# Patient Record
Sex: Male | Born: 1955 | Race: Black or African American | Hispanic: No | Marital: Married | Smoking: Former smoker
Health system: Southern US, Community
[De-identification: ages and names within clinical notes are randomized; demographics above are authoritative.]

## PROBLEM LIST (undated history)

## (undated) DIAGNOSIS — K219 Gastro-esophageal reflux disease without esophagitis: Secondary | ICD-10-CM

## (undated) DIAGNOSIS — M109 Gout, unspecified: Secondary | ICD-10-CM

## (undated) DIAGNOSIS — M791 Myalgia, unspecified site: Secondary | ICD-10-CM

## (undated) DIAGNOSIS — Z973 Presence of spectacles and contact lenses: Secondary | ICD-10-CM

## (undated) DIAGNOSIS — M3390 Dermatopolymyositis, unspecified, organ involvement unspecified: Secondary | ICD-10-CM

## (undated) DIAGNOSIS — E119 Type 2 diabetes mellitus without complications: Secondary | ICD-10-CM

## (undated) DIAGNOSIS — D573 Sickle-cell trait: Secondary | ICD-10-CM

## (undated) DIAGNOSIS — D649 Anemia, unspecified: Secondary | ICD-10-CM

## (undated) DIAGNOSIS — M3313 Other dermatomyositis without myopathy: Secondary | ICD-10-CM

## (undated) DIAGNOSIS — I10 Essential (primary) hypertension: Secondary | ICD-10-CM

## (undated) HISTORY — PX: SHOULDER SURGERY: SHX246

## (undated) HISTORY — PX: KNEE RECONSTRUCTION: SHX5883

## (undated) HISTORY — PX: VASECTOMY: SHX75

---

## 1999-12-29 ENCOUNTER — Encounter: Admission: RE | Admit: 1999-12-29 | Discharge: 2000-03-28 | Payer: Self-pay | Admitting: Internal Medicine

## 2000-04-28 ENCOUNTER — Encounter: Admission: RE | Admit: 2000-04-28 | Discharge: 2000-07-27 | Payer: Self-pay | Admitting: Internal Medicine

## 2001-06-21 ENCOUNTER — Encounter: Admission: RE | Admit: 2001-06-21 | Discharge: 2001-09-19 | Payer: Self-pay | Admitting: Occupational Medicine

## 2001-11-20 ENCOUNTER — Encounter: Payer: Self-pay | Admitting: Otolaryngology

## 2001-11-20 ENCOUNTER — Encounter: Admission: RE | Admit: 2001-11-20 | Discharge: 2001-11-20 | Payer: Self-pay | Admitting: Otolaryngology

## 2003-09-06 ENCOUNTER — Ambulatory Visit (HOSPITAL_COMMUNITY): Admission: RE | Admit: 2003-09-06 | Discharge: 2003-09-06 | Payer: Self-pay | Admitting: Gastroenterology

## 2008-06-27 ENCOUNTER — Emergency Department (HOSPITAL_COMMUNITY): Admission: EM | Admit: 2008-06-27 | Discharge: 2008-06-28 | Payer: Self-pay | Admitting: Emergency Medicine

## 2010-09-25 ENCOUNTER — Other Ambulatory Visit: Payer: Self-pay | Admitting: Neurosurgery

## 2010-10-07 ENCOUNTER — Other Ambulatory Visit: Payer: Self-pay | Admitting: Neurosurgery

## 2010-10-07 ENCOUNTER — Ambulatory Visit
Admission: RE | Admit: 2010-10-07 | Discharge: 2010-10-07 | Disposition: A | Discharge status: Home / Self Care | Payer: 59 | Source: Ambulatory Visit | Attending: Neurosurgery | Admitting: Neurosurgery

## 2010-10-07 DIAGNOSIS — M549 Dorsalgia, unspecified: Secondary | ICD-10-CM

## 2011-01-08 NOTE — Consult Note (Signed)
[place]. [facility]  Patient:    Derek Anthony, Derek Anthony                       MRN: [id number] Proc. Date: 12/29/99 Adm. Date:  [id number] Attending:  Garlan Fillers CC:         Lennon Alstrom. Felipa Eth, M.D.                          Consultation Report  PHYSICIAN REQUESTING CONSULTATION:  Ravi R. Felipa Eth, M.D.  REASON FOR CONSULTATION:  Numbness along the left great toe.  HISTORY OF PRESENT ILLNESS:  The patient is a 55 year old black male with a history of diabetes mellitus, type 2, since October 1998.  He notes his blood sugars are often in the 200 range.  He had a hemoglobin A1C tested in October 2000 that was 7.8%.  PAST MEDICAL HISTORY:  He has no history of peripheral neuropathy or retinopathy.  He also has a history of hypertension, hiatal hernia, osteoarthritis affecting both knees, gout, and pancreatitis.  PAST SURGICAL HISTORY:  Significant for right total knee reconstruction in 1982 and a 1986 vasectomy.  ALLERGIES:  PENICILLIN and SULFA.  MEDICATIONS: 1. Glucotrol XL 10 mg p.o. b.i.d. 2. Actos 30 mg p.o. q.d. 3. Celebrex 200 mg p.o. q.d. 4. Lisinopril 10 mg p.o. q.d. 5. Zyrtec 10 mg p.o. q.d. 6. Allopurinol 300 mg p.o. q.d. 7. Zantac 150 mg p.o. q.d. 8. Insulin Lente 50 units subcutaneously b.i.d. 9. Vasocon A eye drops p.r.n.  PHYSICAL EXAMINATION:  EXTREMITIES:  Both feet have fairly normal shape with the exception of mild bunion deformities bilaterally.  There was normal sensation to 5.07 g monofilament testing bilaterally.  The exception if a very small patch of skin on the dorsum of the left great toe that does not have monofilament sensation. The pulses are normal.  There is deep tenderness in the plantar fascia on the left.  There is no evidence of a compartment syndrome causing numbness of the left great toe.  He shows normal toe walk and heel walk, indicating intact motor function of the S1 and L5 nerve roots.  There is no evidence of  tarsal tunnel syndrome.  IMPRESSION: 1. Left foot pain of uncertain etiology, possibly due to early diabetic    neuropathy.  The numb area alternatively could be due to a softball injury    to his left shin approximately one year ago that could affect the sensory    enervation to this region. 2. Diabetes mellitus, type 2, under average control. 3. Hypertension. 4. Osteoarthritis of both knees.  PLAN: 1. He was advised to wear heel cups to minimize plantar fascia pain on    the left.  In addition, he will do plantar fascia stretches twice daily. 2. If conservative treatment for left-sided heel pain is ineffective, he    will consider cortisone injection in this region. 3. For the left great toe numbness and pain, he was started on Neurontin    300 mg p.o. q.h.s. 4. He will be seen in followup as needed [facility]. DD:  12/29/99 TD:  12/29/99 Job: [id number]

## 2011-01-08 NOTE — Op Note (Signed)
NAME:  Derek Anthony, Derek Anthony                          ACCOUNT NO.:  [id number]   MEDICAL RECORD NO.:  [id number]                   PATIENT TYPE:  AMB   LOCATION:  ENDO                                 FACILITY:  [facility]   PHYSICIAN:  Petra Kuba, M.D.                 DATE OF BIRTH:  15-May-1956   DATE OF PROCEDURE:  09/06/2003  DATE OF DISCHARGE:                                 OPERATIVE REPORT   PROCEDURE:  Colonoscopy.   INDICATION:  Family history of colon cancer in brother at a young age,  episodic right-sided abdominal pain, questionable etiology.  Consent was  signed after risks, benefits, methods, options thoroughly discussed in the  office.   MEDICINES USED:  1. Demerol 80.  2. Versed 8.   DESCRIPTION OF PROCEDURE:  Rectal inspection was pertinent for external  hemorrhoids.  Digital exam was negative.  The video colonoscope was  inserted, easily advanced to the level of the ileocecal valve.  Unfortunately, with the looping to advance to the cecal pole required  rolling him on his back and then on his right side.  The cecum was  identified by the appendiceal orifice and the ileocecal valve.  In fact, the  scope was inserted a short ways into the terminal ileum which was normal.  Photodocumentation was obtained.  The scope was slowly withdrawn.  Prep was  adequate.  There was some liquid stool that required washing and suctioning  but on slow withdrawal through the colon, no abnormalities were seen.  Specifically, no polyps, tumors, masses, diverticula, signs of colitis, etc.  Anorectal pull-through and retroflexion confirmed some small hemorrhoids.  The scope was reinserted a short ways up the left side of the colon; air was  suctioned, scope removed.  The patient tolerated the procedure well.  There  was no obvious immediate complication.   ENDOSCOPIC DIAGNOSES:  1. Internal-external hemorrhoids.  2. Otherwise, within normal limits to the terminal ileum.   PLAN:  1. Re-screen  in 5 years.  2. Yearly rectals with guaiacs per Dr. Felipa Eth.  3. Consider a CAT scan next if his pain continues just to be sure.  4. Might want to try an antispasmodic, but the pain seems to be so difficult     to describe, fairly rare, and short-lived, I doubt a medicine would     prevent these discomforts but happy to see back to further evaluate,     etc., as needed.                                               Petra Kuba, M.D.    MEM/MEDQ  D:  09/06/2003  T:  09/06/2003  Job:  [id number]   cc:   Larina Earthly, M.D.  [address]  [place]  [place] [address]  Fax: [phone]

## 2011-03-10 ENCOUNTER — Emergency Department (HOSPITAL_COMMUNITY)
Admission: EM | Admit: 2011-03-10 | Discharge: 2011-03-11 | Disposition: A | Discharge status: Home / Self Care | Payer: 59 | Attending: Emergency Medicine | Admitting: Emergency Medicine

## 2011-03-10 DIAGNOSIS — E119 Type 2 diabetes mellitus without complications: Secondary | ICD-10-CM | POA: Insufficient documentation

## 2011-03-10 DIAGNOSIS — Z794 Long term (current) use of insulin: Secondary | ICD-10-CM | POA: Insufficient documentation

## 2011-03-10 DIAGNOSIS — I10 Essential (primary) hypertension: Secondary | ICD-10-CM | POA: Insufficient documentation

## 2011-03-10 DIAGNOSIS — T63461A Toxic effect of venom of wasps, accidental (unintentional), initial encounter: Secondary | ICD-10-CM | POA: Insufficient documentation

## 2011-03-10 DIAGNOSIS — R51 Headache: Secondary | ICD-10-CM | POA: Insufficient documentation

## 2011-03-10 DIAGNOSIS — M129 Arthropathy, unspecified: Secondary | ICD-10-CM | POA: Insufficient documentation

## 2011-03-10 DIAGNOSIS — T6391XA Toxic effect of contact with unspecified venomous animal, accidental (unintentional), initial encounter: Secondary | ICD-10-CM | POA: Insufficient documentation

## 2011-03-10 DIAGNOSIS — M79609 Pain in unspecified limb: Secondary | ICD-10-CM | POA: Insufficient documentation

## 2011-03-10 DIAGNOSIS — Z79899 Other long term (current) drug therapy: Secondary | ICD-10-CM | POA: Insufficient documentation

## 2011-03-10 LAB — GLUCOSE, CAPILLARY: Glucose-Capillary: 246 mg/dL — ABNORMAL HIGH (ref 70–99)

## 2013-04-25 ENCOUNTER — Other Ambulatory Visit: Payer: Self-pay | Admitting: Neurosurgery

## 2013-04-25 DIAGNOSIS — M545 Low back pain: Secondary | ICD-10-CM

## 2013-04-25 DIAGNOSIS — M25552 Pain in left hip: Secondary | ICD-10-CM

## 2013-05-02 ENCOUNTER — Other Ambulatory Visit: Payer: 59

## 2013-05-19 ENCOUNTER — Other Ambulatory Visit: Payer: 59

## 2013-05-19 ENCOUNTER — Ambulatory Visit
Admission: RE | Admit: 2013-05-19 | Discharge: 2013-05-19 | Disposition: A | Discharge status: Home / Self Care | Payer: 59 | Source: Ambulatory Visit | Attending: Neurosurgery | Admitting: Neurosurgery

## 2013-05-19 DIAGNOSIS — M545 Low back pain: Secondary | ICD-10-CM

## 2013-05-19 DIAGNOSIS — M25552 Pain in left hip: Secondary | ICD-10-CM

## 2013-05-21 ENCOUNTER — Emergency Department (HOSPITAL_COMMUNITY)
Admission: EM | Admit: 2013-05-21 | Discharge: 2013-05-21 | Disposition: A | Discharge status: Home / Self Care | Payer: 59 | Attending: Emergency Medicine | Admitting: Emergency Medicine

## 2013-05-21 ENCOUNTER — Encounter (HOSPITAL_COMMUNITY): Payer: Self-pay | Admitting: Nurse Practitioner

## 2013-05-21 DIAGNOSIS — R05 Cough: Secondary | ICD-10-CM | POA: Insufficient documentation

## 2013-05-21 DIAGNOSIS — F438 Other reactions to severe stress: Secondary | ICD-10-CM | POA: Insufficient documentation

## 2013-05-21 DIAGNOSIS — Z794 Long term (current) use of insulin: Secondary | ICD-10-CM | POA: Insufficient documentation

## 2013-05-21 DIAGNOSIS — R739 Hyperglycemia, unspecified: Secondary | ICD-10-CM

## 2013-05-21 DIAGNOSIS — Z882 Allergy status to sulfonamides status: Secondary | ICD-10-CM | POA: Insufficient documentation

## 2013-05-21 DIAGNOSIS — Z7982 Long term (current) use of aspirin: Secondary | ICD-10-CM | POA: Insufficient documentation

## 2013-05-21 DIAGNOSIS — N289 Disorder of kidney and ureter, unspecified: Secondary | ICD-10-CM | POA: Insufficient documentation

## 2013-05-21 DIAGNOSIS — Z79899 Other long term (current) drug therapy: Secondary | ICD-10-CM | POA: Insufficient documentation

## 2013-05-21 DIAGNOSIS — I10 Essential (primary) hypertension: Secondary | ICD-10-CM | POA: Insufficient documentation

## 2013-05-21 DIAGNOSIS — R059 Cough, unspecified: Secondary | ICD-10-CM | POA: Insufficient documentation

## 2013-05-21 DIAGNOSIS — Z880 Allergy status to penicillin: Secondary | ICD-10-CM | POA: Insufficient documentation

## 2013-05-21 DIAGNOSIS — E119 Type 2 diabetes mellitus without complications: Secondary | ICD-10-CM | POA: Insufficient documentation

## 2013-05-21 DIAGNOSIS — R109 Unspecified abdominal pain: Secondary | ICD-10-CM | POA: Insufficient documentation

## 2013-05-21 DIAGNOSIS — F4389 Other reactions to severe stress: Secondary | ICD-10-CM | POA: Insufficient documentation

## 2013-05-21 HISTORY — DX: Type 2 diabetes mellitus without complications: E11.9

## 2013-05-21 HISTORY — DX: Essential (primary) hypertension: I10

## 2013-05-21 LAB — COMPREHENSIVE METABOLIC PANEL
ALT: 19 U/L (ref 0–53)
AST: 19 U/L (ref 0–37)
Alkaline Phosphatase: 110 U/L (ref 39–117)
CO2: 27 mEq/L (ref 19–32)
Calcium: 9.4 mg/dL (ref 8.4–10.5)
Chloride: 96 mEq/L (ref 96–112)
GFR calc Af Amer: 40 mL/min — ABNORMAL LOW (ref >90)
GFR calc non Af Amer: 35 mL/min — ABNORMAL LOW (ref >90)
Sodium: 136 mEq/L (ref 135–145)
Total Bilirubin: 0.5 mg/dL (ref 0.3–1.2)

## 2013-05-21 LAB — GLUCOSE, CAPILLARY

## 2013-05-21 LAB — CBC
Hemoglobin: 14.1 g/dL (ref 13.0–17.0)
MCH: 27.2 pg (ref 26.0–34.0)
MCV: 79 fL (ref 78.0–100.0)
Platelets: 288 10*3/uL (ref 150–400)
RBC: 5.18 MIL/uL (ref 4.22–5.81)
RDW: 13.1 % (ref 11.5–15.5)
WBC: 11.6 10*3/uL — ABNORMAL HIGH (ref 4.0–10.5)

## 2013-05-21 LAB — URINALYSIS, ROUTINE W REFLEX MICROSCOPIC
Hgb urine dipstick: NEGATIVE
Protein, ur: NEGATIVE mg/dL
Specific Gravity, Urine: 1.011 (ref 1.005–1.030)
Urobilinogen, UA: 0.2 mg/dL (ref 0.0–1.0)

## 2013-05-21 NOTE — ED Notes (Signed)
Pt reports he has been unable to control blood sugar for past month. Pt is on oral and SQ medications. Today his blood sugar was >400 on home monitor. Pt reports he is under a lot of stress and he thinks that is why. C/o blurred vision, weakness, frequent urination for past weeks. A&ox4, resp /u

## 2013-05-21 NOTE — ED Provider Notes (Signed)
CSN: [id number]     Arrival date & time 05/21/13  1713 History   First MD Initiated Contact with Patient 05/21/13 2151     Chief Complaint  Patient presents with  . Hyperglycemia   (Consider location/radiation/quality/duration/timing/severity/associated sxs/prior Treatment) HPI History provided by pt.   Pt presents w/ hyperglycemia since last night.  Monitor read "ketones" last night, 390 after fasting this morning, and >400 at 1pm after eating a bowl of oatmeal and coffee with creamer and taking novolog a few hours prior.  Cbg checked daily, is typically in 140s and most recent A1C was ~7.0 3-4 months ago.   Has been compliant w/ novolog and actos but missed his dose of glipizide 4 and 5 days ago.  No recent infectious symptoms, w/ exception of mild cough and chest congestion that is typical and attributed to allergies.  Denies fever, CP/SOB, abd pain, N/V/D, urinary sx.  Has been under a lot of stress at work recently.  Past Medical History  Diagnosis Date  . Diabetes mellitus without complication   . Hypertension    Past Surgical History  Procedure Laterality Date  . Shoulder surgery    . Knee reconstruction    . Vasectomy     History reviewed. No pertinent family history. History  Substance Use Topics  . Smoking status: Never Smoker   . Smokeless tobacco: Not on file  . Alcohol Use: No    Review of Systems  All other systems reviewed and are negative.    Allergies  Penicillins and Sulfa antibiotics  Home Medications   Current Outpatient Rx  Name  Route  Sig  Dispense  Refill  . allopurinol (ZYLOPRIM) 300 MG tablet   Oral   Take 300 mg by mouth daily.         Marland Kitchen aspirin EC 81 MG tablet   Oral   Take 81 mg by mouth daily.         . cetirizine (ZYRTEC) 10 MG tablet   Oral   Take 10 mg by mouth every morning.         . diazepam (VALIUM) 5 MG tablet   Oral   Take 5 mg by mouth every 8 (eight) hours as needed for anxiety or sleep.         Marland Kitchen gabapentin  (NEURONTIN) 600 MG tablet   Oral   Take 600 mg by mouth 2 (two) times daily.         Marland Kitchen glipiZIDE (GLUCOTROL) 10 MG tablet   Oral   Take 10 mg by mouth daily.         Marland Kitchen HYDROcodone-acetaminophen (NORCO) 10-325 MG per tablet   Oral   Take 1 tablet by mouth every 6 (six) hours as needed for pain.         Marland Kitchen insulin aspart protamine- aspart (NOVOLOG MIX 70/30) (70-30) 100 UNIT/ML injection   Subcutaneous   Inject 20-30 Units into the skin 2 (two) times daily. 30 units in the morning and 20 units at bedtime.         . montelukast (SINGULAIR) 10 MG tablet   Oral   Take 10 mg by mouth at bedtime.         Marland Kitchen olmesartan-hydrochlorothiazide (BENICAR HCT) 40-12.5 MG per tablet   Oral   Take 1 tablet by mouth daily.         . pioglitazone (ACTOS) 45 MG tablet   Oral   Take 45 mg by mouth daily.         Marland Kitchen  ranitidine (ZANTAC) 150 MG tablet   Oral   Take 150 mg by mouth daily.         . simvastatin (ZOCOR) 80 MG tablet   Oral   Take 80 mg by mouth at bedtime.         . traMADol (ULTRAM) 50 MG tablet   Oral   Take 50 mg by mouth every 6 (six) hours as needed for pain.          BP 111/82  Pulse 86  Temp(Src) 98 F (36.7 C) (Oral)  Resp 16  Ht 6\' 1"  (1.854 m)  Wt 255 lb (115.667 kg)  BMI 33.65 kg/m2  SpO2 95% Physical Exam  Nursing note and vitals reviewed. Constitutional: He is oriented to person, place, and time. He appears well-developed and well-nourished. No distress.  HENT:  Head: Normocephalic and atraumatic.  Eyes:  Normal appearance  Neck: Normal range of motion.  Cardiovascular: Normal rate and regular rhythm.   Pulmonary/Chest: Effort normal and breath sounds normal. No respiratory distress.  Abdominal: Soft. Bowel sounds are normal. He exhibits no distension and no mass. There is no rebound and no guarding.  Mild tenderness across upper abd  Genitourinary:  No CVA tenderness  Musculoskeletal: Normal range of motion.  Neurological: He is  alert and oriented to person, place, and time.  Skin: Skin is warm and dry. No rash noted.  Psychiatric: He has a normal mood and affect. His behavior is normal.    ED Course  Procedures (including critical care time) Labs Review Labs Reviewed  CBC - Abnormal; Notable for the following:    WBC 11.6 (*)    All other components within normal limits  COMPREHENSIVE METABOLIC PANEL - Abnormal; Notable for the following:    Glucose, Bld 277 (*)    BUN 28 (*)    Creatinine, Ser 2.00 (*)    GFR calc non Af Amer 35 (*)    GFR calc Af Amer 40 (*)    All other components within normal limits  GLUCOSE, CAPILLARY - Abnormal; Notable for the following:    Glucose-Capillary 263 (*)    All other components within normal limits  GLUCOSE, CAPILLARY - Abnormal; Notable for the following:    Glucose-Capillary 118 (*)    All other components within normal limits  URINALYSIS, ROUTINE W REFLEX MICROSCOPIC   Imaging Review No results found.  MDM   1. Hyperglycemia   2. Renal insufficiency    57yo diabetic M presents w/ hyperglycemia.  Cbg 263 upon arrival; was in 300-400s at home which is atypical.  Missed glipize two days in a row at the end of last week and has been under a lot of stress at work.  No recent infectious symptoms; U/A negative for infection.  He is not acidotic and labs otherwise sig for elevated creatinine.  Discussed all results with patient.  He is followed by Dr. Felipa Eth, reports that he has been diagnosed w/ "mild renal failure" and that he his kidney function is being monitored.  Advised him to continue all medications as prescribed and contact Dr. Vicente Males office tomorrow for f/u.  10:36 PM     Otilio Miu, PA-C 05/21/13 2238

## 2013-05-21 NOTE — ED Notes (Signed)
Pt to ED for evaluation of fluctuation blood sugars over the past 6-8 weeks.  Pt states his CBG has been higher than he usually runs- blurred vision, headaches, and frequent urination associated with symptoms.  Last night pt blood sugar was too high for machine to read and has been reading high today as well.  Pt admits to being complaint with medications and insulin.

## 2013-05-23 NOTE — ED Provider Notes (Signed)
Medical screening examination/treatment/procedure(s) were performed by non-physician practitioner and as supervising physician I was immediately available for consultation/collaboration.  Hurman Horn, MD 05/23/13 [phone]

## 2013-06-22 ENCOUNTER — Other Ambulatory Visit: Payer: Self-pay | Admitting: Internal Medicine

## 2013-06-26 ENCOUNTER — Ambulatory Visit
Admission: RE | Admit: 2013-06-26 | Discharge: 2013-06-26 | Disposition: A | Discharge status: Home / Self Care | Payer: 59 | Source: Ambulatory Visit | Attending: Internal Medicine | Admitting: Internal Medicine

## 2014-10-26 ENCOUNTER — Emergency Department (HOSPITAL_COMMUNITY): Payer: 59

## 2014-10-26 ENCOUNTER — Inpatient Hospital Stay (HOSPITAL_COMMUNITY): Payer: 59

## 2014-10-26 ENCOUNTER — Encounter (HOSPITAL_COMMUNITY): Payer: Self-pay | Admitting: Physical Medicine and Rehabilitation

## 2014-10-26 ENCOUNTER — Inpatient Hospital Stay (HOSPITAL_COMMUNITY)
Admission: EM | Admit: 2014-10-26 | Discharge: 2014-11-01 | DRG: 607 | Disposition: A | Discharge status: Home / Self Care | Payer: 59 | Attending: Internal Medicine | Admitting: Internal Medicine

## 2014-10-26 DIAGNOSIS — R682 Dry mouth, unspecified: Secondary | ICD-10-CM | POA: Diagnosis not present

## 2014-10-26 DIAGNOSIS — M25512 Pain in left shoulder: Secondary | ICD-10-CM | POA: Diagnosis present

## 2014-10-26 DIAGNOSIS — J029 Acute pharyngitis, unspecified: Secondary | ICD-10-CM | POA: Diagnosis present

## 2014-10-26 DIAGNOSIS — I951 Orthostatic hypotension: Secondary | ICD-10-CM | POA: Diagnosis not present

## 2014-10-26 DIAGNOSIS — R042 Hemoptysis: Secondary | ICD-10-CM

## 2014-10-26 DIAGNOSIS — Z9852 Vasectomy status: Secondary | ICD-10-CM | POA: Diagnosis not present

## 2014-10-26 DIAGNOSIS — E1165 Type 2 diabetes mellitus with hyperglycemia: Secondary | ICD-10-CM | POA: Diagnosis present

## 2014-10-26 DIAGNOSIS — R509 Fever, unspecified: Secondary | ICD-10-CM | POA: Diagnosis present

## 2014-10-26 DIAGNOSIS — Z794 Long term (current) use of insulin: Secondary | ICD-10-CM | POA: Diagnosis not present

## 2014-10-26 DIAGNOSIS — R05 Cough: Secondary | ICD-10-CM | POA: Diagnosis present

## 2014-10-26 DIAGNOSIS — D573 Sickle-cell trait: Secondary | ICD-10-CM | POA: Diagnosis present

## 2014-10-26 DIAGNOSIS — E119 Type 2 diabetes mellitus without complications: Secondary | ICD-10-CM

## 2014-10-26 DIAGNOSIS — M25531 Pain in right wrist: Secondary | ICD-10-CM | POA: Diagnosis not present

## 2014-10-26 DIAGNOSIS — Z87891 Personal history of nicotine dependence: Secondary | ICD-10-CM

## 2014-10-26 DIAGNOSIS — R630 Anorexia: Secondary | ICD-10-CM | POA: Diagnosis present

## 2014-10-26 DIAGNOSIS — K219 Gastro-esophageal reflux disease without esophagitis: Secondary | ICD-10-CM | POA: Diagnosis present

## 2014-10-26 DIAGNOSIS — E1310 Other specified diabetes mellitus with ketoacidosis without coma: Secondary | ICD-10-CM | POA: Diagnosis not present

## 2014-10-26 DIAGNOSIS — M1990 Unspecified osteoarthritis, unspecified site: Secondary | ICD-10-CM | POA: Diagnosis present

## 2014-10-26 DIAGNOSIS — R0789 Other chest pain: Secondary | ICD-10-CM | POA: Diagnosis not present

## 2014-10-26 DIAGNOSIS — Z880 Allergy status to penicillin: Secondary | ICD-10-CM

## 2014-10-26 DIAGNOSIS — R0902 Hypoxemia: Secondary | ICD-10-CM | POA: Diagnosis not present

## 2014-10-26 DIAGNOSIS — R21 Rash and other nonspecific skin eruption: Secondary | ICD-10-CM | POA: Diagnosis present

## 2014-10-26 DIAGNOSIS — M25511 Pain in right shoulder: Secondary | ICD-10-CM | POA: Diagnosis present

## 2014-10-26 DIAGNOSIS — R634 Abnormal weight loss: Secondary | ICD-10-CM

## 2014-10-26 DIAGNOSIS — M2550 Pain in unspecified joint: Secondary | ICD-10-CM | POA: Insufficient documentation

## 2014-10-26 DIAGNOSIS — D649 Anemia, unspecified: Secondary | ICD-10-CM | POA: Diagnosis not present

## 2014-10-26 DIAGNOSIS — I10 Essential (primary) hypertension: Secondary | ICD-10-CM

## 2014-10-26 DIAGNOSIS — R059 Cough, unspecified: Secondary | ICD-10-CM

## 2014-10-26 DIAGNOSIS — M109 Gout, unspecified: Secondary | ICD-10-CM | POA: Diagnosis present

## 2014-10-26 DIAGNOSIS — Z882 Allergy status to sulfonamides status: Secondary | ICD-10-CM | POA: Diagnosis not present

## 2014-10-26 DIAGNOSIS — M25532 Pain in left wrist: Secondary | ICD-10-CM | POA: Diagnosis not present

## 2014-10-26 HISTORY — DX: Gastro-esophageal reflux disease without esophagitis: K21.9

## 2014-10-26 HISTORY — DX: Sickle-cell trait: D57.3

## 2014-10-26 HISTORY — DX: Gout, unspecified: M10.9

## 2014-10-26 HISTORY — DX: Anemia, unspecified: D64.9

## 2014-10-26 LAB — COMPREHENSIVE METABOLIC PANEL
ALBUMIN: 2.9 g/dL — AB (ref 3.5–5.2)
ALT: 24 U/L (ref 0–53)
AST: 35 U/L (ref 0–37)
Alkaline Phosphatase: 56 U/L (ref 39–117)
Anion gap: 4 — ABNORMAL LOW (ref 5–15)
BILIRUBIN TOTAL: 1 mg/dL (ref 0.3–1.2)
BUN: 10 mg/dL (ref 6–23)
CO2: 32 mmol/L (ref 19–32)
CREATININE: 1.3 mg/dL (ref 0.50–1.35)
Calcium: 8.8 mg/dL (ref 8.4–10.5)
Chloride: 106 mmol/L (ref 96–112)
GFR calc Af Amer: 67 mL/min — ABNORMAL LOW (ref >90)
GFR calc non Af Amer: 58 mL/min — ABNORMAL LOW (ref >90)
GLUCOSE: 162 mg/dL — AB (ref 70–99)
Potassium: 4.1 mmol/L (ref 3.5–5.1)
SODIUM: 142 mmol/L (ref 135–145)
Total Protein: 6.8 g/dL (ref 6.0–8.3)

## 2014-10-26 LAB — CBC WITH DIFFERENTIAL/PLATELET
BASOS PCT: 0 % (ref 0–1)
Basophils Absolute: 0 10*3/uL (ref 0.0–0.1)
Eosinophils Absolute: 0 10*3/uL (ref 0.0–0.7)
Eosinophils Relative: 0 % (ref 0–5)
HCT: 39.5 % (ref 39.0–52.0)
HEMOGLOBIN: 12.9 g/dL — AB (ref 13.0–17.0)
LYMPHS ABS: 0.9 10*3/uL (ref 0.7–4.0)
Lymphocytes Relative: 13 % (ref 12–46)
MCH: 25.9 pg — AB (ref 26.0–34.0)
MCHC: 32.7 g/dL (ref 30.0–36.0)
MCV: 79.2 fL (ref 78.0–100.0)
Monocytes Absolute: 0.5 10*3/uL (ref 0.1–1.0)
Monocytes Relative: 7 % (ref 3–12)
NEUTROS PCT: 80 % — AB (ref 43–77)
Neutro Abs: 5.8 10*3/uL (ref 1.7–7.7)
PLATELETS: 281 10*3/uL (ref 150–400)
RBC: 4.99 MIL/uL (ref 4.22–5.81)
RDW: 13.8 % (ref 11.5–15.5)
WBC: 7.3 10*3/uL (ref 4.0–10.5)

## 2014-10-26 LAB — PROTIME-INR
INR: 1.03 (ref 0.00–1.49)
PROTHROMBIN TIME: 13.6 s (ref 11.6–15.2)

## 2014-10-26 LAB — RAPID HIV SCREEN (WH-MAU): SUDS RAPID HIV SCREEN: NONREACTIVE

## 2014-10-26 LAB — SEDIMENTATION RATE: Sed Rate: 60 mm/hr — ABNORMAL HIGH (ref 0–16)

## 2014-10-26 LAB — CK: Total CK: 227 U/L (ref 7–232)

## 2014-10-26 LAB — LACTATE DEHYDROGENASE: LDH: 296 U/L — ABNORMAL HIGH (ref 94–250)

## 2014-10-26 MED ORDER — IOHEXOL 300 MG/ML  SOLN
100.0000 mL | Freq: Once | INTRAMUSCULAR | Status: AC | PRN
  Administered 2014-10-26: 100 mL via INTRAVENOUS

## 2014-10-26 MED ORDER — ACETAMINOPHEN 500 MG PO TABS
1000.0000 mg | ORAL_TABLET | Freq: Once | ORAL | Status: AC
  Administered 2014-10-26: 1000 mg via ORAL
  Filled 2014-10-26: qty 2

## 2014-10-26 MED ORDER — MORPHINE SULFATE 4 MG/ML IJ SOLN
4.0000 mg | Freq: Once | INTRAMUSCULAR | Status: AC
  Administered 2014-10-26: 4 mg via INTRAVENOUS
  Filled 2014-10-26: qty 1

## 2014-10-26 MED ORDER — SODIUM CHLORIDE 0.9 % IV BOLUS (SEPSIS)
1000.0000 mL | Freq: Once | INTRAVENOUS | Status: AC
  Administered 2014-10-26: 1000 mL via INTRAVENOUS

## 2014-10-26 MED ORDER — IOHEXOL 300 MG/ML  SOLN
25.0000 mL | Freq: Once | INTRAMUSCULAR | Status: AC | PRN
  Administered 2014-10-26: 25 mL via ORAL

## 2014-10-26 NOTE — Progress Notes (Signed)
Partial report of patient received from [facility] in ED. Pt to go to CT and RN would return call.

## 2014-10-26 NOTE — ED Notes (Signed)
Pt presents to department for evaluation of fever, generalized body aches and blisters to hands/feet. Ongoing for several weeks. Pt is alert and oriented x4.

## 2014-10-26 NOTE — ED Provider Notes (Signed)
Patient presented to the ER with rash, malaise, body aches. Patient has been experiencing fever and chills. Symptoms have been ongoing for several weeks. He saw an allergist for this, tells me that the workup is ongoing. Family reports that he has been significantly change. He is normally very active, but has been simply lying around all day.  Face to face Exam: HEENT - PERRLA Lungs - CTAB Heart - RRR, no M/R/G Abd - S/NT/ND Neuro - alert, oriented x3 Skin - nonspecific erythematous rash on palms of hands and soles of feet  Plan: Basic labs will be obtained. Differential diagnosis would include viral exanthem, direct skin infection with bacteria as well as virus, specifically consider secondary syphilis. Would also consider vasculitis.   Gilda Creasehristopher J. Pollina, MD 10/26/14 [phone]

## 2014-10-26 NOTE — H&P (Addendum)
Derek Anthony is an 59 y.o. male.    Dr. Elsworth Soho (pcp, [facility]) Dr. Eliot Ford (allerghist)  Chief Complaint: rash HPI: 59 yo male with dm2, htn, gout, apparently c/o blisters on hands for the past 4-5 weeks.  Started on prednisone about 2 weeks ago.  Pt also notes blisters on the bilateral elbows.  + fever for the past couple of weeks.  Pt had dental work about 5-6 weeks ago.  Pt states that fever has been about 101.  Apparently ED consult ID and gave recommendations to order RPR, ESR, and rocky mountain spotted fever titer.... See ED note for details.  Pt denies any new detergent, lotion or soap.  Pt denies new medications prior to the rash.  Pt will be admitted for fever unclear source r/o endocarditis.  Past Medical History  Diagnosis Date  . Diabetes mellitus without complication   . Hypertension   . GERD (gastroesophageal reflux disease)   . Anemia   . Sickle cell trait   . Gout     Past Surgical History  Procedure Laterality Date  . Shoulder surgery    . Knee reconstruction Right   . Vasectomy      Family History  Problem Relation Age of Onset  . Hypertension Mother   . Diabetes Mother   . Hypertension Father    Social History:  reports that he has never smoked. He does not have any smokeless tobacco history on file. He reports that he does not drink alcohol or use illicit drugs.  Allergies:  Allergies  Allergen Reactions  . Sulfa Antibiotics Itching and Rash    Burning also  . Penicillins Itching and Rash   Medications reviewed  (Not in a hospital admission)  Results for orders placed or performed during the hospital encounter of 10/26/14 (from the past 48 hour(s))  CBC with Differential     Status: Abnormal   Collection Time: 10/26/14  2:08 PM  Result Value Ref Range   WBC 7.3 4.0 - 10.5 K/uL   RBC 4.99 4.22 - 5.81 MIL/uL   Hemoglobin 12.9 (L) 13.0 - 17.0 g/dL   HCT 39.5 39.0 - 52.0 %   MCV 79.2 78.0 - 100.0 fL   MCH 25.9 (L) 26.0 - 34.0 pg   MCHC  32.7 30.0 - 36.0 g/dL   RDW 13.8 11.5 - 15.5 %   Platelets 281 150 - 400 K/uL   Neutrophils Relative % 80 (H) 43 - 77 %   Neutro Abs 5.8 1.7 - 7.7 K/uL   Lymphocytes Relative 13 12 - 46 %   Lymphs Abs 0.9 0.7 - 4.0 K/uL   Monocytes Relative 7 3 - 12 %   Monocytes Absolute 0.5 0.1 - 1.0 K/uL   Eosinophils Relative 0 0 - 5 %   Eosinophils Absolute 0.0 0.0 - 0.7 K/uL   Basophils Relative 0 0 - 1 %   Basophils Absolute 0.0 0.0 - 0.1 K/uL  Comprehensive metabolic panel     Status: Abnormal   Collection Time: 10/26/14  2:08 PM  Result Value Ref Range   Sodium 142 135 - 145 mmol/L   Potassium 4.1 3.5 - 5.1 mmol/L   Chloride 106 96 - 112 mmol/L   CO2 32 19 - 32 mmol/L   Glucose, Bld 162 (H) 70 - 99 mg/dL   BUN 10 6 - 23 mg/dL   Creatinine, Ser 1.30 0.50 - 1.35 mg/dL   Calcium 8.8 8.4 - 10.5 mg/dL   Total Protein 6.8  6.0 - 8.3 g/dL   Albumin 2.9 (L) 3.5 - 5.2 g/dL   AST 35 0 - 37 U/L   ALT 24 0 - 53 U/L   Alkaline Phosphatase 56 39 - 117 U/L   Total Bilirubin 1.0 0.3 - 1.2 mg/dL   GFR calc non Af Amer 58 (L) >90 mL/min   GFR calc Af Amer 67 (L) >90 mL/min    Comment: (NOTE) The eGFR has been calculated using the CKD EPI equation. This calculation has not been validated in all clinical situations. eGFR's persistently <90 mL/min signify possible Chronic Kidney Disease.    Anion gap 4 (L) 5 - 15  Sedimentation rate     Status: Abnormal   Collection Time: 10/26/14  2:08 PM  Result Value Ref Range   Sed Rate 60 (H) 0 - 16 mm/hr  Rapid HIV screen [facility])     Status: None   Collection Time: 10/26/14  7:16 PM  Result Value Ref Range   Rapid HIV Screen NON REACTIVE NON REACTIVE  Protime-INR     Status: None   Collection Time: 10/26/14  7:16 PM  Result Value Ref Range   Prothrombin Time 13.6 11.6 - 15.2 seconds   INR 1.03 0.00 - 1.49   Dg Chest 2 View  10/26/2014   CLINICAL DATA:  Fever.  Generalized body aches.  Diabetes.  Cough.  EXAM: CHEST  2 VIEW  COMPARISON:  None.  FINDINGS:  Lateral view degraded by patient arm position and mild motion. Midline trachea. Borderline cardiomegaly. Mediastinal contours otherwise within normal limits. No pleural effusion or pneumothorax. Clear lungs.  IMPRESSION: Borderline cardiomegaly, without acute disease.  Degraded lateral view.   Electronically Signed   By: Abigail Miyamoto M.D.   On: 10/26/2014 17:20    Review of Systems  Constitutional: Positive for fever and chills. Negative for weight loss, malaise/fatigue and diaphoresis.  HENT: Negative for congestion, ear discharge, ear pain, hearing loss, nosebleeds, sore throat and tinnitus.   Eyes: Negative for blurred vision, double vision, photophobia, pain, discharge and redness.  Respiratory: Positive for cough. Negative for hemoptysis, sputum production, shortness of breath, wheezing and stridor.   Cardiovascular: Negative for chest pain, palpitations, orthopnea, claudication, leg swelling and PND.  Gastrointestinal: Negative for heartburn, nausea, vomiting, abdominal pain, diarrhea, constipation, blood in stool and melena.  Genitourinary: Negative for dysuria, urgency, frequency, hematuria and flank pain.  Musculoskeletal: Positive for myalgias, back pain and joint pain. Negative for falls and neck pain.  Skin: Positive for rash. Negative for itching.  Neurological: Negative for dizziness, tingling, tremors, sensory change, speech change, focal weakness, seizures, loss of consciousness, weakness and headaches.  Endo/Heme/Allergies: Negative for environmental allergies and polydipsia. Does not bruise/bleed easily.  Psychiatric/Behavioral: Negative for depression, suicidal ideas, hallucinations, memory loss and substance abuse. The patient is not nervous/anxious and does not have insomnia.     Blood pressure 108/79, pulse 70, temperature 99 F (37.2 C), temperature source Oral, resp. rate 16, height $RemoveBe'6\' 2"'ipfSfoFGa$  (1.88 m), weight 104.327 kg (230 lb), SpO2 92 %. Physical Exam  Constitutional: He  is oriented to person, place, and time. He appears well-developed and well-nourished.  HENT:  Head: Normocephalic and atraumatic.  Mouth/Throat: No oropharyngeal exudate.  Eyes: Conjunctivae and EOM are normal. Pupils are equal, round, and reactive to light. No scleral icterus.  Neck: Normal range of motion. Neck supple. No JVD present. No tracheal deviation present. No thyromegaly present.  Cardiovascular: Normal rate and regular rhythm.  Exam reveals no gallop  and no friction rub.   No murmur heard. Respiratory: Effort normal and breath sounds normal. No respiratory distress. He has no wheezes. He has no rales. He exhibits no tenderness.  GI: Soft. Bowel sounds are normal. He exhibits no distension and no mass. There is no tenderness. There is no rebound and no guarding.  Musculoskeletal: Normal range of motion. He exhibits no edema or tenderness.  Lymphadenopathy:    He has no cervical adenopathy.  Neurological: He is alert and oriented to person, place, and time. He has normal reflexes. He displays normal reflexes. No cranial nerve deficit. He exhibits normal muscle tone. Coordination normal.  Skin: Skin is warm and dry. Rash noted. No erythema. No pallor.  + papules on the fingers and palm  Psychiatric: He has a normal mood and affect. His behavior is normal. Judgment and thought content normal.     Assessment/Plan Rash  ? Endocarditis ? vasculitis Check esr, crp, ana, rpr, rocky mountain spotted fever titer, lyme titer, hiv, hepatitis panel IgA, celiac panel, dermatitis herpetiformis  Fever Blood culture x2,  Check esr, check cardiac echo Start on empiric vanco, levaquin for now after blood culture obtained  Anemia Check cbc in am Check spep, upep  Hyperglycemia Check hga1c    Jani Gravel 10/26/2014, 10:39 PM

## 2014-10-26 NOTE — ED Provider Notes (Signed)
CSN: [id number]     Arrival date & time 10/26/14  1354 History   First MD Initiated Contact with Patient 10/26/14 1506     Chief Complaint  Patient presents with  . Generalized Body Aches  . Fever     (Consider location/radiation/quality/duration/timing/severity/associated sxs/prior Treatment) The history is provided by the patient and medical records. No language interpreter was used.     Derek Anthony is a 59 y.o. male  with a hx of IDDM, HTN, arthritis, gout presents to the Emergency Department complaining of gradual, persistent, progressively worsening entire body pain onset 5 weeks ago. Pt reports his symptoms began approx 5 weeks ago with cough and body aches.  He reports the rash began 4 weeks ago.  He has been Dr. Filbert Schilder (an allergist) has been testing for the rash.  He reports his intense body pain is what prompted the ER visit.  He reports that he also feels dehydrated.  He reports a significantly decreased appetite.  He reports body pain is much worse with palpation of any part of the body.  Pt reports taking tramadol for many years to treat his arthritis pain, but this is not helping.  Pt reports fevers at home, no higher than 100.5 which improved with the ibuprofen.  Associated symptoms include weight loss (25 lbs in the last 3 mos), night sweates, hemoptysis.  Pt reports travel to [place] in Oct 2015.  Pt reports hx of [place]; denies a Hx of TB.  Denies hx of chancre.  Reports he is UTD on all vaccines.  Pt reports wife has the coughing but without other symptoms and no hemoptysis.  No one in the home is a smoker.  Nothing makes his symptoms better or worse.  Pt denies headache, neck pain, chest pain, SOB, abd pain, N/V/D, weakness, dizziness, syncope, dysuria.     PCP: [facility]  Past Medical History  Diagnosis Date  . Diabetes mellitus without complication   . Hypertension    Past Surgical History  Procedure Laterality Date  . Shoulder  surgery    . Knee reconstruction    . Vasectomy     History reviewed. No pertinent family history. History  Substance Use Topics  . Smoking status: Never Smoker   . Smokeless tobacco: Not on file  . Alcohol Use: No    Review of Systems  Constitutional: Positive for fever, appetite change and fatigue. Negative for diaphoresis and unexpected weight change.  HENT: Negative for mouth sores.   Eyes: Negative for visual disturbance.  Respiratory: Negative for cough, chest tightness, shortness of breath and wheezing.   Cardiovascular: Negative for chest pain.  Gastrointestinal: Negative for nausea, vomiting, abdominal pain, diarrhea and constipation.  Endocrine: Negative for polydipsia, polyphagia and polyuria.  Genitourinary: Negative for dysuria, urgency, frequency and hematuria.  Musculoskeletal: Positive for myalgias and arthralgias. Negative for back pain and neck stiffness.  Skin: Positive for rash.  Allergic/Immunologic: Negative for immunocompromised state.  Neurological: Negative for syncope, light-headedness and headaches.  Hematological: Does not bruise/bleed easily.  Psychiatric/Behavioral: Negative for sleep disturbance. The patient is not nervous/anxious.       Allergies  Sulfa antibiotics and Penicillins  Home Medications   Prior to Admission medications   Medication Sig Start Date End Date Taking? Authorizing Provider  allopurinol (ZYLOPRIM) 300 MG tablet Take 300 mg by mouth daily.   Yes Historical Provider, MD  aspirin EC 81 MG tablet Take 81 mg by mouth daily.   Yes  Historical Provider, MD  cetirizine (ZYRTEC) 10 MG tablet Take 10 mg by mouth every morning.    Yes Historical Provider, MD  fluticasone (FLONASE) 50 MCG/ACT nasal spray Place 2 sprays into both nostrils daily.   Yes Historical Provider, MD  gabapentin (NEURONTIN) 600 MG tablet Take 600 mg by mouth 2 (two) times daily.   Yes Historical Provider, MD  insulin aspart protamine- aspart (NOVOLOG MIX  70/30) (70-30) 100 UNIT/ML injection Inject 20-30 Units into the skin 2 (two) times daily. 30 units in the morning and 20 units in the evening   Yes Historical Provider, MD  insulin glargine (LANTUS) 100 UNIT/ML injection Inject 25 Units into the skin at bedtime.   Yes Historical Provider, MD  levofloxacin (LEVAQUIN) 500 MG tablet Take 500 mg by mouth daily. Started 10/20/14, for 7 days ending 10/26/14 10/20/14  Yes Historical Provider, MD  montelukast (SINGULAIR) 10 MG tablet Take 10 mg by mouth at bedtime.   Yes Historical Provider, MD  olmesartan (BENICAR) 40 MG tablet Take 40 mg by mouth daily.   Yes Historical Provider, MD  olmesartan (BENICAR) 40 MG tablet Take 40 mg by mouth daily.   Yes Historical Provider, MD  predniSONE (DELTASONE) 5 MG tablet Take 5 mg by mouth daily. 10/18/14  Yes Historical Provider, MD  traMADol (ULTRAM) 50 MG tablet Take 100 mg by mouth at bedtime.    Yes Historical Provider, MD   BP 108/79 mmHg  Pulse 70  Temp(Src) 99 F (37.2 C) (Oral)  Resp 16  Ht 6\' 2"  (1.88 m)  Wt 230 lb (104.327 kg)  BMI 29.52 kg/m2  SpO2 92% Physical Exam  Constitutional: He is oriented to person, place, and time. He appears well-developed and well-nourished. No distress.  Awake, alert, nontoxic appearance  HENT:  Head: Normocephalic and atraumatic.  Right Ear: Tympanic membrane, external ear and ear canal normal.  Left Ear: Tympanic membrane, external ear and ear canal normal.  Nose: Nose normal. No epistaxis. Right sinus exhibits no maxillary sinus tenderness and no frontal sinus tenderness. Left sinus exhibits no maxillary sinus tenderness and no frontal sinus tenderness.  Mouth/Throat: Uvula is midline, oropharynx is clear and moist and mucous membranes are normal. Mucous membranes are not pale and not cyanotic. No oropharyngeal exudate, posterior oropharyngeal edema, posterior oropharyngeal erythema or tonsillar abscesses.  No oral lesions noted  Eyes: Conjunctivae are normal. Pupils  are equal, round, and reactive to light. No scleral icterus.  Neck: Normal range of motion and full passive range of motion without pain. Neck supple.  Cardiovascular: Normal rate, regular rhythm, normal heart sounds and intact distal pulses.   No murmur heard. Pulmonary/Chest: Effort normal and breath sounds normal. No stridor. No respiratory distress. He has no wheezes.  Equal chest expansion  Abdominal: Soft. Bowel sounds are normal. He exhibits no mass. There is no tenderness. There is no rebound and no guarding.  Musculoskeletal: Normal range of motion. He exhibits tenderness. He exhibits no edema.  TTP over the muscles and joints of the body without swelling, erythema, increased warmth or effusions of the joints noted.    Lymphadenopathy:    He has no cervical adenopathy.  Neurological: He is alert and oriented to person, place, and time.  Speech is clear and goal oriented Moves extremities without ataxia  Skin: Skin is warm and dry. Rash noted. He is not diaphoretic.  Annular, red/brown lesions to the palms and soles with TTP of the lesions and mils scaling;  Several similar lesions noted  to the face, but none on the trunk, back, abd, arms or legs  Psychiatric: He has a normal mood and affect.  Nursing note and vitals reviewed.   ED Course  Procedures (including critical care time) Labs Review Labs Reviewed  CBC WITH DIFFERENTIAL/PLATELET - Abnormal; Notable for the following:    Hemoglobin 12.9 (*)    MCH 25.9 (*)    Neutrophils Relative % 80 (*)    All other components within normal limits  COMPREHENSIVE METABOLIC PANEL - Abnormal; Notable for the following:    Glucose, Bld 162 (*)    Albumin 2.9 (*)    GFR calc non Af Amer 58 (*)    GFR calc Af Amer 67 (*)    Anion gap 4 (*)    All other components within normal limits  SEDIMENTATION RATE - Abnormal; Notable for the following:    Sed Rate 60 (*)    All other components within normal limits  CULTURE, BLOOD (ROUTINE X  2)  CULTURE, BLOOD (ROUTINE X 2)  RAPID HIV SCREEN ([facility])  PROTIME-INR  RPR  HIV ANTIBODY (ROUTINE TESTING)  C-REACTIVE PROTEIN  ROCKY MTN SPOTTED FVR ABS PNL(IGG+IGM)  URINALYSIS, ROUTINE W REFLEX MICROSCOPIC  LACTATE DEHYDROGENASE  CK    Imaging Review Dg Chest 2 View  10/26/2014   CLINICAL DATA:  Fever.  Generalized body aches.  Diabetes.  Cough.  EXAM: CHEST  2 VIEW  COMPARISON:  None.  FINDINGS: Lateral view degraded by patient arm position and mild motion. Midline trachea. Borderline cardiomegaly. Mediastinal contours otherwise within normal limits. No pleural effusion or pneumothorax. Clear lungs.  IMPRESSION: Borderline cardiomegaly, without acute disease.  Degraded lateral view.   Electronically Signed   By: Jeronimo Greaves M.D.   On: 10/26/2014 17:20     EKG Interpretation None      MDM   Final diagnoses:  Cough  Weight loss  Rash  Hemoptysis  Fever, unspecified fever cause   Derek Anthony presents with multiple complaints including low-grade fevers, weight loss, night sweats, rash to the palms and soles, myalgias, arthralgias, chills, cough, hemoptysis.  Patient has seen an allergist but has no diagnosis.  Patient and wife report he continues to get worse. Concerns for active TB versus latent syphilis versus vasculitis versus autoimmune in etiology versus unknown viral illness.  Patient's last travel was October 2015.  He denies history of TB or STD.  Get labs and reassess.  8:42 PM Patient has required 3 doses of pain medicine without complete control of his pain. Rapid HIV negative, PT/INR negative.  No leukocytosis or electrolyte abnormality. No acute kidney injury.  Patient with elevated sedimentation rate to 60. RPR, CRP and RMSF titers pending. Patient denies known tick or insect bites.  Vitals are stable. Will consult with ID and proceed with admission.  10:23 PM Pt discussed with Dr. Luciana Axe who recommends CT chest/abd/pelvis, LDH and CK total.      10:34  PM Discussed with Dr. Selena Batten who will admit to tele.    BP 108/79 mmHg  Pulse 70  Temp(Src) 99 F (37.2 C) (Oral)  Resp 16  Ht  (1.88 m)  Wt 230 lb (104.327 kg)  BMI 29.52 kg/m2  SpO2 92%   Dierdre Forth, PA-C 10/26/14 2235  Gilda Crease, MD 10/26/14 2243

## 2014-10-27 ENCOUNTER — Encounter (HOSPITAL_COMMUNITY): Payer: Self-pay | Admitting: *Deleted

## 2014-10-27 DIAGNOSIS — R509 Fever, unspecified: Secondary | ICD-10-CM

## 2014-10-27 LAB — GLUCOSE, CAPILLARY
GLUCOSE-CAPILLARY: 140 mg/dL — AB (ref 70–99)
GLUCOSE-CAPILLARY: 193 mg/dL — AB (ref 70–99)
Glucose-Capillary: 137 mg/dL — ABNORMAL HIGH (ref 70–99)
Glucose-Capillary: 146 mg/dL — ABNORMAL HIGH (ref 70–99)
Glucose-Capillary: 143 mg/dL — ABNORMAL HIGH (ref 70–99)

## 2014-10-27 LAB — URINALYSIS, ROUTINE W REFLEX MICROSCOPIC
Bilirubin Urine: NEGATIVE
Glucose, UA: NEGATIVE mg/dL
Hgb urine dipstick: NEGATIVE
Ketones, ur: NEGATIVE mg/dL
Leukocytes, UA: NEGATIVE
NITRITE: NEGATIVE
Protein, ur: NEGATIVE mg/dL
Specific Gravity, Urine: 1.039 — ABNORMAL HIGH (ref 1.005–1.030)
UROBILINOGEN UA: 1 mg/dL (ref 0.0–1.0)
pH: 6 (ref 5.0–8.0)

## 2014-10-27 LAB — HEPATITIS PANEL, ACUTE
HCV Ab: NEGATIVE
HEP B S AG: NEGATIVE
Hep A IgM: NONREACTIVE
Hep B C IgM: NONREACTIVE

## 2014-10-27 LAB — HIV ANTIBODY (ROUTINE TESTING W REFLEX): HIV SCREEN 4TH GENERATION: NONREACTIVE

## 2014-10-27 LAB — RPR: RPR Ser Ql: NONREACTIVE

## 2014-10-27 LAB — RHEUMATOID FACTOR: Rhuematoid fact SerPl-aCnc: 13.3 IU/mL (ref 0.0–13.9)

## 2014-10-27 LAB — TSH: TSH: 1.527 u[IU]/mL (ref 0.350–4.500)

## 2014-10-27 LAB — C-REACTIVE PROTEIN: CRP: 4.1 mg/dL — ABNORMAL HIGH (ref <0.60)

## 2014-10-27 MED ORDER — MONTELUKAST SODIUM 10 MG PO TABS
10.0000 mg | ORAL_TABLET | Freq: Every day | ORAL | Status: DC
  Administered 2014-10-27 – 2014-10-31 (×6): 10 mg via ORAL
  Filled 2014-10-27 (×7): qty 1

## 2014-10-27 MED ORDER — CHLORHEXIDINE GLUCONATE 0.12 % MT SOLN
15.0000 mL | Freq: Two times a day (BID) | OROMUCOSAL | Status: DC
  Administered 2014-10-27: 15 mL via OROMUCOSAL
  Filled 2014-10-27 (×3): qty 15

## 2014-10-27 MED ORDER — LEVOFLOXACIN IN D5W 750 MG/150ML IV SOLN
750.0000 mg | INTRAVENOUS | Status: DC
  Administered 2014-10-27: 750 mg via INTRAVENOUS
  Filled 2014-10-27 (×2): qty 150

## 2014-10-27 MED ORDER — SODIUM CHLORIDE 0.9 % IV SOLN
INTRAVENOUS | Status: AC
  Administered 2014-10-27 (×2): via INTRAVENOUS

## 2014-10-27 MED ORDER — IRBESARTAN 300 MG PO TABS
300.0000 mg | ORAL_TABLET | Freq: Every day | ORAL | Status: DC
  Administered 2014-10-27 – 2014-11-01 (×6): 300 mg via ORAL
  Filled 2014-10-27 (×6): qty 1

## 2014-10-27 MED ORDER — CETYLPYRIDINIUM CHLORIDE 0.05 % MT LIQD
7.0000 mL | Freq: Two times a day (BID) | OROMUCOSAL | Status: DC
Start: 2014-10-27 — End: 2014-10-27

## 2014-10-27 MED ORDER — TRAMADOL HCL 50 MG PO TABS
100.0000 mg | ORAL_TABLET | Freq: Every day | ORAL | Status: DC
  Administered 2014-10-27 – 2014-10-31 (×6): 100 mg via ORAL
  Filled 2014-10-27 (×6): qty 2

## 2014-10-27 MED ORDER — PREDNISONE 5 MG PO TABS
5.0000 mg | ORAL_TABLET | Freq: Every day | ORAL | Status: DC
  Administered 2014-10-27: 5 mg via ORAL
  Filled 2014-10-27 (×2): qty 1

## 2014-10-27 MED ORDER — VANCOMYCIN HCL IN DEXTROSE 1-5 GM/200ML-% IV SOLN
1000.0000 mg | Freq: Three times a day (TID) | INTRAVENOUS | Status: DC
  Administered 2014-10-27 – 2014-10-28 (×4): 1000 mg via INTRAVENOUS
  Filled 2014-10-27 (×7): qty 200

## 2014-10-27 MED ORDER — ACETAMINOPHEN 650 MG RE SUPP: 650.0000 mg | Freq: Four times a day (QID) | RECTAL | Status: DC | PRN

## 2014-10-27 MED ORDER — LEVOFLOXACIN IN D5W 500 MG/100ML IV SOLN
500.0000 mg | INTRAVENOUS | Status: DC
  Administered 2014-10-27: 500 mg via INTRAVENOUS
  Filled 2014-10-27: qty 100

## 2014-10-27 MED ORDER — ACETAMINOPHEN 325 MG PO TABS
650.0000 mg | ORAL_TABLET | Freq: Four times a day (QID) | ORAL | Status: DC | PRN
  Administered 2014-10-27 – 2014-10-28 (×3): 650 mg via ORAL
  Filled 2014-10-27 (×3): qty 2

## 2014-10-27 MED ORDER — MENTHOL 3 MG MT LOZG
1.0000 | LOZENGE | OROMUCOSAL | Status: DC | PRN
  Filled 2014-10-27 (×2): qty 9

## 2014-10-27 MED ORDER — ALLOPURINOL 300 MG PO TABS
300.0000 mg | ORAL_TABLET | Freq: Every day | ORAL | Status: DC
  Administered 2014-10-27 – 2014-11-01 (×6): 300 mg via ORAL
  Filled 2014-10-27 (×6): qty 1

## 2014-10-27 MED ORDER — ASPIRIN EC 81 MG PO TBEC
81.0000 mg | DELAYED_RELEASE_TABLET | Freq: Every day | ORAL | Status: DC
  Administered 2014-10-27 – 2014-11-01 (×6): 81 mg via ORAL
  Filled 2014-10-27 (×6): qty 1

## 2014-10-27 MED ORDER — INSULIN GLARGINE 100 UNIT/ML ~~LOC~~ SOLN
25.0000 [IU] | Freq: Every day | SUBCUTANEOUS | Status: DC
  Administered 2014-10-27 – 2014-10-29 (×4): 25 [IU] via SUBCUTANEOUS
  Filled 2014-10-27 (×5): qty 0.25

## 2014-10-27 MED ORDER — VANCOMYCIN HCL 10 G IV SOLR
1500.0000 mg | Freq: Once | INTRAVENOUS | Status: AC
  Administered 2014-10-27: 1500 mg via INTRAVENOUS
  Filled 2014-10-27: qty 1500

## 2014-10-27 MED ORDER — SODIUM CHLORIDE 0.9 % IJ SOLN
3.0000 mL | Freq: Two times a day (BID) | INTRAMUSCULAR | Status: DC
  Administered 2014-10-27 – 2014-10-31 (×7): 3 mL via INTRAVENOUS

## 2014-10-27 MED ORDER — GABAPENTIN 600 MG PO TABS
600.0000 mg | ORAL_TABLET | Freq: Two times a day (BID) | ORAL | Status: DC
  Administered 2014-10-27 – 2014-11-01 (×11): 600 mg via ORAL
  Filled 2014-10-27 (×12): qty 1

## 2014-10-27 MED ORDER — LORATADINE 10 MG PO TABS
10.0000 mg | ORAL_TABLET | Freq: Every day | ORAL | Status: DC
  Administered 2014-10-27 – 2014-11-01 (×6): 10 mg via ORAL
  Filled 2014-10-27 (×6): qty 1

## 2014-10-27 MED ORDER — INSULIN ASPART 100 UNIT/ML ~~LOC~~ SOLN: 0.0000 [IU] | Freq: Every day | SUBCUTANEOUS | Status: DC

## 2014-10-27 MED ORDER — INSULIN ASPART 100 UNIT/ML ~~LOC~~ SOLN
0.0000 [IU] | Freq: Three times a day (TID) | SUBCUTANEOUS | Status: DC
Start: 2014-10-27 — End: 2014-10-30
  Administered 2014-10-27 (×2): 1 [IU] via SUBCUTANEOUS
  Administered 2014-10-27: 2 [IU] via SUBCUTANEOUS
  Administered 2014-10-28 (×2): 1 [IU] via SUBCUTANEOUS
  Administered 2014-10-28: 2 [IU] via SUBCUTANEOUS
  Administered 2014-10-29: 3 [IU] via SUBCUTANEOUS
  Administered 2014-10-29: 1 [IU] via SUBCUTANEOUS
  Administered 2014-10-30: 7 [IU] via SUBCUTANEOUS
  Administered 2014-10-30: 3 [IU] via SUBCUTANEOUS

## 2014-10-27 MED ORDER — HYDROMORPHONE HCL 1 MG/ML IJ SOLN
1.0000 mg | INTRAMUSCULAR | Status: AC | PRN
  Administered 2014-10-27 (×3): 1 mg via INTRAVENOUS
  Filled 2014-10-27 (×3): qty 1

## 2014-10-27 MED ORDER — HYDROMORPHONE HCL 1 MG/ML IJ SOLN
1.0000 mg | INTRAMUSCULAR | Status: DC | PRN
  Administered 2014-10-27 – 2014-11-01 (×23): 1 mg via INTRAVENOUS
  Filled 2014-10-27 (×23): qty 1

## 2014-10-27 NOTE — Progress Notes (Signed)
ANTIBIOTIC CONSULT NOTE - INITIAL  Pharmacy Consult for Vancomycin  Indication: r/o endocarditis  Allergies  Allergen Reactions  . Sulfa Antibiotics Itching and Rash    Burning also  . Penicillins Itching and Rash    Patient Measurements: Height: 6\' 2"  (188 cm) Weight: 226 lb 10.1 oz (102.8 kg) IBW/kg (Calculated) : 82.2  Vital Signs: Temp: 99.1 F (37.3 C) (03/06 0045) Temp Source: Oral (03/06 0045) BP: 124/66 mmHg (03/06 0045) Pulse Rate: 69 (03/06 0045) Intake/Output from previous day: 03/05 0701 - 03/06 0700 In: -  Out: 650 [Urine:650] Intake/Output from this shift: Total I/O In: -  Out: 650 [Urine:650]  Labs:  Recent Labs  10/26/14 1408  WBC 7.3  HGB 12.9*  PLT 281  CREATININE 1.30   Estimated Creatinine Clearance: 77.3 mL/min (by C-G formula based on Cr of 1.3). No results for input(s): VANCOTROUGH, VANCOPEAK, VANCORANDOM, GENTTROUGH, GENTPEAK, GENTRANDOM, TOBRATROUGH, TOBRAPEAK, TOBRARND, AMIKACINPEAK, AMIKACINTROU, AMIKACIN in the last 72 hours.   Microbiology: No results found for this or any previous visit (from the past 720 hour(s)).  Medical History: Past Medical History  Diagnosis Date  . Diabetes mellitus without complication   . Hypertension   . GERD (gastroesophageal reflux disease)   . Anemia   . Sickle cell trait   . Gout     Medications:  Prescriptions prior to admission  Medication Sig Dispense Refill Last Dose  . allopurinol (ZYLOPRIM) 300 MG tablet Take 300 mg by mouth daily.   10/25/2014 at Unknown time  . aspirin EC 81 MG tablet Take 81 mg by mouth daily.   10/25/2014 at Unknown time  . cetirizine (ZYRTEC) 10 MG tablet Take 10 mg by mouth every morning.    10/25/2014 at Unknown time  . fluticasone (FLONASE) 50 MCG/ACT nasal spray Place 2 sprays into both nostrils daily.   10/25/2014 at Unknown time  . gabapentin (NEURONTIN) 600 MG tablet Take 600 mg by mouth 2 (two) times daily.   10/25/2014 at Unknown time  . insulin aspart protamine-  aspart (NOVOLOG MIX 70/30) (70-30) 100 UNIT/ML injection Inject 20-30 Units into the skin 2 (two) times daily. 30 units in the morning and 20 units in the evening   10/25/2014 at Unknown time  . insulin glargine (LANTUS) 100 UNIT/ML injection Inject 25 Units into the skin at bedtime.   10/25/2014 at Unknown time  . levofloxacin (LEVAQUIN) 500 MG tablet Take 500 mg by mouth daily. Started 10/20/14, for 7 days ending 10/26/14  0 10/25/2014 at Unknown time  . montelukast (SINGULAIR) 10 MG tablet Take 10 mg by mouth at bedtime.   10/25/2014 at Unknown time  . olmesartan (BENICAR) 40 MG tablet Take 40 mg by mouth daily.   10/25/2014 at Unknown time  . olmesartan (BENICAR) 40 MG tablet Take 40 mg by mouth daily.   10/25/2014 at Unknown time  . predniSONE (DELTASONE) 5 MG tablet Take 5 mg by mouth daily.  0 10/25/2014 at Unknown time  . traMADol (ULTRAM) 50 MG tablet Take 100 mg by mouth at bedtime.    10/25/2014 at Unknown time   Assessment: 59 yo male with B UE blisters/rash, possible endocarditis, for empiric antibiotics  Goal of Therapy:  Vancomycin trough level 15-20 mcg/ml  Plan:  Vancomycin 1500 mg IV now, then 1 g IV q8h  Silva Aamodt, Gary FleetGregory Vernon 10/27/2014,12:56 AM

## 2014-10-27 NOTE — Progress Notes (Signed)
[facility] PROGRESS NOTE  Derek Anthony ZGY:[id number] DOB: 1955/10/17 DOA: 10/26/2014 PCP: No primary care provider on file.  Assessment/Plan: 59 y/o male with PMH of IDDM, HTN, GERD, Gout presented with progressively worsening had trash, blisters associated with fever, chills, generalized pains, weakness, tiredness, loss of appetite for 4 weeks. He reports the rash began 4 weeks ago. He has been Dr. Eliot Ford (an allergist) has been testing for the rash, and received a trial of oral prednisone; Patient noted to be febrile in ED. ER physician d/w Dr. Linus Salmons who recommended to admit to r/o infectious process   1. Fever, hand rash, progressive constitutional symptoms of unclear etiology; no significant leukocytosis,  -patient is started on IV atx, pend blood cultures, RPR, RMSF titers, consulted ID evaluation; check ESR, CRP, tsh,  ANA, RA; anca; pend echo   2. IDDM, cont insulin regimen, check HA1c 3. HTN, stable; cont home regimen     Code Status: full Family Communication: d/w patient, his wife (indicate person spoken with, relationship, and if by phone, the number) Disposition Plan: home pend clinical improvement    Consultants:  ID  Procedures:  Pend echo  Antibiotics:  levfoloxacin IV 3/5<<<  vanc 3/5>>>>   (indicate start date, and stop date if known)  HPI/Subjective: Alert, oriented   Objective: Filed Vitals:   10/27/14 0045  BP: 124/66  Pulse: 69  Temp: 99.1 F (37.3 C)  Resp: 16    Intake/Output Summary (Last 24 hours) at 10/27/14 1032 Last data filed at 10/27/14 0221  Gross per 24 hour  Intake    100 ml  Output   1100 ml  Net  -1000 ml   Filed Weights   10/26/14 1401 10/27/14 0045  Weight: 104.327 kg (230 lb) 102.8 kg (226 lb 10.1 oz)    Exam:   General:  alert  Cardiovascular: s1,s2 rrr  Respiratory: CTA BL  Abdomen: soft, nt,nd   Musculoskeletal: no no leg edema   Data Reviewed: Basic Metabolic Panel:  Recent Labs Lab  10/26/14 1408  NA 142  K 4.1  CL 106  CO2 32  GLUCOSE 162*  BUN 10  CREATININE 1.30  CALCIUM 8.8   Liver Function Tests:  Recent Labs Lab 10/26/14 1408  AST 35  ALT 24  ALKPHOS 56  BILITOT 1.0  PROT 6.8  ALBUMIN 2.9*   No results for input(s): LIPASE, AMYLASE in the last 168 hours. No results for input(s): AMMONIA in the last 168 hours. CBC:  Recent Labs Lab 10/26/14 1408  WBC 7.3  NEUTROABS 5.8  HGB 12.9*  HCT 39.5  MCV 79.2  PLT 281   Cardiac Enzymes:  Recent Labs Lab 10/26/14 2250  CKTOTAL 227   BNP (last 3 results) No results for input(s): BNP in the last 8760 hours.  ProBNP (last 3 results) No results for input(s): PROBNP in the last 8760 hours.  CBG:  Recent Labs Lab 10/27/14 0217 10/27/14 0755  GLUCAP 137* 140*    No results found for this or any previous visit (from the past 240 hour(s)).   Studies: Dg Chest 2 View  10/26/2014   CLINICAL DATA:  Fever.  Generalized body aches.  Diabetes.  Cough.  EXAM: CHEST  2 VIEW  COMPARISON:  None.  FINDINGS: Lateral view degraded by patient arm position and mild motion. Midline trachea. Borderline cardiomegaly. Mediastinal contours otherwise within normal limits. No pleural effusion or pneumothorax. Clear lungs.  IMPRESSION: Borderline cardiomegaly, without acute disease.  Degraded lateral view.  Electronically Signed   By: Abigail Miyamoto M.D.   On: 10/26/2014 17:20   Ct Chest W Contrast  10/27/2014   CLINICAL DATA:  Acute onset of generalized body aches and fever. Initial encounter.  EXAM: CT CHEST, ABDOMEN, AND PELVIS WITH CONTRAST  TECHNIQUE: Multidetector CT imaging of the chest, abdomen and pelvis was performed following the standard protocol during bolus administration of intravenous contrast.  CONTRAST:  124m OMNIPAQUE IOHEXOL 300 MG/ML  SOLN  COMPARISON:  Renal ultrasound performed 06/26/2013, and MRI of the lumbar spine performed 05/19/2013  FINDINGS: CT CHEST FINDINGS  Mild bibasilar atelectasis or  scarring is noted. Scattered blebs are noted within the right upper lobe, and to a lesser extent within the left upper lobe. There is nonspecific minimal peripheral haziness within both lungs, with a ring of peripheral sparing. No pleural effusion or pneumothorax is seen. No mass is identified.  Scattered coronary artery calcification is seen. The mediastinum is otherwise unremarkable. Visualized mediastinal nodes remain normal in size. No pericardial effusion is identified. The great vessels are grossly unremarkable in appearance. The visualized portions of thyroid gland are unremarkable. No axillary lymphadenopathy is seen.  No acute osseous abnormalities are identified.  CT ABDOMEN AND PELVIS FINDINGS  A calcified granuloma is noted within the right hepatic lobe. The liver and spleen are otherwise unremarkable in appearance for. The gallbladder is within normal limits. The pancreas and adrenal glands are unremarkable.  Bilateral renal scarring is noted. Mild nonspecific perinephric stranding is noted bilaterally. The kidneys are otherwise unremarkable. There is no evidence of hydronephrosis. No renal or ureteral stones are seen.  No free fluid is identified. The small bowel is unremarkable in appearance. The stomach is within normal limits. No acute vascular abnormalities are seen. Scattered calcification is noted along the common iliac arteries bilaterally.  The appendix is normal in caliber, without evidence for appendicitis. Contrast progresses to the level of the distal descending colon. The colon is unremarkable in appearance.  The bladder is mildly distended and grossly unremarkable. The prostate remains normal in size. No inguinal lymphadenopathy is seen.  No acute osseous abnormalities are identified.  IMPRESSION: 1. No acute abnormality seen to explain the patient's symptoms. 2. Nonspecific minimal peripheral haziness noted within both lungs, with a ring of peripheral sparing. This is of uncertain  significance. Mild bibasilar atelectasis or scarring noted. Scattered blebs within the upper lobes, more prominent on the right. 3. Scattered coronary artery calcifications seen. 4. Scattered calcification along the common iliac arteries bilaterally.   Electronically Signed   By: JGarald BaldingM.D.   On: 10/27/2014 00:30   Ct Abdomen Pelvis W Contrast  10/27/2014   CLINICAL DATA:  Acute onset of generalized body aches and fever. Initial encounter.  EXAM: CT CHEST, ABDOMEN, AND PELVIS WITH CONTRAST  TECHNIQUE: Multidetector CT imaging of the chest, abdomen and pelvis was performed following the standard protocol during bolus administration of intravenous contrast.  CONTRAST:  1060mOMNIPAQUE IOHEXOL 300 MG/ML  SOLN  COMPARISON:  Renal ultrasound performed 06/26/2013, and MRI of the lumbar spine performed 05/19/2013  FINDINGS: CT CHEST FINDINGS  Mild bibasilar atelectasis or scarring is noted. Scattered blebs are noted within the right upper lobe, and to a lesser extent within the left upper lobe. There is nonspecific minimal peripheral haziness within both lungs, with a ring of peripheral sparing. No pleural effusion or pneumothorax is seen. No mass is identified.  Scattered coronary artery calcification is seen. The mediastinum is otherwise unremarkable. Visualized mediastinal nodes  remain normal in size. No pericardial effusion is identified. The great vessels are grossly unremarkable in appearance. The visualized portions of thyroid gland are unremarkable. No axillary lymphadenopathy is seen.  No acute osseous abnormalities are identified.  CT ABDOMEN AND PELVIS FINDINGS  A calcified granuloma is noted within the right hepatic lobe. The liver and spleen are otherwise unremarkable in appearance for. The gallbladder is within normal limits. The pancreas and adrenal glands are unremarkable.  Bilateral renal scarring is noted. Mild nonspecific perinephric stranding is noted bilaterally. The kidneys are otherwise  unremarkable. There is no evidence of hydronephrosis. No renal or ureteral stones are seen.  No free fluid is identified. The small bowel is unremarkable in appearance. The stomach is within normal limits. No acute vascular abnormalities are seen. Scattered calcification is noted along the common iliac arteries bilaterally.  The appendix is normal in caliber, without evidence for appendicitis. Contrast progresses to the level of the distal descending colon. The colon is unremarkable in appearance.  The bladder is mildly distended and grossly unremarkable. The prostate remains normal in size. No inguinal lymphadenopathy is seen.  No acute osseous abnormalities are identified.  IMPRESSION: 1. No acute abnormality seen to explain the patient's symptoms. 2. Nonspecific minimal peripheral haziness noted within both lungs, with a ring of peripheral sparing. This is of uncertain significance. Mild bibasilar atelectasis or scarring noted. Scattered blebs within the upper lobes, more prominent on the right. 3. Scattered coronary artery calcifications seen. 4. Scattered calcification along the common iliac arteries bilaterally.   Electronically Signed   By: Garald Balding M.D.   On: 10/27/2014 00:30    Scheduled Meds: . allopurinol  300 mg Oral Daily  . antiseptic oral rinse  7 mL Mouth Rinse q12n4p  . aspirin EC  81 mg Oral Daily  . chlorhexidine  15 mL Mouth Rinse BID  . gabapentin  600 mg Oral BID  . insulin aspart  0-5 Units Subcutaneous QHS  . insulin aspart  0-9 Units Subcutaneous TID WC  . insulin glargine  25 Units Subcutaneous QHS  . irbesartan  300 mg Oral Daily  . levofloxacin (LEVAQUIN) IV  500 mg Intravenous Q24H  . loratadine  10 mg Oral Daily  . montelukast  10 mg Oral QHS  . predniSONE  5 mg Oral Daily  . sodium chloride  3 mL Intravenous Q12H  . traMADol  100 mg Oral QHS  . vancomycin  1,000 mg Intravenous Q8H   Continuous Infusions: . sodium chloride 75 mL/hr at 10/27/14 0150     Active Problems:   Rash   Anemia   Fever    Time spent: >35 minutes     Kinnie Feil  [facility] Pager [phone]. If 7PM-7AM, please contact night-coverage at [email or web address], password [facility] 10/27/2014, 10:32 AM  LOS: 1 day

## 2014-10-27 NOTE — Progress Notes (Signed)
Dr Selena BattenKim paged to verify that pt was NOT to be on Airborne precautions. Pt is on droplet. Pt is a/o. VSS. Very Weak. Complaints of bone pain t/o. Jaw is sore and throat "hurts, it's difficult to swallow...feels very dry". Dilaudid given. Blood sugar is WNL. Snack and drink requested and given. No complaints of nausea. SCD's placed. Fluids and antibiotics infusing. Urine sent per order. Supportive, caring wife at the bedside. Monitoring closely.

## 2014-10-28 DIAGNOSIS — R05 Cough: Secondary | ICD-10-CM | POA: Insufficient documentation

## 2014-10-28 DIAGNOSIS — R059 Cough, unspecified: Secondary | ICD-10-CM | POA: Insufficient documentation

## 2014-10-28 DIAGNOSIS — R509 Fever, unspecified: Secondary | ICD-10-CM

## 2014-10-28 DIAGNOSIS — M79641 Pain in right hand: Secondary | ICD-10-CM

## 2014-10-28 DIAGNOSIS — R634 Abnormal weight loss: Secondary | ICD-10-CM

## 2014-10-28 DIAGNOSIS — M25531 Pain in right wrist: Secondary | ICD-10-CM

## 2014-10-28 DIAGNOSIS — M79642 Pain in left hand: Secondary | ICD-10-CM

## 2014-10-28 DIAGNOSIS — M25532 Pain in left wrist: Secondary | ICD-10-CM

## 2014-10-28 LAB — HEMOGLOBIN A1C
Hgb A1c MFr Bld: 8.5 % — ABNORMAL HIGH (ref 4.8–5.6)
MEAN PLASMA GLUCOSE: 197 mg/dL

## 2014-10-28 LAB — BASIC METABOLIC PANEL
ANION GAP: 3 — AB (ref 5–15)
BUN: 9 mg/dL (ref 6–23)
CALCIUM: 7.7 mg/dL — AB (ref 8.4–10.5)
CO2: 27 mmol/L (ref 19–32)
Chloride: 108 mmol/L (ref 96–112)
Creatinine, Ser: 1.01 mg/dL (ref 0.50–1.35)
GFR calc Af Amer: >90 mL/min (ref >90)
GFR, EST NON AFRICAN AMERICAN: 79 mL/min — AB (ref >90)
Glucose, Bld: 153 mg/dL — ABNORMAL HIGH (ref 70–99)
Potassium: 3.9 mmol/L (ref 3.5–5.1)
SODIUM: 138 mmol/L (ref 135–145)

## 2014-10-28 LAB — CBC
HEMATOCRIT: 33 % — AB (ref 39.0–52.0)
HEMOGLOBIN: 10.9 g/dL — AB (ref 13.0–17.0)
MCH: 26.3 pg (ref 26.0–34.0)
MCHC: 33 g/dL (ref 30.0–36.0)
MCV: 79.5 fL (ref 78.0–100.0)
Platelets: 189 10*3/uL (ref 150–400)
RBC: 4.15 MIL/uL — ABNORMAL LOW (ref 4.22–5.81)
RDW: 13.8 % (ref 11.5–15.5)
WBC: 5.9 10*3/uL (ref 4.0–10.5)

## 2014-10-28 LAB — ROCKY MTN SPOTTED FVR ABS PNL(IGG+IGM)
RMSF IGG: NEGATIVE
RMSF IgM: 0.22 index (ref 0.00–0.89)

## 2014-10-28 LAB — B. BURGDORFI ANTIBODIES

## 2014-10-28 LAB — GLUCOSE, CAPILLARY
GLUCOSE-CAPILLARY: 137 mg/dL — AB (ref 70–99)
GLUCOSE-CAPILLARY: 164 mg/dL — AB (ref 70–99)
Glucose-Capillary: 148 mg/dL — ABNORMAL HIGH (ref 70–99)
Glucose-Capillary: 162 mg/dL — ABNORMAL HIGH (ref 70–99)

## 2014-10-28 LAB — CYCLIC CITRUL PEPTIDE ANTIBODY, IGG/IGA: CCP Antibodies IgG/IgA: 5 units (ref 0–19)

## 2014-10-28 LAB — ANTINUCLEAR ANTIBODIES, IFA: ANTINUCLEAR ANTIBODIES, IFA: NEGATIVE

## 2014-10-28 MED ORDER — GLUCERNA SHAKE PO LIQD
237.0000 mL | Freq: Three times a day (TID) | ORAL | Status: DC
  Administered 2014-10-29 – 2014-10-31 (×9): 237 mL via ORAL
  Filled 2014-10-28: qty 237

## 2014-10-28 MED ORDER — LIDOCAINE-EPINEPHRINE 2 %-1:100000 IJ SOLN
20.0000 mL | Freq: Once | INTRAMUSCULAR | Status: AC
  Administered 2014-10-29: 1 mL via INTRADERMAL
  Filled 2014-10-28: qty 20

## 2014-10-28 NOTE — Progress Notes (Signed)
*  PRELIMINARY RESULTS* Echocardiogram 2D Echocardiogram has been performed.  Jeryl ColumbiaLLIOTT, Oskar Cretella 10/28/2014, 12:24 PM

## 2014-10-28 NOTE — Progress Notes (Addendum)
**Derek Anthony De-Identified via Obfuscation** Derek Derek Anthony  Derek Derek Anthony:[id number] DOB: 1955-09-06 DOA: 10/26/2014 PCP: No primary care provider on file.  Assessment/Plan: 58 y/o male with PMH of IDDM, HTN, GERD, Gout presented with progressively worsening of blisters associated with fever, chills, generalized pains, weakness, tiredness, loss of appetite for 4 weeks. He reports the rash began 4 weeks ago. He has been Dr. Eliot Ford (an allergist) has been testing for the rash, and received oral prednisone; Patient noted to be febrile in ED. ER physician d/w Dr. Linus Salmons who recommended to admit to r/o infectious process.  Subjective:  Patient also mentions a cough with yellow sputum and a severe sore throat for 3-4 wks.    Assessment/ Plan:  1. Fever, hand rash, constitutional symptoms,cough with yellow sputum and sore throat -  no significant leukocytosis,  -patient started on IV Levaquin and Vanc- blood cultures neg- stop Levaquin and Vanc today as no improvement in symptoms from it - is on Prednisone 5 mg daily started by allergist- will stop this a well as it does not seem to be helping - continues to have a fever and rash- last temp 100 on 3/7 at 12 AM - CRP elevated at 4.1, ESR 60 - RF- neg - B burg neg - HIV neg - RPR neg -  RMSF titers, ANA, ANCA CCP, ECHO pend  - have formally asked for ID consult - will ask sx for biopsy- they will do it tomorrow  2. IDDM - cont insulin regimen, HA1c 8.5 - sugars stable here  3. HTN, stable - cont ARB    Code Status: full Family Communication: d/w patient Disposition Plan: home pend clinical improvement  DVT prophylaxis: SCDs   Consultants:  ID  surgery  Procedures:  Pend echo  Antibiotics:  levfoloxacin IV 3/5>> 3/7  vanc 3/5>>>> 3/7   HPI/Subjective: Alert, oriented   Objective: Filed Vitals:   10/28/14 0426  BP: 136/64  Pulse: 68  Temp: 98.4 F (36.9 C)  Resp: 18    Intake/Output Summary (Last 24 hours) at 10/28/14 1504 Last  data filed at 10/28/14 0700  Gross per 24 hour  Intake   1110 ml  Output    900 ml  Net    210 ml   Filed Weights   10/26/14 1401 10/27/14 0045 10/28/14 0426  Weight: 104.327 kg (230 lb) 102.8 kg (226 lb 10.1 oz) 103 kg (227 lb 1.2 oz)    Exam:   General:  alert  Cardiovascular: s1,s2 rrr  Respiratory: CTA BL  Abdomen: soft, nt,nd   Musculoskeletal: no no leg edema   Skin: vesicular appearing rash in various stages on palms and elbows- blemished from old rash noted   Data Reviewed: Basic Metabolic Panel:  Recent Labs Lab 10/26/14 1408 10/28/14 0658  NA 142 138  K 4.1 3.9  CL 106 108  CO2 32 27  GLUCOSE 162* 153*  BUN 10 9  CREATININE 1.30 1.01  CALCIUM 8.8 7.7*   Liver Function Tests:  Recent Labs Lab 10/26/14 1408  AST 35  ALT 24  ALKPHOS 56  BILITOT 1.0  PROT 6.8  ALBUMIN 2.9*   No results for input(s): LIPASE, AMYLASE in the last 168 hours. No results for input(s): AMMONIA in the last 168 hours. CBC:  Recent Labs Lab 10/26/14 1408 10/28/14 0658  WBC 7.3 5.9  NEUTROABS 5.8  --   HGB 12.9* 10.9*  HCT 39.5 33.0*  MCV 79.2 79.5  PLT 281 189   Cardiac Enzymes:  Recent  Labs Lab 10/26/14 2250  CKTOTAL 227   BNP (last 3 results) No results for input(s): BNP in the last 8760 hours.  ProBNP (last 3 results) No results for input(s): PROBNP in the last 8760 hours.  CBG:  Recent Labs Lab 10/27/14 1146 10/27/14 1732 10/27/14 2107 10/28/14 0756 10/28/14 1156  GLUCAP 146* 193* 143* 137* 164*    Recent Results (from the past 240 hour(s))  Blood culture (routine x 2)     Status: None (Preliminary result)   Collection Time: 10/26/14 10:48 PM  Result Value Ref Range Status   Specimen Description BLOOD RIGHT HAND  Final   Special Requests BOTTLES DRAWN AEROBIC AND ANAEROBIC 4CC EACH  Final   Culture   Final           BLOOD CULTURE RECEIVED NO GROWTH TO DATE CULTURE WILL BE HELD FOR 5 DAYS BEFORE ISSUING A FINAL NEGATIVE  REPORT Performed at [facility]    Report Status PENDING  Incomplete  Blood culture (routine x 2)     Status: None (Preliminary result)   Collection Time: 10/26/14 10:53 PM  Result Value Ref Range Status   Specimen Description BLOOD LEFT HAND  Final   Special Requests BOTTLES DRAWN AEROBIC AND ANAEROBIC 5CC EACH  Final   Culture   Final           BLOOD CULTURE RECEIVED NO GROWTH TO DATE CULTURE WILL BE HELD FOR 5 DAYS BEFORE ISSUING A FINAL NEGATIVE REPORT Performed at [facility]    Report Status PENDING  Incomplete     Studies: Dg Chest 2 View  10/26/2014   CLINICAL DATA:  Fever.  Generalized body aches.  Diabetes.  Cough.  EXAM: CHEST  2 VIEW  COMPARISON:  None.  FINDINGS: Lateral view degraded by patient arm position and mild motion. Midline trachea. Borderline cardiomegaly. Mediastinal contours otherwise within normal limits. No pleural effusion or pneumothorax. Clear lungs.  IMPRESSION: Borderline cardiomegaly, without acute disease.  Degraded lateral view.   Electronically Signed   By: Abigail Miyamoto M.D.   On: 10/26/2014 17:20   Ct Chest W Contrast  10/27/2014   CLINICAL DATA:  Acute onset of generalized body aches and fever. Initial encounter.  EXAM: CT CHEST, ABDOMEN, AND PELVIS WITH CONTRAST  TECHNIQUE: Multidetector CT imaging of the chest, abdomen and pelvis was performed following the standard protocol during bolus administration of intravenous contrast.  CONTRAST:  141m OMNIPAQUE IOHEXOL 300 MG/ML  SOLN  COMPARISON:  Renal ultrasound performed 06/26/2013, and MRI of the lumbar spine performed 05/19/2013  FINDINGS: CT CHEST FINDINGS  Mild bibasilar atelectasis or scarring is noted. Scattered blebs are noted within the right upper lobe, and to a lesser extent within the left upper lobe. There is nonspecific minimal peripheral haziness within both lungs, with a ring of peripheral sparing. No pleural effusion or pneumothorax is seen. No mass is identified.  Scattered  coronary artery calcification is seen. The mediastinum is otherwise unremarkable. Visualized mediastinal nodes remain normal in size. No pericardial effusion is identified. The great vessels are grossly unremarkable in appearance. The visualized portions of thyroid gland are unremarkable. No axillary lymphadenopathy is seen.  No acute osseous abnormalities are identified.  CT ABDOMEN AND PELVIS FINDINGS  A calcified granuloma is noted within the right hepatic lobe. The liver and spleen are otherwise unremarkable in appearance for. The gallbladder is within normal limits. The pancreas and adrenal glands are unremarkable.  Bilateral renal scarring is noted. Mild nonspecific perinephric stranding  is noted bilaterally. The kidneys are otherwise unremarkable. There is no evidence of hydronephrosis. No renal or ureteral stones are seen.  No free fluid is identified. The small bowel is unremarkable in appearance. The stomach is within normal limits. No acute vascular abnormalities are seen. Scattered calcification is noted along the common iliac arteries bilaterally.  The appendix is normal in caliber, without evidence for appendicitis. Contrast progresses to the level of the distal descending colon. The colon is unremarkable in appearance.  The bladder is mildly distended and grossly unremarkable. The prostate remains normal in size. No inguinal lymphadenopathy is seen.  No acute osseous abnormalities are identified.  IMPRESSION: 1. No acute abnormality seen to explain the patient's symptoms. 2. Nonspecific minimal peripheral haziness noted within both lungs, with a ring of peripheral sparing. This is of uncertain significance. Mild bibasilar atelectasis or scarring noted. Scattered blebs within the upper lobes, more prominent on the right. 3. Scattered coronary artery calcifications seen. 4. Scattered calcification along the common iliac arteries bilaterally.   Electronically Signed   By: Garald Balding M.D.   On:  10/27/2014 00:30   Ct Abdomen Pelvis W Contrast  10/27/2014   CLINICAL DATA:  Acute onset of generalized body aches and fever. Initial encounter.  EXAM: CT CHEST, ABDOMEN, AND PELVIS WITH CONTRAST  TECHNIQUE: Multidetector CT imaging of the chest, abdomen and pelvis was performed following the standard protocol during bolus administration of intravenous contrast.  CONTRAST:  134m OMNIPAQUE IOHEXOL 300 MG/ML  SOLN  COMPARISON:  Renal ultrasound performed 06/26/2013, and MRI of the lumbar spine performed 05/19/2013  FINDINGS: CT CHEST FINDINGS  Mild bibasilar atelectasis or scarring is noted. Scattered blebs are noted within the right upper lobe, and to a lesser extent within the left upper lobe. There is nonspecific minimal peripheral haziness within both lungs, with a ring of peripheral sparing. No pleural effusion or pneumothorax is seen. No mass is identified.  Scattered coronary artery calcification is seen. The mediastinum is otherwise unremarkable. Visualized mediastinal nodes remain normal in size. No pericardial effusion is identified. The great vessels are grossly unremarkable in appearance. The visualized portions of thyroid gland are unremarkable. No axillary lymphadenopathy is seen.  No acute osseous abnormalities are identified.  CT ABDOMEN AND PELVIS FINDINGS  A calcified granuloma is noted within the right hepatic lobe. The liver and spleen are otherwise unremarkable in appearance for. The gallbladder is within normal limits. The pancreas and adrenal glands are unremarkable.  Bilateral renal scarring is noted. Mild nonspecific perinephric stranding is noted bilaterally. The kidneys are otherwise unremarkable. There is no evidence of hydronephrosis. No renal or ureteral stones are seen.  No free fluid is identified. The small bowel is unremarkable in appearance. The stomach is within normal limits. No acute vascular abnormalities are seen. Scattered calcification is noted along the common iliac  arteries bilaterally.  The appendix is normal in caliber, without evidence for appendicitis. Contrast progresses to the level of the distal descending colon. The colon is unremarkable in appearance.  The bladder is mildly distended and grossly unremarkable. The prostate remains normal in size. No inguinal lymphadenopathy is seen.  No acute osseous abnormalities are identified.  IMPRESSION: 1. No acute abnormality seen to explain the patient's symptoms. 2. Nonspecific minimal peripheral haziness noted within both lungs, with a ring of peripheral sparing. This is of uncertain significance. Mild bibasilar atelectasis or scarring noted. Scattered blebs within the upper lobes, more prominent on the right. 3. Scattered coronary artery calcifications seen. 4. Scattered  calcification along the common iliac arteries bilaterally.   Electronically Signed   By: Garald Balding M.D.   On: 10/27/2014 00:30    Scheduled Meds: . allopurinol  300 mg Oral Daily  . aspirin EC  81 mg Oral Daily  . gabapentin  600 mg Oral BID  . insulin aspart  0-5 Units Subcutaneous QHS  . insulin aspart  0-9 Units Subcutaneous TID WC  . insulin glargine  25 Units Subcutaneous QHS  . irbesartan  300 mg Oral Daily  . loratadine  10 mg Oral Daily  . montelukast  10 mg Oral QHS  . sodium chloride  3 mL Intravenous Q12H  . traMADol  100 mg Oral QHS  . vancomycin  1,000 mg Intravenous Q8H   Continuous Infusions:   Time spent: >35 minutes     [place] [facility]rapist, [email or web address], password [facility] 10/28/2014, 3:04 PM  LOS: 2 days

## 2014-10-28 NOTE — Consult Note (Addendum)
[place] [facility]  Total days of antibiotics 3        Day 3 vanco        Day 3 levofloxacin               Reason for Consult: rash febrile illness   Referring Physician: rizwan  Active Problems:   Rash   Anemia   Fever   Pyrexia   Cough    HPI: Derek Anthony is a 59 y.o. male  PMHx of  arthritis, DM, HTN, GERD, sickle cell trait, and gout with progressively worsening fever, chills,nighsweats, generalized arthralgias to small joints (hands/wrist) as well as back,  Fatigue, morning cough with productive phlegm, loss of appetite and weight loss of 25# over the last 6-8 wks. He states that after the first snow storm he started to have fever, arthralgias and rash. He saw his PCP in early February, given a course of levofloxacin plus steroids without improvement in his symptoms. He then was referred to allergy who extended steroids, and also at one point switched to doxycycline which did improve his symptoms. He has been off and on steroids and various courses of antibiotics (including coming in on levofloxacin) without improvement. His rash he describes as papules to hands, palms mostly but also scattered to elbows face,ears and feet. During this time period, his hands were exquisitely tender with also having knuckles and wrists being swollen. af. The rash began 4 weeks ago on his bilateral hands/fingers and has spread to his lateral feet, elbows, face, and ears. He describes the rash as blisters which are of varying degrees of healing.his fevers would get up to 101F , also having nightsweats. He would notice having morning cough to clear phlegm, but didn't necessarily cought throughout the day. He denies any CP/SOB, N/V, abdominal pain/no diarrhea. Did have loss of appetite and 20-27# weight loss. He's never experienced anything like this before.no family hx of auto-immune disorder  On admit, did not have any leukocytosis, sed rate at 60 crp 4.1, blood cx ngtd at 48hrs,  ua negative. CT chest abd pelvis is negative. Ana and rf negative. hiv and hepatitis panel negative  Past Medical History  Diagnosis Date  . Diabetes mellitus without complication   . Hypertension   . GERD (gastroesophageal reflux disease)   . Anemia   . Sickle cell trait   . Gout     Allergies:  Allergies  Allergen Reactions  . Sulfa Antibiotics Itching and Rash    Burning also  . Penicillins Itching and Rash    MEDICATIONS: . allopurinol  300 mg Oral Daily  . aspirin EC  81 mg Oral Daily  . feeding supplement (GLUCERNA SHAKE)  237 mL Oral TID BM  . gabapentin  600 mg Oral BID  . insulin aspart  0-5 Units Subcutaneous QHS  . insulin aspart  0-9 Units Subcutaneous TID WC  . insulin glargine  25 Units Subcutaneous QHS  . irbesartan  300 mg Oral Daily  . lidocaine-EPINEPHrine  20 mL Intradermal Once  . loratadine  10 mg Oral Daily  . montelukast  10 mg Oral QHS  . sodium chloride  3 mL Intravenous Q12H  . traMADol  100 mg Oral QHS    History  Substance Use Topics  . Smoking status: Never Smoker   . Smokeless tobacco: Not on file  . Alcohol Use: No    Family History  Problem Relation Age of Onset  . Hypertension Mother   .  Diabetes Mother   . Hypertension Father     Review of Systems -  12 point ros is reviewed, positive pertinents listed in hpi  OBJECTIVE: Temp:  [98.4 F (36.9 C)-100.5 F (38.1 C)] 99.5 F (37.5 C) (03/07 1629) Pulse Rate:  [66-76] 68 (03/07 0426) Resp:  [16-18] 16 (03/07 1629) BP: (131-149)/(54-74) 149/74 mmHg (03/07 1629) SpO2:  [95 %-96 %] 95 % (03/07 1629) Weight:  [227 lb 1.2 oz (103 kg)] 227 lb 1.2 oz (103 kg) (03/07 0426) Physical Exam  Constitutional: He is oriented to person, place, and time. He appears well-developed and well-nourished. No distress.  HENT:  Mouth/Throat: Oropharynx is clear and moist. No oropharyngeal exudate.  Cardiovascular: Normal rate, regular rhythm and normal heart sounds. Exam reveals no gallop and no  friction rub.  No murmur heard.  Pulmonary/Chest: Effort normal and breath sounds normal. No respiratory distress. He has no wheezes.  Abdominal: Soft. Bowel sounds are normal. He exhibits no distension. There is no tenderness.  Lymphadenopathy: no cervical adenopathy.  Neurological: He is alert and oriented to person, place, and time.  Skin: Skin is warm and dry. Healed macule/papules to digits, and palms of hands. + tender. No visible rash to face, ears or feet or legs Ext: swelling to 2nd and 3rd mcp of right hand, left wrist swelling Psychiatric: He has a normal mood and affect. His behavior is normal.    LABS: Results for orders placed or performed during the hospital encounter of 10/26/14 (from the past 48 hour(s))  C-reactive protein     Status: Abnormal   Collection Time: 10/26/14  7:16 PM  Result Value Ref Range   CRP 4.1 (H) <0.60 mg/dL    Comment: Performed at [facility]  Rapid HIV screen [facility])     Status: None   Collection Time: 10/26/14  7:16 PM  Result Value Ref Range   Rapid HIV Screen NON REACTIVE NON REACTIVE  Protime-INR     Status: None   Collection Time: 10/26/14  7:16 PM  Result Value Ref Range   Prothrombin Time 13.6 11.6 - 15.2 seconds   INR 1.03 0.00 - 1.49  Blood culture (routine x 2)     Status: None (Preliminary result)   Collection Time: 10/26/14 10:48 PM  Result Value Ref Range   Specimen Description BLOOD RIGHT HAND    Special Requests BOTTLES DRAWN AEROBIC AND ANAEROBIC 4CC EACH    Culture             BLOOD CULTURE RECEIVED NO GROWTH TO DATE CULTURE WILL BE HELD FOR 5 DAYS BEFORE ISSUING A FINAL NEGATIVE REPORT Performed at [facility]    Report Status PENDING   Lactate dehydrogenase     Status: Abnormal   Collection Time: 10/26/14 10:50 PM  Result Value Ref Range   LDH 296 (H) 94 - 250 U/L  CK     Status: None   Collection Time: 10/26/14 10:50 PM  Result Value Ref Range   Total CK 227 7 - 232 U/L  Antinuclear  Antibodies, IFA     Status: None   Collection Time: 10/26/14 10:50 PM  Result Value Ref Range   ANA Ab, IFA Negative     Comment: (NOTE)                                     Negative   <1:80  Borderline  1:80                                     Positive   >1:80 Performed At: [facility] [place], [place] [id number] Lindon Romp MD [id number]   Rheumatoid factor     Status: None   Collection Time: 10/26/14 10:50 PM  Result Value Ref Range   Rhuematoid fact SerPl-aCnc 13.3 0.0 - 13.9 IU/mL    Comment: (NOTE) Performed At: [facility] [place], [place] [id number] Lindon Romp MD [id number]   B. burgdorfi antibodies     Status: None   Collection Time: 10/26/14 10:50 PM  Result Value Ref Range   B burgdorferi Ab IgG+IgM <0.91 0.00 - 0.90 ISR    Comment: (NOTE)                                Negative         <0.91                                Equivocal  0.91 - 1.09                                Positive         >1.09 Performed At: [facility] [address] [place], [place] [id number] Lindon Romp MD [id number]   Hepatitis panel, acute     Status: None   Collection Time: 10/26/14 10:50 PM  Result Value Ref Range   Hepatitis B Surface Ag NEGATIVE NEGATIVE   HCV Ab NEGATIVE NEGATIVE   Hep A IgM NON REACTIVE NON REACTIVE    Comment: (NOTE) Effective July 08, 2014, Hepatitis Acute Panel (test code 22940) will be revised to automatically reflex to the Hepatitis C Viral RNA, Quantitative, Real-Time PCR assay if the Hepatitis C antibody screening result is Reactive. This action is being taken to ensure that the CDC/USPSTF recommended HCV diagnostic algorithm with the appropriate test reflex needed for accurate interpretation is followed.    Hep B C IgM NON REACTIVE NON REACTIVE    Comment: (NOTE) High levels of Hepatitis B Core IgM antibody are detectable during  the acute stage of Hepatitis B. This antibody is used to differentiate current from past HBV infection. Performed at [facility]   Blood culture (routine x 2)     Status: None (Preliminary result)   Collection Time: 10/26/14 10:53 PM  Result Value Ref Range   Specimen Description BLOOD LEFT HAND    Special Requests BOTTLES DRAWN AEROBIC AND ANAEROBIC 5CC EACH    Culture             BLOOD CULTURE RECEIVED NO GROWTH TO DATE CULTURE WILL BE HELD FOR 5 DAYS BEFORE ISSUING A FINAL NEGATIVE REPORT Performed at [facility]    Report Status PENDING   TSH     Status: None   Collection Time: 10/26/14 11:29 PM  Result Value Ref Range   TSH 1.527 0.350 - 4.500 uIU/mL  Protein electrophoresis, urine     Status: None   Collection Time: 10/27/14  1:38 AM  Result Value Ref Range   Time RANDOM hours    Comment:  CORRECTED ON 03/07 AT 1714: PREVIOUSLY REPORTED AS URINE, CLEAN CATCH, CORRECTED ON 03/06 AT 0144: PREVIOUSLY REPORTED AS 24   Volume, Urine RANDOM mL    Comment: CORRECTED ON 03/07 AT 1714: PREVIOUSLY REPORTED AS URINE, CLEAN CATCH   Total Protein, Urine 25 5 - 25 mg/dL    Comment: No established reference range.   Total Protein, Urine-Ur/day NOT CALC <150 mg/day    Comment: (NOTE) Total urinary protein is determined by adding the albumin and Kappa and/or Lambda light chains.  This value may not agree with the total protein as determined by chemical methods, which characteristically underestimate urinary light chains. Performed at [facility]    Albumin, U PENDING    Alpha 1, Urine PENDING    Alpha 2, Urine PENDING    Beta, Urine PENDING    Gamma Globulin, Urine PENDING    Immunofixation, Urine PENDING   Urinalysis, Routine w reflex microscopic     Status: Abnormal   Collection Time: 10/27/14  1:39 AM  Result Value Ref Range   Color, Urine YELLOW YELLOW   APPearance CLEAR CLEAR   Specific Gravity, Urine 1.039 (H) 1.005 - 1.030   pH 6.0 5.0 - 8.0    Glucose, UA NEGATIVE NEGATIVE mg/dL   Hgb urine dipstick NEGATIVE NEGATIVE   Bilirubin Urine NEGATIVE NEGATIVE   Ketones, ur NEGATIVE NEGATIVE mg/dL   Protein, ur NEGATIVE NEGATIVE mg/dL   Urobilinogen, UA 1.0 0.0 - 1.0 mg/dL   Nitrite NEGATIVE NEGATIVE   Leukocytes, UA NEGATIVE NEGATIVE    Comment: MICROSCOPIC NOT DONE ON URINES WITH NEGATIVE PROTEIN, BLOOD, LEUKOCYTES, NITRITE, OR GLUCOSE <1000 mg/dL.  Hemoglobin A1c     Status: Abnormal   Collection Time: 10/27/14  1:43 AM  Result Value Ref Range   Hgb A1c MFr Bld 8.5 (H) 4.8 - 5.6 %    Comment: (NOTE)         Pre-diabetes: 5.7 - 6.4         Diabetes: >6.4         Glycemic control for adults with diabetes: <7.0    Mean Plasma Glucose 197 mg/dL    Comment: (NOTE) Performed At: [facility] [place], [place] [id number] Lindon Romp MD DJ:[id number]   Glucose, capillary     Status: Abnormal   Collection Time: 10/27/14  2:17 AM  Result Value Ref Range   Glucose-Capillary 137 (H) 70 - 99 mg/dL  Glucose, capillary     Status: Abnormal   Collection Time: 10/27/14  7:55 AM  Result Value Ref Range   Glucose-Capillary 140 (H) 70 - 99 mg/dL  Glucose, capillary     Status: Abnormal   Collection Time: 10/27/14 11:46 AM  Result Value Ref Range   Glucose-Capillary 146 (H) 70 - 99 mg/dL  Glucose, capillary     Status: Abnormal   Collection Time: 10/27/14  5:32 PM  Result Value Ref Range   Glucose-Capillary 193 (H) 70 - 99 mg/dL  Glucose, capillary     Status: Abnormal   Collection Time: 10/27/14  9:07 PM  Result Value Ref Range   Glucose-Capillary 143 (H) 70 - 99 mg/dL  CBC     Status: Abnormal   Collection Time: 10/28/14  6:58 AM  Result Value Ref Range   WBC 5.9 4.0 - 10.5 K/uL   RBC 4.15 (L) 4.22 - 5.81 MIL/uL   Hemoglobin 10.9 (L) 13.0 - 17.0 g/dL   HCT 33.0 (L) 39.0 - 52.0 %   MCV  79.5 78.0 - 100.0 fL   MCH 26.3 26.0 - 34.0 pg   MCHC 33.0 30.0 - 36.0 g/dL   RDW 13.8 11.5 - 15.5 %   Platelets  189 150 - 400 K/uL    Comment: REPEATED TO VERIFY DELTA CHECK NOTED   Basic metabolic panel     Status: Abnormal   Collection Time: 10/28/14  6:58 AM  Result Value Ref Range   Sodium 138 135 - 145 mmol/L   Potassium 3.9 3.5 - 5.1 mmol/L   Chloride 108 96 - 112 mmol/L   CO2 27 19 - 32 mmol/L   Glucose, Bld 153 (H) 70 - 99 mg/dL   BUN 9 6 - 23 mg/dL   Creatinine, Ser 1.01 0.50 - 1.35 mg/dL   Calcium 7.7 (L) 8.4 - 10.5 mg/dL   GFR calc non Af Amer 79 (L) >90 mL/min   GFR calc Af Amer >90 >90 mL/min    Comment: (NOTE) The eGFR has been calculated using the CKD EPI equation. This calculation has not been validated in all clinical situations. eGFR's persistently <90 mL/min signify possible Chronic Kidney Disease.    Anion gap 3 (L) 5 - 15  Glucose, capillary     Status: Abnormal   Collection Time: 10/28/14  7:56 AM  Result Value Ref Range   Glucose-Capillary 137 (H) 70 - 99 mg/dL  Glucose, capillary     Status: Abnormal   Collection Time: 10/28/14 11:56 AM  Result Value Ref Range   Glucose-Capillary 164 (H) 70 - 99 mg/dL    MICRO: 3/5 blood cx ngtd  IMAGING: Ct Chest W Contrast  10/27/2014   CLINICAL DATA:  Acute onset of generalized body aches and fever. Initial encounter.  EXAM: CT CHEST, ABDOMEN, AND PELVIS WITH CONTRAST  TECHNIQUE: Multidetector CT imaging of the chest, abdomen and pelvis was performed following the standard protocol during bolus administration of intravenous contrast.  CONTRAST:  134m OMNIPAQUE IOHEXOL 300 MG/ML  SOLN  COMPARISON:  Renal ultrasound performed 06/26/2013, and MRI of the lumbar spine performed 05/19/2013  FINDINGS: CT CHEST FINDINGS  Mild bibasilar atelectasis or scarring is noted. Scattered blebs are noted within the right upper lobe, and to a lesser extent within the left upper lobe. There is nonspecific minimal peripheral haziness within both lungs, with a ring of peripheral sparing. No pleural effusion or pneumothorax is seen. No mass is  identified.  Scattered coronary artery calcification is seen. The mediastinum is otherwise unremarkable. Visualized mediastinal nodes remain normal in size. No pericardial effusion is identified. The great vessels are grossly unremarkable in appearance. The visualized portions of thyroid gland are unremarkable. No axillary lymphadenopathy is seen.  No acute osseous abnormalities are identified.  CT ABDOMEN AND PELVIS FINDINGS  A calcified granuloma is noted within the right hepatic lobe. The liver and spleen are otherwise unremarkable in appearance for. The gallbladder is within normal limits. The pancreas and adrenal glands are unremarkable.  Bilateral renal scarring is noted. Mild nonspecific perinephric stranding is noted bilaterally. The kidneys are otherwise unremarkable. There is no evidence of hydronephrosis. No renal or ureteral stones are seen.  No free fluid is identified. The small bowel is unremarkable in appearance. The stomach is within normal limits. No acute vascular abnormalities are seen. Scattered calcification is noted along the common iliac arteries bilaterally.  The appendix is normal in caliber, without evidence for appendicitis. Contrast progresses to the level of the distal descending colon. The colon is unremarkable in appearance.  The bladder  is mildly distended and grossly unremarkable. The prostate remains normal in size. No inguinal lymphadenopathy is seen.  No acute osseous abnormalities are identified.  IMPRESSION: 1. No acute abnormality seen to explain the patient's symptoms. 2. Nonspecific minimal peripheral haziness noted within both lungs, with a ring of peripheral sparing. This is of uncertain significance. Mild bibasilar atelectasis or scarring noted. Scattered blebs within the upper lobes, more prominent on the right. 3. Scattered coronary artery calcifications seen. 4. Scattered calcification along the common iliac arteries bilaterally.   Electronically Signed   By: Garald Balding M.D.   On: 10/27/2014 00:30   Ct Abdomen Pelvis W Contrast  10/27/2014   CLINICAL DATA:  Acute onset of generalized body aches and fever. Initial encounter.  EXAM: CT CHEST, ABDOMEN, AND PELVIS WITH CONTRAST  TECHNIQUE: Multidetector CT imaging of the chest, abdomen and pelvis was performed following the standard protocol during bolus administration of intravenous contrast.  CONTRAST:  174m OMNIPAQUE IOHEXOL 300 MG/ML  SOLN  COMPARISON:  Renal ultrasound performed 06/26/2013, and MRI of the lumbar spine performed 05/19/2013  FINDINGS: CT CHEST FINDINGS  Mild bibasilar atelectasis or scarring is noted. Scattered blebs are noted within the right upper lobe, and to a lesser extent within the left upper lobe. There is nonspecific minimal peripheral haziness within both lungs, with a ring of peripheral sparing. No pleural effusion or pneumothorax is seen. No mass is identified.  Scattered coronary artery calcification is seen. The mediastinum is otherwise unremarkable. Visualized mediastinal nodes remain normal in size. No pericardial effusion is identified. The great vessels are grossly unremarkable in appearance. The visualized portions of thyroid gland are unremarkable. No axillary lymphadenopathy is seen.  No acute osseous abnormalities are identified.  CT ABDOMEN AND PELVIS FINDINGS  A calcified granuloma is noted within the right hepatic lobe. The liver and spleen are otherwise unremarkable in appearance for. The gallbladder is within normal limits. The pancreas and adrenal glands are unremarkable.  Bilateral renal scarring is noted. Mild nonspecific perinephric stranding is noted bilaterally. The kidneys are otherwise unremarkable. There is no evidence of hydronephrosis. No renal or ureteral stones are seen.  No free fluid is identified. The small bowel is unremarkable in appearance. The stomach is within normal limits. No acute vascular abnormalities are seen. Scattered calcification is noted along the  common iliac arteries bilaterally.  The appendix is normal in caliber, without evidence for appendicitis. Contrast progresses to the level of the distal descending colon. The colon is unremarkable in appearance.  The bladder is mildly distended and grossly unremarkable. The prostate remains normal in size. No inguinal lymphadenopathy is seen.  No acute osseous abnormalities are identified.  IMPRESSION: 1. No acute abnormality seen to explain the patient's symptoms. 2. Nonspecific minimal peripheral haziness noted within both lungs, with a ring of peripheral sparing. This is of uncertain significance. Mild bibasilar atelectasis or scarring noted. Scattered blebs within the upper lobes, more prominent on the right. 3. Scattered coronary artery calcifications seen. 4. Scattered calcification along the common iliac arteries bilaterally.   Electronically Signed   By: JGarald BaldingM.D.   On: 10/27/2014 00:30    Assessment/Plan: 89yom with 6 week history of febrile rash with arthralgias. He has had several courses of oral antibiotics and low dose steroids without improvement of symptoms. DDX for FUO includes infectious vs. Auto-immune rheum vs. Malignancy. Arthralgias points more towards rheumatologic process. Unintentional weight loss, concerning for malignancy. He has both small joint and large joint involvement by  history, exam at this point is mostly small joints. Imaging of chest/abd/pelvis is unrevealing.  - recommend to rule out infectious process by taking off of antibiotics. And repeat blood cx off of antibiotics in 48hrs. Can consider getting TTE. We will get quantiferon - we will check a gamut of labs: ANCA, anti CCP, HLA B-27, immunoglobulins/protein electropheresis is pending. Agree with biopsy of skin lesion (preferably newest lesion) , will see if we can send blood specimen for t. whipperi PCR testing. - consider speaking with rheumatology to help guide testing and work up through informal  consultation  - weight loss = would have nutrition help quantifiy if moderate malnutrition and see if he is meeting caloric intake  Caren Griffins B. [place] [facility] [phone]

## 2014-10-28 NOTE — Progress Notes (Signed)
INITIAL NUTRITION ASSESSMENT  DOCUMENTATION CODES Per approved criteria  -Not Applicable   INTERVENTION: -Glucerna Shake po TID, each supplement provides 220 kcal and 10 grams of protein  NUTRITION DIAGNOSIS: Inadequate oral intake related to dysphagia, decreased appetite as evidenced by 13% wt loss x 6 months.   Goal: Pt will meet >90% of estimated nutritional needs  Monitor:  PO/supplement intake, labs, weight changes, I/O's  Reason for Assessment: MST=4  59 y.o. male  Admitting Dx: <principal problem not specified>  59 y/o male with PMH of IDDM, HTN, GERD, Gout presented with progressively worsening had trash, blisters associated with fever, chills, generalized pains, weakness, tiredness, loss of appetite for 4 weeks. He reports the rash began 4 weeks ago  ASSESSMENT: Pt reports a general decline in health since September 2015. He has experienced poor appetite and weight loss, which has exacerbated over the past 4 weeks. He estimates he has lost 35# (13.3%) in the past 6 months. Lunch tray was untouched at time of visit. Pt reports that his appetite is poor, but additionally, it is difficult for his to eat because of a sore, scratchy throat and tooth pain. Pt further explains that he is unable to tolerate cold food, due to pain in his teeth. Offered to downgrade diet to mechanically altered consistency, however, pt declined.  Discussed importance of good PO intake to promote healing. Reviewed alternative menu choices that may be more appealing to pt- RD updated pt's dinner order per his request. Pt also amenable to try supplements- pt chose Glucerna due to concerns of suboptimal glycemic control. Latest Hgb A1c: 8.5.  Nutrition-focused physical exam deferred due to pt's severe pain.  Labs reviewed. Calcium: 7.7, Glucose: 153, CBGS: 137-193.   Height: Ht Readings from Last 1 Encounters:  10/26/14 6\' 2"  (1.88 m)    Weight: Wt Readings from Last 1 Encounters:  10/28/14 227 lb  1.2 oz (103 kg)    Ideal Body Weight: 190#   % Ideal Body Weight: 119%  Wt Readings from Last 10 Encounters:  10/28/14 227 lb 1.2 oz (103 kg)  05/21/13 255 lb (115.667 kg)    Usual Body Weight: 255#  % Usual Body Weight: 89%  BMI:  Body mass index is 29.14 kg/(m^2). Overweight  Estimated Nutritional Needs: Kcal: 2100-2300 Protein: 105-115 grams Fluid: 2.1-2.3 L  Skin: rashes on hands, feet, and elbows  Diet Order:  regular diet  EDUCATION NEEDS: -Education needs addressed   Intake/Output Summary (Last 24 hours) at 10/28/14 0928 Last data filed at 10/28/14 0700  Gross per 24 hour  Intake   1110 ml  Output   1300 ml  Net   -190 ml    Last BM: 10/26/14  Labs:   Recent Labs Lab 10/26/14 1408 10/28/14 0658  NA 142 138  K 4.1 3.9  CL 106 108  CO2 32 27  BUN 10 9  CREATININE 1.30 1.01  CALCIUM 8.8 7.7*  GLUCOSE 162* 153*    CBG (last 3)   Recent Labs  10/27/14 1732 10/27/14 2107 10/28/14 0756  GLUCAP 193* 143* 137*   Lab Results  Component Value Date   HGBA1C 8.5* 10/27/2014   Scheduled Meds: . allopurinol  300 mg Oral Daily  . aspirin EC  81 mg Oral Daily  . gabapentin  600 mg Oral BID  . insulin aspart  0-5 Units Subcutaneous QHS  . insulin aspart  0-9 Units Subcutaneous TID WC  . insulin glargine  25 Units Subcutaneous QHS  . irbesartan  300 mg Oral Daily  . loratadine  10 mg Oral Daily  . montelukast  10 mg Oral QHS  . sodium chloride  3 mL Intravenous Q12H  . traMADol  100 mg Oral QHS  . vancomycin  1,000 mg Intravenous Q8H    Continuous Infusions:   Past Medical History  Diagnosis Date  . Diabetes mellitus without complication   . Hypertension   . GERD (gastroesophageal reflux disease)   . Anemia   . Sickle cell trait   . Gout     Past Surgical History  Procedure Laterality Date  . Shoulder surgery    . Knee reconstruction Right   . Vasectomy      Keiosha Cancro A. Mayford Knife, RD, LDN, CDE Pager: [phone] After hours  Pager: [phone]

## 2014-10-28 NOTE — Consult Note (Signed)
[facility] Surgery Consult Note  Derek Anthony Aug 06, 1956  [id number].    Requesting MD: Dr. Wynelle Cleveland Chief Complaint/Reason for Consult: Rash on hands  HPI:  59 y/o AA male with PMH arthritis, DM, HTN, GERD, sickle cell trait, and gout with progressively worsening fever, chills, generalized pains, weakness, fatigue, cough, yellow sputum, loss of appetite for 4 weeks.  The rash began 4 weeks ago on his bilateral hands/fingers and has spread to his lateral feet, elbows, face, and ears.  He describes the rash as blisters which are of varying degrees of healing.  He notes they are painful and his left hand is swollen.  He had been started on prednisone from a allergist in the community which has since been discontinued since it wasn't helping.  We were asked to complete a punch biopsy of his hand.  He denies any CP/SOB, N/V, abdominal pain.  He's never experienced anything like this before.  Denies any h/o skin rashes in the past.  No radiating pain, no precipitating/alleviating factors.   ROS: All systems reviewed and otherwise negative except for as above  Family History  Problem Relation Age of Onset  . Hypertension Mother   . Diabetes Mother   . Hypertension Father     Past Medical History  Diagnosis Date  . Diabetes mellitus without complication   . Hypertension   . GERD (gastroesophageal reflux disease)   . Anemia   . Sickle cell trait   . Gout     Past Surgical History  Procedure Laterality Date  . Shoulder surgery    . Knee reconstruction Right   . Vasectomy      Social History:  reports that he has never smoked. He does not have any smokeless tobacco history on file. He reports that he does not drink alcohol or use illicit drugs.  Allergies:  Allergies  Allergen Reactions  . Sulfa Antibiotics Itching and Rash    Burning also  . Penicillins Itching and Rash    Medications Prior to Admission  Medication Sig Dispense Refill  . allopurinol (ZYLOPRIM) 300 MG  tablet Take 300 mg by mouth daily.    Marland Kitchen aspirin EC 81 MG tablet Take 81 mg by mouth daily.    . cetirizine (ZYRTEC) 10 MG tablet Take 10 mg by mouth every morning.     . fluticasone (FLONASE) 50 MCG/ACT nasal spray Place 2 sprays into both nostrils daily.    Marland Kitchen gabapentin (NEURONTIN) 600 MG tablet Take 600 mg by mouth 2 (two) times daily.    . insulin aspart protamine- aspart (NOVOLOG MIX 70/30) (70-30) 100 UNIT/ML injection Inject 20-30 Units into the skin 2 (two) times daily. 30 units in the morning and 20 units in the evening    . insulin glargine (LANTUS) 100 UNIT/ML injection Inject 25 Units into the skin at bedtime.    Marland Kitchen levofloxacin (LEVAQUIN) 500 MG tablet Take 500 mg by mouth daily. Started 10/20/14, for 7 days ending 10/26/14  0  . montelukast (SINGULAIR) 10 MG tablet Take 10 mg by mouth at bedtime.    Marland Kitchen olmesartan (BENICAR) 40 MG tablet Take 40 mg by mouth daily.    Marland Kitchen olmesartan (BENICAR) 40 MG tablet Take 40 mg by mouth daily.    . predniSONE (DELTASONE) 5 MG tablet Take 5 mg by mouth daily.  0  . traMADol (ULTRAM) 50 MG tablet Take 100 mg by mouth at bedtime.       Blood pressure 136/64, pulse 68, temperature 98.4 F (  36.9 C), temperature source Oral, resp. rate 18, height $RemoveBe'6\' 2"'daIVwcacG$  (1.88 m), weight 227 lb 1.2 oz (103 kg), SpO2 95 %. Physical Exam: General: pleasant, WD/WN AA male who is laying in bed in NAD HEENT: head is normocephalic, atraumatic.  Sclera are noninjected.  Ears and nose with vesicular erythematous lesions.  Mouth is pink. Lungs: breathing non-labored Abd: soft, NT/ND, no masses MS:  Left hand with swelling, CSM intact to all 4 extremities Skin: warm and dry, erythematous vesicular lesions of the palmar aspect of the hands, fingers, wrists, elbows, ears, face, and lateral aspect of 5th digits on feet Psych: A&Ox3 with an appropriate affect.   Results for orders placed or performed during the hospital encounter of 10/26/14 (from the past 48 hour(s))  C-reactive  protein     Status: Abnormal   Collection Time: 10/26/14  7:16 PM  Result Value Ref Range   CRP 4.1 (H) <0.60 mg/dL    Comment: Performed at [place] [facility])     Status: None   Collection Time: 10/26/14  7:16 PM  Result Value Ref Range   Rapid HIV Screen NON REACTIVE NON REACTIVE  Protime-INR     Status: None   Collection Time: 10/26/14  7:16 PM  Result Value Ref Range   Prothrombin Time 13.6 11.6 - 15.2 seconds   INR 1.03 0.00 - 1.49  Blood culture (routine x 2)     Status: None (Preliminary result)   Collection Time: 10/26/14 10:48 PM  Result Value Ref Range   Specimen Description BLOOD RIGHT HAND    Special Requests BOTTLES DRAWN AEROBIC AND ANAEROBIC 4CC EACH    Culture             BLOOD CULTURE RECEIVED NO GROWTH TO DATE CULTURE WILL BE HELD FOR 5 DAYS BEFORE ISSUING A FINAL NEGATIVE REPORT Performed at [facility]    Report Status PENDING   Lactate dehydrogenase     Status: Abnormal   Collection Time: 10/26/14 10:50 PM  Result Value Ref Range   LDH 296 (H) 94 - 250 U/L  CK     Status: None   Collection Time: 10/26/14 10:50 PM  Result Value Ref Range   Total CK 227 7 - 232 U/L  Rheumatoid factor     Status: None   Collection Time: 10/26/14 10:50 PM  Result Value Ref Range   Rhuematoid fact SerPl-aCnc 13.3 0.0 - 13.9 IU/mL    Comment: (NOTE) Performed At: [facility] [place], [place] [id number] Lindon Romp MD MC:[id number]   B. Claris Che antibodies     Status: None   Collection Time: 10/26/14 10:50 PM  Result Value Ref Range   B burgdorferi Ab IgG+IgM <0.91 0.00 - 0.90 ISR    Comment: (NOTE)                                Negative         <0.91                                Equivocal  0.91 - 1.09                                Positive         >1.09  Performed At: [facility] [place], [place] [id number] Lindon Romp MD [id number]   Hepatitis panel, acute      Status: None   Collection Time: 10/26/14 10:50 PM  Result Value Ref Range   Hepatitis B Surface Ag NEGATIVE NEGATIVE   HCV Ab NEGATIVE NEGATIVE   Hep A IgM NON REACTIVE NON REACTIVE    Comment: (NOTE) Effective July 08, 2014, Hepatitis Acute Panel (test code [id number]) will be revised to automatically reflex to the Hepatitis C Viral RNA, Quantitative, Real-Time PCR assay if the Hepatitis C antibody screening result is Reactive. This action is being taken to ensure that the CDC/USPSTF recommended HCV diagnostic algorithm with the appropriate test reflex needed for accurate interpretation is followed.    Hep B C IgM NON REACTIVE NON REACTIVE    Comment: (NOTE) High levels of Hepatitis B Core IgM antibody are detectable during the acute stage of Hepatitis B. This antibody is used to differentiate current from past HBV infection. Performed at [facility]   Blood culture (routine x 2)     Status: None (Preliminary result)   Collection Time: 10/26/14 10:53 PM  Result Value Ref Range   Specimen Description BLOOD LEFT HAND    Special Requests BOTTLES DRAWN AEROBIC AND ANAEROBIC 5CC EACH    Culture             BLOOD CULTURE RECEIVED NO GROWTH TO DATE CULTURE WILL BE HELD FOR 5 DAYS BEFORE ISSUING A FINAL NEGATIVE REPORT Performed at [facility]    Report Status PENDING   TSH     Status: None   Collection Time: 10/26/14 11:29 PM  Result Value Ref Range   TSH 1.527 0.350 - 4.500 uIU/mL  Urinalysis, Routine w reflex microscopic     Status: Abnormal   Collection Time: 10/27/14  1:39 AM  Result Value Ref Range   Color, Urine YELLOW YELLOW   APPearance CLEAR CLEAR   Specific Gravity, Urine 1.039 (H) 1.005 - 1.030   pH 6.0 5.0 - 8.0   Glucose, UA NEGATIVE NEGATIVE mg/dL   Hgb urine dipstick NEGATIVE NEGATIVE   Bilirubin Urine NEGATIVE NEGATIVE   Ketones, ur NEGATIVE NEGATIVE mg/dL   Protein, ur NEGATIVE NEGATIVE mg/dL   Urobilinogen, UA 1.0 0.0 - 1.0 mg/dL   Nitrite  NEGATIVE NEGATIVE   Leukocytes, UA NEGATIVE NEGATIVE    Comment: MICROSCOPIC NOT DONE ON URINES WITH NEGATIVE PROTEIN, BLOOD, LEUKOCYTES, NITRITE, OR GLUCOSE <1000 mg/dL.  Hemoglobin A1c     Status: Abnormal   Collection Time: 10/27/14  1:43 AM  Result Value Ref Range   Hgb A1c MFr Bld 8.5 (H) 4.8 - 5.6 %    Comment: (NOTE)         Pre-diabetes: 5.7 - 6.4         Diabetes: >6.4         Glycemic control for adults with diabetes: <7.0    Mean Plasma Glucose 197 mg/dL    Comment: (NOTE) Performed At: [facility] [place], [place] [id number] Lindon Romp MD RC:[id number]   Glucose, capillary     Status: Abnormal   Collection Time: 10/27/14  2:17 AM  Result Value Ref Range   Glucose-Capillary 137 (H) 70 - 99 mg/dL  Glucose, capillary     Status: Abnormal   Collection Time: 10/27/14  7:55 AM  Result Value Ref Range   Glucose-Capillary 140 (H) 70 - 99 mg/dL  Glucose, capillary  Status: Abnormal   Collection Time: 10/27/14 11:46 AM  Result Value Ref Range   Glucose-Capillary 146 (H) 70 - 99 mg/dL  Glucose, capillary     Status: Abnormal   Collection Time: 10/27/14  5:32 PM  Result Value Ref Range   Glucose-Capillary 193 (H) 70 - 99 mg/dL  Glucose, capillary     Status: Abnormal   Collection Time: 10/27/14  9:07 PM  Result Value Ref Range   Glucose-Capillary 143 (H) 70 - 99 mg/dL  CBC     Status: Abnormal   Collection Time: 10/28/14  6:58 AM  Result Value Ref Range   WBC 5.9 4.0 - 10.5 K/uL   RBC 4.15 (L) 4.22 - 5.81 MIL/uL   Hemoglobin 10.9 (L) 13.0 - 17.0 g/dL   HCT 33.0 (L) 39.0 - 52.0 %   MCV 79.5 78.0 - 100.0 fL   MCH 26.3 26.0 - 34.0 pg   MCHC 33.0 30.0 - 36.0 g/dL   RDW 13.8 11.5 - 15.5 %   Platelets 189 150 - 400 K/uL    Comment: REPEATED TO VERIFY DELTA CHECK NOTED   Basic metabolic panel     Status: Abnormal   Collection Time: 10/28/14  6:58 AM  Result Value Ref Range   Sodium 138 135 - 145 mmol/L   Potassium 3.9 3.5 - 5.1 mmol/L    Chloride 108 96 - 112 mmol/L   CO2 27 19 - 32 mmol/L   Glucose, Bld 153 (H) 70 - 99 mg/dL   BUN 9 6 - 23 mg/dL   Creatinine, Ser 1.01 0.50 - 1.35 mg/dL   Calcium 7.7 (L) 8.4 - 10.5 mg/dL   GFR calc non Af Amer 79 (L) >90 mL/min   GFR calc Af Amer >90 >90 mL/min    Comment: (NOTE) The eGFR has been calculated using the CKD EPI equation. This calculation has not been validated in all clinical situations. eGFR's persistently <90 mL/min signify possible Chronic Kidney Disease.    Anion gap 3 (L) 5 - 15  Glucose, capillary     Status: Abnormal   Collection Time: 10/28/14  7:56 AM  Result Value Ref Range   Glucose-Capillary 137 (H) 70 - 99 mg/dL  Glucose, capillary     Status: Abnormal   Collection Time: 10/28/14 11:56 AM  Result Value Ref Range   Glucose-Capillary 164 (H) 70 - 99 mg/dL   Dg Chest 2 View  10/26/2014   CLINICAL DATA:  Fever.  Generalized body aches.  Diabetes.  Cough.  EXAM: CHEST  2 VIEW  COMPARISON:  None.  FINDINGS: Lateral view degraded by patient arm position and mild motion. Midline trachea. Borderline cardiomegaly. Mediastinal contours otherwise within normal limits. No pleural effusion or pneumothorax. Clear lungs.  IMPRESSION: Borderline cardiomegaly, without acute disease.  Degraded lateral view.   Electronically Signed   By: Abigail Miyamoto M.D.   On: 10/26/2014 17:20   Ct Chest W Contrast  10/27/2014   CLINICAL DATA:  Acute onset of generalized body aches and fever. Initial encounter.  EXAM: CT CHEST, ABDOMEN, AND PELVIS WITH CONTRAST  TECHNIQUE: Multidetector CT imaging of the chest, abdomen and pelvis was performed following the standard protocol during bolus administration of intravenous contrast.  CONTRAST:  137mL OMNIPAQUE IOHEXOL 300 MG/ML  SOLN  COMPARISON:  Renal ultrasound performed 06/26/2013, and MRI of the lumbar spine performed 05/19/2013  FINDINGS: CT CHEST FINDINGS  Mild bibasilar atelectasis or scarring is noted. Scattered blebs are noted within the  right upper lobe,  and to a lesser extent within the left upper lobe. There is nonspecific minimal peripheral haziness within both lungs, with a ring of peripheral sparing. No pleural effusion or pneumothorax is seen. No mass is identified.  Scattered coronary artery calcification is seen. The mediastinum is otherwise unremarkable. Visualized mediastinal nodes remain normal in size. No pericardial effusion is identified. The great vessels are grossly unremarkable in appearance. The visualized portions of thyroid gland are unremarkable. No axillary lymphadenopathy is seen.  No acute osseous abnormalities are identified.  CT ABDOMEN AND PELVIS FINDINGS  A calcified granuloma is noted within the right hepatic lobe. The liver and spleen are otherwise unremarkable in appearance for. The gallbladder is within normal limits. The pancreas and adrenal glands are unremarkable.  Bilateral renal scarring is noted. Mild nonspecific perinephric stranding is noted bilaterally. The kidneys are otherwise unremarkable. There is no evidence of hydronephrosis. No renal or ureteral stones are seen.  No free fluid is identified. The small bowel is unremarkable in appearance. The stomach is within normal limits. No acute vascular abnormalities are seen. Scattered calcification is noted along the common iliac arteries bilaterally.  The appendix is normal in caliber, without evidence for appendicitis. Contrast progresses to the level of the distal descending colon. The colon is unremarkable in appearance.  The bladder is mildly distended and grossly unremarkable. The prostate remains normal in size. No inguinal lymphadenopathy is seen.  No acute osseous abnormalities are identified.  IMPRESSION: 1. No acute abnormality seen to explain the patient's symptoms. 2. Nonspecific minimal peripheral haziness noted within both lungs, with a ring of peripheral sparing. This is of uncertain significance. Mild bibasilar atelectasis or scarring noted.  Scattered blebs within the upper lobes, more prominent on the right. 3. Scattered coronary artery calcifications seen. 4. Scattered calcification along the common iliac arteries bilaterally.   Electronically Signed   By: Garald Balding M.D.   On: 10/27/2014 00:30   Ct Abdomen Pelvis W Contrast  10/27/2014   CLINICAL DATA:  Acute onset of generalized body aches and fever. Initial encounter.  EXAM: CT CHEST, ABDOMEN, AND PELVIS WITH CONTRAST  TECHNIQUE: Multidetector CT imaging of the chest, abdomen and pelvis was performed following the standard protocol during bolus administration of intravenous contrast.  CONTRAST:  119mL OMNIPAQUE IOHEXOL 300 MG/ML  SOLN  COMPARISON:  Renal ultrasound performed 06/26/2013, and MRI of the lumbar spine performed 05/19/2013  FINDINGS: CT CHEST FINDINGS  Mild bibasilar atelectasis or scarring is noted. Scattered blebs are noted within the right upper lobe, and to a lesser extent within the left upper lobe. There is nonspecific minimal peripheral haziness within both lungs, with a ring of peripheral sparing. No pleural effusion or pneumothorax is seen. No mass is identified.  Scattered coronary artery calcification is seen. The mediastinum is otherwise unremarkable. Visualized mediastinal nodes remain normal in size. No pericardial effusion is identified. The great vessels are grossly unremarkable in appearance. The visualized portions of thyroid gland are unremarkable. No axillary lymphadenopathy is seen.  No acute osseous abnormalities are identified.  CT ABDOMEN AND PELVIS FINDINGS  A calcified granuloma is noted within the right hepatic lobe. The liver and spleen are otherwise unremarkable in appearance for. The gallbladder is within normal limits. The pancreas and adrenal glands are unremarkable.  Bilateral renal scarring is noted. Mild nonspecific perinephric stranding is noted bilaterally. The kidneys are otherwise unremarkable. There is no evidence of hydronephrosis. No renal  or ureteral stones are seen.  No free fluid is identified. The small  bowel is unremarkable in appearance. The stomach is within normal limits. No acute vascular abnormalities are seen. Scattered calcification is noted along the common iliac arteries bilaterally.  The appendix is normal in caliber, without evidence for appendicitis. Contrast progresses to the level of the distal descending colon. The colon is unremarkable in appearance.  The bladder is mildly distended and grossly unremarkable. The prostate remains normal in size. No inguinal lymphadenopathy is seen.  No acute osseous abnormalities are identified.  IMPRESSION: 1. No acute abnormality seen to explain the patient's symptoms. 2. Nonspecific minimal peripheral haziness noted within both lungs, with a ring of peripheral sparing. This is of uncertain significance. Mild bibasilar atelectasis or scarring noted. Scattered blebs within the upper lobes, more prominent on the right. 3. Scattered coronary artery calcifications seen. 4. Scattered calcification along the common iliac arteries bilaterally.   Electronically Signed   By: Garald Balding M.D.   On: 10/27/2014 00:30      Assessment/Plan Rash of unknown origin Fever Sore throat Myalgia/arthralgias  Plan: 1.  Was asked to perform a punch biopsy by Dr. Wynelle Cleveland, will order supplies, and have them delivered today so we can proceed with the procedure at bedside tomorrow 2.  Consent form   Coralie Keens [facility] Surgery 10/28/2014, 4:07 PM Pager: [phone]

## 2014-10-29 DIAGNOSIS — E119 Type 2 diabetes mellitus without complications: Secondary | ICD-10-CM

## 2014-10-29 DIAGNOSIS — I10 Essential (primary) hypertension: Secondary | ICD-10-CM

## 2014-10-29 DIAGNOSIS — E1310 Other specified diabetes mellitus with ketoacidosis without coma: Secondary | ICD-10-CM

## 2014-10-29 LAB — GLUCOSE, CAPILLARY
GLUCOSE-CAPILLARY: 107 mg/dL — AB (ref 70–99)
GLUCOSE-CAPILLARY: 149 mg/dL — AB (ref 70–99)
GLUCOSE-CAPILLARY: 116 mg/dL — AB (ref 70–99)
Glucose-Capillary: 215 mg/dL — ABNORMAL HIGH (ref 70–99)

## 2014-10-29 LAB — ANCA TITERS: Atypical P-ANCA titer: <1:20 {titer}

## 2014-10-29 LAB — UIFE/LIGHT CHAINS/TP QN, 24-HR UR
Albumin, U: DETECTED
Alpha 1, Urine: DETECTED — AB
Alpha 2, Urine: DETECTED — AB
BETA UR: DETECTED — AB
Gamma Globulin, Urine: DETECTED — AB
Total Protein, Urine: 25 mg/dL (ref 5–25)

## 2014-10-29 LAB — MPO/PR-3 (ANCA) ANTIBODIES: ANCA Proteinase 3: <3.5 U/mL (ref 0.0–3.5)

## 2014-10-29 LAB — PROTEIN ELECTROPHORESIS, SERUM
ALBUMIN ELP: 48 % — AB (ref 55.8–66.1)
ALPHA-1-GLOBULIN: 7.4 % — AB (ref 2.9–4.9)
Alpha-2-Globulin: 16.5 % — ABNORMAL HIGH (ref 7.1–11.8)
Beta 2: 7.1 % — ABNORMAL HIGH (ref 3.2–6.5)
Beta Globulin: 6.1 % (ref 4.7–7.2)
GAMMA GLOBULIN: 14.9 % (ref 11.1–18.8)
M-Spike, %: NOT DETECTED g/dL
Total Protein ELP: 5.7 g/dL — ABNORMAL LOW (ref 6.0–8.3)

## 2014-10-29 MED ORDER — PHENOL 1.4 % MT LIQD
1.0000 | OROMUCOSAL | Status: DC | PRN
Start: 2014-10-29 — End: 2014-11-01
  Administered 2014-10-29: 1 via OROMUCOSAL
  Filled 2014-10-29: qty 177

## 2014-10-29 NOTE — Plan of Care (Signed)
Problem: Phase I Progression Outcomes Goal: OOB as tolerated unless otherwise ordered Outcome: Completed/Met Date Met:  10/29/14 Patient up ambulating in room and to bathroom without assistance

## 2014-10-29 NOTE — Progress Notes (Signed)
[facility] PROGRESS NOTE  Derek Anthony JIR:[id number] DOB: 02/23/1956 DOA: 10/26/2014 PCP: No primary care provider on file.  Assessment/Plan: 59 y/o male with PMH of IDDM, HTN, GERD, Gout presented with progressively worsening of blisters associated with fever, chills, generalized pains, weakness, tiredness, loss of appetite for 4 weeks. He reports the rash began 4 weeks ago. He has been Dr. Eliot Ford (an allergist) has been testing for the rash, and received oral prednisone; Patient noted to be febrile in ED. ER physician d/w Dr. Linus Salmons who recommended to admit to r/o infectious process.  Subjective:  No change in physical symptoms.    Assessment/ Plan:  1. Fever, hand rash, constitutional symptoms,cough with yellow sputum and sore throat -  no significant leukocytosis,  -patient started on IV Levaquin and Vanc- blood cultures neg- stop Levaquin and Vanc today as no improvement in symptoms from it - is on Prednisone 5 mg daily started by allergist- will stop this a well as it does not seem to be helping - continues to have a fever and rash- last temp 100 on 3/7 at 12 AM - CRP elevated at 4.1, ESR 60 - RF- neg - B burg neg - HIV neg - RPR neg -  RMSF titers, ANA, ANCA CCP, ECHO pend  - have formally asked for ID consult- Dr Baxter Flattery has low suspicious of infectious etiology - Sx performed skin biopsy today-  - I spoke with Dr Neldon Mc today- he has faxed over lab results obtained in his office which I have reviewed with Dr Baxter Flattery- ANA is positive but no titer's reported Sjogrens SSA is 3.8 (0- 0.9) and ESR and CPK are elevated as they are here - will discuss with Dr Charlestine Night (rheum)  2. IDDM - cont insulin regimen, HA1c 8.5 - sugars stable here  3. HTN, stable - cont ARB    Code Status: full Family Communication: d/w patient Disposition Plan: home pend clinical improvement  DVT prophylaxis: SCDs   Consultants:  ID  surgery  Procedures:  ECHO: 10/28/14 Left ventricle:  The cavity size was normal. There was moderate concentric hypertrophy. Systolic function was normal. The estimated ejection fraction was in the range of 50% to 55%. Wall motion was normal; there were no regional wall motion abnormalities. Doppler parameters are consistent with abnormal left ventricular relaxation (grade 1 diastolic dysfunction). There was no evidence of elevated ventricular filling pressure by Doppler parameters. - Aortic valve: Trileaflet; mildly thickened leaflets. There was trivial regurgitation. - Aortic root: The aortic root was normal in size. - Mitral valve: Mildly thickened leaflets . - Left atrium: The atrium was mildly dilated. - Right ventricle: Systolic function was normal. - Right atrium: The atrium was normal in size. - Tricuspid valve: There was mild regurgitation. - Pulmonic valve: Structurally normal valve. There was no regurgitation. - Pulmonary arteries: Systolic pressure was within the normal range. - Inferior vena cava: The vessel was normal in size. - Pericardium, extracardiac: There was no pericardial effusion.  Antibiotics:  levofloxacin IV 3/5>> 3/7  vanc 3/5>>>> 3/7    Objective: Filed Vitals:   10/29/14 0504  BP: 150/70  Pulse: 71  Temp: 100.1 F (37.8 C)  Resp: 18    Intake/Output Summary (Last 24 hours) at 10/29/14 1213 Last data filed at 10/29/14 0919  Gross per 24 hour  Intake    600 ml  Output   1403 ml  Net   -803 ml   Filed Weights   10/27/14 0045 10/28/14 0426 10/29/14 7510  Weight: 102.8 kg (226 lb 10.1 oz) 103 kg (227 lb 1.2 oz) 102.8 kg (226 lb 10.1 oz)    Exam:   General:  alert  Cardiovascular: s1,s2 rrr  Respiratory: CTA BL  Abdomen: soft, nt,nd   Musculoskeletal: no no leg edema   Skin: vesicular appearing rash in various stages on palms and elbows- blemished from old rash noted   Data Reviewed: Basic Metabolic Panel:  Recent Labs Lab 10/26/14 1408 10/28/14 0658   NA 142 138  K 4.1 3.9  CL 106 108  CO2 32 27  GLUCOSE 162* 153*  BUN 10 9  CREATININE 1.30 1.01  CALCIUM 8.8 7.7*   Liver Function Tests:  Recent Labs Lab 10/26/14 1408  AST 35  ALT 24  ALKPHOS 56  BILITOT 1.0  PROT 6.8  ALBUMIN 2.9*   No results for input(s): LIPASE, AMYLASE in the last 168 hours. No results for input(s): AMMONIA in the last 168 hours. CBC:  Recent Labs Lab 10/26/14 1408 10/28/14 0658  WBC 7.3 5.9  NEUTROABS 5.8  --   HGB 12.9* 10.9*  HCT 39.5 33.0*  MCV 79.2 79.5  PLT 281 189   Cardiac Enzymes:  Recent Labs Lab 10/26/14 2250  CKTOTAL 227   BNP (last 3 results) No results for input(s): BNP in the last 8760 hours.  ProBNP (last 3 results) No results for input(s): PROBNP in the last 8760 hours.  CBG:  Recent Labs Lab 10/28/14 1156 10/28/14 1746 10/28/14 2144 10/29/14 0809 10/29/14 1203  GLUCAP 164* 148* 162* 107* 149*    Recent Results (from the past 240 hour(s))  Blood culture (routine x 2)     Status: None (Preliminary result)   Collection Time: 10/26/14 10:48 PM  Result Value Ref Range Status   Specimen Description BLOOD RIGHT HAND  Final   Special Requests BOTTLES DRAWN AEROBIC AND ANAEROBIC 4CC EACH  Final   Culture   Final           BLOOD CULTURE RECEIVED NO GROWTH TO DATE CULTURE WILL BE HELD FOR 5 DAYS BEFORE ISSUING A FINAL NEGATIVE REPORT Performed at [facility]    Report Status PENDING  Incomplete  Blood culture (routine x 2)     Status: None (Preliminary result)   Collection Time: 10/26/14 10:53 PM  Result Value Ref Range Status   Specimen Description BLOOD LEFT HAND  Final   Special Requests BOTTLES DRAWN AEROBIC AND ANAEROBIC 5CC EACH  Final   Culture   Final           BLOOD CULTURE RECEIVED NO GROWTH TO DATE CULTURE WILL BE HELD FOR 5 DAYS BEFORE ISSUING A FINAL NEGATIVE REPORT Performed at [facility]    Report Status PENDING  Incomplete     Studies: No results found.  Scheduled  Meds: . allopurinol  300 mg Oral Daily  . aspirin EC  81 mg Oral Daily  . feeding supplement (GLUCERNA SHAKE)  237 mL Oral TID BM  . gabapentin  600 mg Oral BID  . insulin aspart  0-5 Units Subcutaneous QHS  . insulin aspart  0-9 Units Subcutaneous TID WC  . insulin glargine  25 Units Subcutaneous QHS  . irbesartan  300 mg Oral Daily  . loratadine  10 mg Oral Daily  . montelukast  10 mg Oral QHS  . sodium chloride  3 mL Intravenous Q12H  . traMADol  100 mg Oral QHS   Continuous Infusions:   Time spent: >35 minutes  [facility]  [facility], [email or web address], password [facility] 10/29/2014, 12:13 PM  LOS: 3 days

## 2014-10-29 NOTE — Procedures (Signed)
Pre-op Diagnosis: Rash of unknown cause Post-op Diagnosis: Same Procedure: Skin punch biopsy of left hand lesion Surgeons: Aris GeorgiaMegan Dort PA-C Findings: Rash of unknown cause Anesthesia: Local with 1cc 2% Lidocaine with epinephrine Fluids: N/A Estimated blood loss: <671mL Drains: N/A Specimens: None Closure:  1 silk interrupted suture Complications: None Condition: Stable, good hemostasis  Procedure Details: Informed patient of risks (including those of bleeding, infection, and injury to other structures), benefits of procedure, and alternatives to the procedure.  All questions were sought and answered.  Written and verbal consent given by Mr. Derek Anthony to proceed with the procedure.  Sterile technique for procedure was done. Injected 1mL of 2% lidocaine with epinephrine as a field block. Performed a 4mm punch biopsy and placed it in formalin specimen jar. 1 3-0 silk simple interrupted suture was use to close the skin wound.  Applied gauze and tape and applied pressure for 5 minutes.    Change dressing as needed.  The suture can be removed in 7 days.  The patients results will be given to Dr. Butler Denmarkizwan and Dr. Drue SecondSnider.  He should follow up with his PCP for results if not back prior to discharge.  No need to follow up with general surgery.  Agree with prompt referrals to Dermatology and Rheumatology.  Differential diagnosis should also include porphyria along with viral and rheumatologic conditions.   Aris GeorgiaMegan Dort, PA-C [facility] Office: [phone] Pager:  [phone]

## 2014-10-30 ENCOUNTER — Encounter (HOSPITAL_COMMUNITY): Payer: Self-pay | Admitting: Adult Health

## 2014-10-30 DIAGNOSIS — M2550 Pain in unspecified joint: Secondary | ICD-10-CM | POA: Insufficient documentation

## 2014-10-30 DIAGNOSIS — R682 Dry mouth, unspecified: Secondary | ICD-10-CM

## 2014-10-30 DIAGNOSIS — R21 Rash and other nonspecific skin eruption: Principal | ICD-10-CM

## 2014-10-30 DIAGNOSIS — R05 Cough: Secondary | ICD-10-CM

## 2014-10-30 LAB — CBC WITH DIFFERENTIAL/PLATELET
BASOS ABS: 0 10*3/uL (ref 0.0–0.1)
BASOS PCT: 0 % (ref 0–1)
EOS PCT: 0 % (ref 0–5)
Eosinophils Absolute: 0 10*3/uL (ref 0.0–0.7)
HEMATOCRIT: 34.5 % — AB (ref 39.0–52.0)
HEMOGLOBIN: 11.1 g/dL — AB (ref 13.0–17.0)
LYMPHS ABS: 0.7 10*3/uL (ref 0.7–4.0)
Lymphocytes Relative: 7 % — ABNORMAL LOW (ref 12–46)
MCH: 25.6 pg — AB (ref 26.0–34.0)
MCHC: 32.2 g/dL (ref 30.0–36.0)
MCV: 79.7 fL (ref 78.0–100.0)
Monocytes Absolute: 0.3 10*3/uL (ref 0.1–1.0)
Monocytes Relative: 3 % (ref 3–12)
NEUTROS ABS: 8 10*3/uL — AB (ref 1.7–7.7)
Neutrophils Relative %: 90 % — ABNORMAL HIGH (ref 43–77)
Platelets: 224 10*3/uL (ref 150–400)
RBC: 4.33 MIL/uL (ref 4.22–5.81)
RDW: 13.7 % (ref 11.5–15.5)
WBC: 8.9 10*3/uL (ref 4.0–10.5)

## 2014-10-30 LAB — LIPID PANEL
CHOL/HDL RATIO: 7.6 ratio
CHOLESTEROL: 214 mg/dL — AB (ref 0–200)
HDL: 28 mg/dL — AB (ref >39)
LDL Cholesterol: 151 mg/dL — ABNORMAL HIGH (ref 0–99)
Triglycerides: 175 mg/dL — ABNORMAL HIGH (ref <150)
VLDL: 35 mg/dL (ref 0–40)

## 2014-10-30 LAB — URIC ACID: Uric Acid, Serum: 3.6 mg/dL — ABNORMAL LOW (ref 4.0–7.8)

## 2014-10-30 LAB — GLUCOSE, CAPILLARY
GLUCOSE-CAPILLARY: 93 mg/dL (ref 70–99)
Glucose-Capillary: 206 mg/dL — ABNORMAL HIGH (ref 70–99)
Glucose-Capillary: 316 mg/dL — ABNORMAL HIGH (ref 70–99)
Glucose-Capillary: 340 mg/dL — ABNORMAL HIGH (ref 70–99)

## 2014-10-30 LAB — ANCA TITERS
C-ANCA: <1:20 {titer}
P-ANCA: <1:20 {titer}

## 2014-10-30 LAB — MPO/PR-3 (ANCA) ANTIBODIES
ANCA Proteinase 3: <3.5 U/mL (ref 0.0–3.5)
Myeloperoxidase Abs: <9 U/mL (ref 0.0–9.0)

## 2014-10-30 LAB — S CEREVISIAE ABS
S CEREVISIAE IGA AB: 9.1 U (ref <=20.0)
S cerevisiae IgG ab: 5.6 U (ref <=20.0)

## 2014-10-30 LAB — QUANTIFERON TB GOLD ASSAY (BLOOD)

## 2014-10-30 LAB — HLA-B27 ANTIGEN: DNA RESULT: NOT DETECTED

## 2014-10-30 MED ORDER — INSULIN ASPART 100 UNIT/ML ~~LOC~~ SOLN
0.0000 [IU] | Freq: Every day | SUBCUTANEOUS | Status: DC
  Administered 2014-10-30: 5 [IU] via SUBCUTANEOUS
  Administered 2014-10-31: 4 [IU] via SUBCUTANEOUS

## 2014-10-30 MED ORDER — PREDNISONE 50 MG PO TABS
60.0000 mg | ORAL_TABLET | Freq: Every day | ORAL | Status: DC
  Administered 2014-10-31: 60 mg via ORAL
  Filled 2014-10-30 (×2): qty 1

## 2014-10-30 MED ORDER — FLUTICASONE PROPIONATE 50 MCG/ACT NA SUSP
1.0000 | Freq: Every day | NASAL | Status: DC
  Administered 2014-10-30 – 2014-11-01 (×3): 1 via NASAL
  Filled 2014-10-30: qty 16

## 2014-10-30 MED ORDER — INSULIN ASPART 100 UNIT/ML ~~LOC~~ SOLN
0.0000 [IU] | Freq: Three times a day (TID) | SUBCUTANEOUS | Status: DC
  Administered 2014-10-31: 8 [IU] via SUBCUTANEOUS
  Administered 2014-10-31: 11 [IU] via SUBCUTANEOUS
  Administered 2014-10-31: 15 [IU] via SUBCUTANEOUS
  Administered 2014-11-01 (×2): 11 [IU] via SUBCUTANEOUS

## 2014-10-30 MED ORDER — METHYLPREDNISOLONE SODIUM SUCC 125 MG IJ SOLR
125.0000 mg | Freq: Once | INTRAMUSCULAR | Status: AC
  Administered 2014-10-30: 125 mg via INTRAVENOUS
  Filled 2014-10-30: qty 2

## 2014-10-30 MED ORDER — INSULIN GLARGINE 100 UNIT/ML ~~LOC~~ SOLN
30.0000 [IU] | Freq: Every day | SUBCUTANEOUS | Status: DC
  Administered 2014-10-30: 30 [IU] via SUBCUTANEOUS
  Filled 2014-10-30 (×4): qty 0.3

## 2014-10-30 MED ORDER — PANTOPRAZOLE SODIUM 40 MG PO TBEC
40.0000 mg | DELAYED_RELEASE_TABLET | Freq: Two times a day (BID) | ORAL | Status: DC
  Administered 2014-10-30 – 2014-11-01 (×5): 40 mg via ORAL
  Filled 2014-10-30 (×5): qty 1

## 2014-10-30 NOTE — Consult Note (Signed)
Name: Derek Anthony MRN: [id number] DOB: [birth year]    ADMISSION DATE:  10/26/2014 CONSULTATION DATE:  10/30/14  REFERRING MD :  Wynelle Cleveland   CHIEF COMPLAINT:  Cough, Fever   BRIEF PATIENT DESCRIPTION: 59yo male former smoker (20 pack years, quit 1994) with hx DM, arthritis, HTN, GERD, sickle cell trait with 4-5 week hx blisters on hands, fever, arthralgias to small joints (hand/wrist), weight loss of 25 lbs over 6-8 weeks, fatigue, and productive morning cough.  No improvement with outpt levaquin, and multiple courses prednisone. Outpt workup ongoing.  He was ultimately admitted 3/5 for more extensive inpatient w/u for fever.  He has been followed by ID and skin bx performed 3/8 (pending).  PCCM consulted 3/9 given persistent cough and ?relevance.   SIGNIFICANT EVENTS    STUDIES:  CT chest 3/9>>> neg acute.  Nonspecific minimal peripheral haziness with ring of peripheral sparing.  Scattered R>L Upper lobe blebs.  Skin bx 3/8>>>  Autoimmune/ rheum:  CRP =4.1 ESR =60 B burg= neg  Hepatitis panel>> NEG RF= neg  HIV = NR  RPR = neg  RMSF>>>neg  ANCA = neg  Quant gold>>> From outpt office - ANA = POS  From outpt office - Sjogrens SSA = 3.8 (0-0.9)  HLA-B27>>> S cerevisiae>>>  HISTORY OF PRESENT ILLNESS:  59yo male former smoker with hx DM, arthritis, HTN, GERD, sickle cell trait with 4-5 week hx blisters on hands, fever, arthralgias to small joints (hand/wrist), weight loss of 25 lbs over 6-8 weeks, fatigue, and productive morning cough.  No improvement with outpt levaquin, and multiple courses prednisone. Outpt workup ongoing.  He was ultimately admitted 3/5 for more extensive inpatient w/u for fever.  He has been followed by ID and skin bx performed 3/8 (pending).  PCCM consulted 3/9 given persistent cough.   Per pt, the cough/congestion seems to have been the first symptom, but is not unusual for him as he has significant allergies and chronic congestion.  C/o continued sore throat.   Cough is ongoing, mostly in the morning and productive of yellow sputum.  Has occasionally been blood tinged but no frank hemoptysis. No recent travel or sick contacts.  No known TB exposure.  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   PAST MEDICAL HISTORY :   has a past medical history of Diabetes mellitus without complication; Hypertension; GERD (gastroesophageal reflux disease); Anemia; Sickle cell trait; and Gout.  has past surgical history that includes Shoulder surgery; Knee reconstruction (Right); and Vasectomy. Prior to Admission medications   Medication Sig Start Date End Date Taking? Authorizing Provider  allopurinol (ZYLOPRIM) 300 MG tablet Take 300 mg by mouth daily.   Yes Historical Provider, MD  aspirin EC 81 MG tablet Take 81 mg by mouth daily.   Yes Historical Provider, MD  cetirizine (ZYRTEC) 10 MG tablet Take 10 mg by mouth every morning.    Yes Historical Provider, MD  fluticasone (FLONASE) 50 MCG/ACT nasal spray Place 2 sprays into both nostrils daily.   Yes Historical Provider, MD  gabapentin (NEURONTIN) 600 MG tablet Take 600 mg by mouth 2 (two) times daily.   Yes Historical Provider, MD  insulin aspart protamine- aspart (NOVOLOG MIX 70/30) (70-30) 100 UNIT/ML injection Inject 20-30 Units into the skin 2 (two) times daily. 30 units in the morning and 20 units in the evening   Yes Historical Provider, MD  insulin glargine (LANTUS) 100 UNIT/ML injection Inject 25 Units into the skin  at bedtime.   Yes Historical Provider, MD  levofloxacin (LEVAQUIN) 500 MG tablet Take 500 mg by mouth daily. Started 10/20/14, for 7 days ending 10/26/14 10/20/14  Yes Historical Provider, MD  montelukast (SINGULAIR) 10 MG tablet Take 10 mg by mouth at bedtime.   Yes Historical Provider, MD  olmesartan (BENICAR) 40 MG tablet Take 40 mg by mouth daily.   Yes Historical Provider, MD  olmesartan (BENICAR) 40 MG tablet Take 40 mg by mouth daily.   Yes Historical Provider,  MD  predniSONE (DELTASONE) 5 MG tablet Take 5 mg by mouth daily. 10/18/14  Yes Historical Provider, MD  traMADol (ULTRAM) 50 MG tablet Take 100 mg by mouth at bedtime.    Yes Historical Provider, MD   Allergies  Allergen Reactions  . Sulfa Antibiotics Itching and Rash    Burning also  . Penicillins Itching and Rash    FAMILY HISTORY:  family history includes Diabetes in his mother; Hypertension in his father and mother. SOCIAL HISTORY:  reports that he quit smoking about 22 years ago. His smoking use included Cigarettes. He has a 20 pack-year smoking history. He does not have any smokeless tobacco history on file. He reports that he does not drink alcohol or use illicit drugs.  REVIEW OF SYSTEMS:   As per HPI - All other systems reviewed and were neg.    SUBJECTIVE:   VITAL SIGNS: Temp:  [99.5 F (37.5 C)-100 F (37.8 C)] 99.7 F (37.6 C) (03/09 0600) Pulse Rate:  [79-82] 79 (03/09 0600) Resp:  [16-18] 16 (03/09 0600) BP: (123-126)/(72-80) 125/72 mmHg (03/09 0600) SpO2:  [89 %-97 %] 90 % (03/09 0600) Weight:  [233 lb 6.4 oz (105.87 kg)] 233 lb 6.4 oz (105.87 kg) (03/09 0605)  PHYSICAL EXAMINATION: General:  Very pleasant, wdwn male, NAD  Neuro:  Awake, alert, appropriate, MAE  HEENT:  Mm moist, no JVD  Cardiovascular:  s1s2 rrr Lungs:  resps even non labored on RA, CTA  Abdomen:  Soft, +bs  Musculoskeletal:  Warm and dry, no edema, healed macular rash palms, +tenderness, no other visible rash    Recent Labs Lab 10/26/14 1408 10/28/14 0658  NA 142 138  K 4.1 3.9  CL 106 108  CO2 32 27  BUN 10 9  CREATININE 1.30 1.01  GLUCOSE 162* 153*    Recent Labs Lab 10/26/14 1408 10/28/14 0658  HGB 12.9* 10.9*  HCT 39.5 33.0*  WBC 7.3 5.9  PLT 281 189   No results found.  ASSESSMENT / PLAN:  Cough  Fever uknown origin  Rash   PLAN -  Thorough and ongoing w/u by [facility] and ID  F/u quant gold results  Check A1 antitrypsin Sputum culture and AFB BID PPI for  GERD  F/u skin bx  Resume home flonase  Trend wbc, fever curve  Agree with hold off on abx Cont steroids for now  [facility] to d/w rheum   Nickolas Madrid, NP 10/30/2014  10:35 AM Pager: [phone] or [phone]  This appears mostly rheumatologic, the CT does not appear diagnostic, continue abx and steroids.  Patient seen and examined, agree with above note.  I dictated the care and orders written for this patient under my direction.  Rush Farmer, MD [phone]

## 2014-10-30 NOTE — Progress Notes (Signed)
Patient ID: Derek Anthony, male   DOB: October 16, 1955, 59 y.o.   MRN: [id number]     [facility]      [address]   [place], [place] [address]    Phone: [phone] FAX: [phone]     Subjective: C/o shoulder Anthony.   Objective:  Vital signs:  Filed Vitals:   10/29/14 1354 10/29/14 2241 10/30/14 0600 10/30/14 0605  BP: 126/80 123/77 125/72   Pulse: 82 80 79   Temp: 99.5 F (37.5 C) 100 F (37.8 C) 99.7 F (37.6 C)   TempSrc: Oral Oral Oral   Resp: Height:      Weight:    233 lb 6.4 oz (105.87 kg)  SpO2: 97% 89% 90%     Last BM Date: 10/30/14  Intake/Output   Yesterday:  03/08 0701 - 03/09 0700 In: 880 [P.O.:880] Out: 900 [Urine:900] This shift:  Total I/O In: 240 [P.O.:240] Out: 200 [Urine:200]    Physical Exam: General: Pt awake/alert/oriented x4 in no acute distress  Skin:left hand-wound looks good, suture in place, no bleeding or erythema.    Problem List:   Active Problems:   Rash   Anemia   Fever of undetermined origin   Cough   Benign essential HTN   DM type 2, goal A1c below 7    Results:   Labs: Results for orders placed or performed during the hospital encounter of 10/26/14 (from the past 48 hour(s))  Glucose, capillary     Status: Abnormal   Collection Time: 10/28/14 11:56 AM  Result Value Ref Range   Glucose-Capillary 164 (H) 70 - 99 mg/dL  Glucose, capillary     Status: Abnormal   Collection Time: 10/28/14  5:46 PM  Result Value Ref Range   Glucose-Capillary 148 (H) 70 - 99 mg/dL  Glucose, capillary     Status: Abnormal   Collection Time: 10/28/14  9:44 PM  Result Value Ref Range   Glucose-Capillary 162 (H) 70 - 99 mg/dL  Glucose, capillary     Status: Abnormal   Collection Time: 10/29/14  8:09 AM  Result Value Ref Range   Glucose-Capillary 107 (H) 70 - 99 mg/dL  Glucose, capillary     Status: Abnormal   Collection Time: 10/29/14 12:03 PM  Result Value Ref Range   Glucose-Capillary 149 (H) 70 - 99 mg/dL  Glucose, capillary     Status: Abnormal   Collection Time: 10/29/14  5:13 PM  Result Value Ref Range   Glucose-Capillary 215 (H) 70 - 99 mg/dL  Glucose, capillary     Status: Abnormal   Collection Time: 10/29/14 10:28 PM  Result Value Ref Range   Glucose-Capillary 116 (H) 70 - 99 mg/dL  Glucose, capillary     Status: None   Collection Time: 10/30/14  7:47 AM  Result Value Ref Range   Glucose-Capillary 93 70 - 99 mg/dL    Imaging / Studies: No results found.  Medications / Allergies:  Scheduled Meds: . allopurinol  300 mg Oral Daily  . aspirin EC  81 mg Oral Daily  . feeding supplement (GLUCERNA SHAKE)  237 mL Oral TID BM  . gabapentin  600 mg Oral BID  . insulin aspart  0-5 Units Subcutaneous QHS  . insulin aspart  0-9 Units Subcutaneous TID WC  . insulin glargine  25 Units Subcutaneous QHS  . irbesartan  300 mg Oral Daily  . loratadine  10 mg Oral Daily  .  methylPREDNISolone (SOLU-MEDROL) injection  125 mg Intravenous Once  . montelukast  10 mg Oral QHS  . [START ON 10/31/2014] predniSONE  60 mg Oral Q breakfast  . sodium chloride  3 mL Intravenous Q12H  . traMADol  100 mg Oral QHS   Continuous Infusions:  PRN Meds:.HYDROmorphone (DILAUDID) injection, menthol-cetylpyridinium, phenol  Antibiotics: Anti-infectives    Start     Dose/Rate Route Frequency Ordered Stop   10/27/14 2200  levofloxacin (LEVAQUIN) IVPB 750 mg  Status:  Discontinued     750 mg 100 mL/hr over 90 Minutes Intravenous Every 24 hours 10/27/14 1242 10/28/14 0900   10/27/14 0800  vancomycin (VANCOCIN) IVPB 1000 mg/200 mL premix  Status:  Discontinued     1,000 mg 200 mL/hr over 60 Minutes Intravenous Every 8 hours 10/27/14 0059 10/28/14 1517   10/27/14 0130  vancomycin (VANCOCIN) 1,500 mg in sodium chloride 0.9 % 500 mL IVPB     1,500 mg 250 mL/hr over 120 Minutes Intravenous  Once 10/27/14 0059 10/27/14 0421   10/27/14 0052  levofloxacin (LEVAQUIN) IVPB 500 mg   Status:  Discontinued     500 mg 100 mL/hr over 60 Minutes Intravenous Every 24 hours 10/27/14 0052 10/27/14 1242        Assessment/Plan POD#1 punch biopsy -remove suture in 6 days -pathology follow up per primary team -surgery signing off, please call [facility] with questions or concerns.    Ashok NorrisEmina Yassin Scales, [facility]NP-BC [facility] Pager [phone](7A-4:30P) For consults and floor pages call [phone](7A-4:30P)  10/30/2014  10:21 AM

## 2014-10-30 NOTE — Progress Notes (Signed)
Derek Anthony:[id number] DOB: [birth year] DOA: 10/26/2014 PCP: No primary care provider on file.  Assessment/Plan: 59 y/o male with PMH of IDDM, HTN, GERD, Gout presented with progressively worsening of blisters associated with fever, chills, generalized pains, weakness, tiredness, loss of appetite for 4 weeks. He reports the rash began 4 weeks ago. He has been Dr. Eliot Ford (an allergist) has been testing for the rash, and received oral prednisone; Patient noted to be febrile in ED. ER physician d/w Dr. Linus Salmons who recommended to admit to r/o infectious process.  Subjective:  No change in complaints   Assessment/ Plan:   Fever, hand rash, constitutional symptoms, -  no significant leukocytosis,  -patient started on IV Levaquin and Vanc- blood cultures neg- stopped Levaquin and Vanc 3/7 as no improvement in symptoms from it - is on Prednisone 5 mg daily started by allergist- stopped this a well on 3/7 as it did not seem to be helping - continues to have a fevers-  Rash is now resolving   - CRP elevated at 4.1, ESR 60 - RF- neg - B burg neg - HIV neg - RPR neg -  RMSF titers, ANA, ANCA CCP, ECHO pend  - have formally asked for ID consult- Dr Baxter Flattery has low suspicious of infectious etiology - Sx performed skin biopsy  - I spoke with Dr Neldon Mc - he has faxed over lab results obtained in his office which I have reviewed with Dr Baxter Flattery- ANA is positive but no titer's reported Sjogrens SSA is 3.8 (0- 0.9) and ESR and CPK are elevated as they are here -  discussed with Dr Charlestine Night (rheum) - reviewed lab results with him- he does not feel this is an autoimmune illness - noted to have swelling in left hand today which was not present previously- he c/o pain in b/l shoulder which are tender on exam today- will treat for gout with high dose steroids and follow for improvement in symptoms- this may all be diffuse gout  Cough with yellow sputum and sore throat - have  asked pulm for their opinion   IDDM - cont insulin regimen, HA1c 8.5 - sugars stable here- will likely rise with steroids - will increase insulin today   HTN, stable - cont ARB    Code Status: full Family Communication: d/w patient Disposition Plan: home pend clinical improvement  DVT prophylaxis: SCDs   Consultants:  ID  surgery  Procedures:  ECHO: 10/28/14 Left ventricle: The cavity size was normal. There was moderate concentric hypertrophy. Systolic function was normal. The estimated ejection fraction was in the range of 50% to 55%. Wall motion was normal; there were no regional wall motion abnormalities. Doppler parameters are consistent with abnormal left ventricular relaxation (grade 1 diastolic dysfunction). There was no evidence of elevated ventricular filling pressure by Doppler parameters. - Aortic valve: Trileaflet; mildly thickened leaflets. There was trivial regurgitation. - Aortic root: The aortic root was normal in size. - Mitral valve: Mildly thickened leaflets . - Left atrium: The atrium was mildly dilated. - Right ventricle: Systolic function was normal. - Right atrium: The atrium was normal in size. - Tricuspid valve: There was mild regurgitation. - Pulmonic valve: Structurally normal valve. There was no regurgitation. - Pulmonary arteries: Systolic pressure was within the normal range. - Inferior vena cava: The vessel was normal in size. - Pericardium, extracardiac: There was no pericardial effusion.  Antibiotics:  levofloxacin IV 3/5>> 3/7  vanc 3/5>>>> 3/7    Objective:  Filed Vitals:   10/30/14 0600  BP: 125/72  Pulse: 79  Temp: 99.7 F (37.6 C)  Resp: 16    Intake/Output Summary (Last 24 hours) at 10/30/14 1042 Last data filed at 10/30/14 7341  Gross per 24 hour  Intake   1120 ml  Output    800 ml  Net    320 ml   Filed Weights   10/28/14 0426 10/29/14 0504 10/30/14 0605  Weight: 103 kg (227 lb 1.2 oz)  102.8 kg (226 lb 10.1 oz) 105.87 kg (233 lb 6.4 oz)    Exam:   General:  alert  Cardiovascular: s1,s2 rrr  Respiratory: CTA BL  Abdomen: soft, nt,nd   Musculoskeletal: swelling in left hand and wrist  Skin:  rash in various stages on palms and elbows- no more vesicles- blemishes from old rash noted   Data Reviewed: Basic Metabolic Panel:  Recent Labs Lab 10/26/14 1408 10/28/14 0658  NA 142 138  K 4.1 3.9  CL 106 108  CO2 32 27  GLUCOSE 162* 153*  BUN 10 9  CREATININE 1.30 1.01  CALCIUM 8.8 7.7*   Liver Function Tests:  Recent Labs Lab 10/26/14 1408  AST 35  ALT 24  ALKPHOS 56  BILITOT 1.0  PROT 6.8  ALBUMIN 2.9*   No results for input(s): LIPASE, AMYLASE in the last 168 hours. No results for input(s): AMMONIA in the last 168 hours. CBC:  Recent Labs Lab 10/26/14 1408 10/28/14 0658  WBC 7.3 5.9  NEUTROABS 5.8  --   HGB 12.9* 10.9*  HCT 39.5 33.0*  MCV 79.2 79.5  PLT 281 189   Cardiac Enzymes:  Recent Labs Lab 10/26/14 2250  CKTOTAL 227   BNP (last 3 results) No results for input(s): BNP in the last 8760 hours.  ProBNP (last 3 results) No results for input(s): PROBNP in the last 8760 hours.  CBG:  Recent Labs Lab 10/29/14 0809 10/29/14 1203 10/29/14 1713 10/29/14 2228 10/30/14 0747  GLUCAP 107* 149* 215* 116* 93    Recent Results (from the past 240 hour(s))  Blood culture (routine x 2)     Status: None (Preliminary result)   Collection Time: 10/26/14 10:48 PM  Result Value Ref Range Status   Specimen Description BLOOD RIGHT HAND  Final   Special Requests BOTTLES DRAWN AEROBIC AND ANAEROBIC 4CC EACH  Final   Culture   Final           BLOOD CULTURE RECEIVED NO GROWTH TO DATE CULTURE WILL BE HELD FOR 5 DAYS BEFORE ISSUING A FINAL NEGATIVE REPORT Performed at [facility]    Report Status PENDING  Incomplete  Blood culture (routine x 2)     Status: None (Preliminary result)   Collection Time: 10/26/14 10:53 PM   Result Value Ref Range Status   Specimen Description BLOOD LEFT HAND  Final   Special Requests BOTTLES DRAWN AEROBIC AND ANAEROBIC 5CC EACH  Final   Culture   Final           BLOOD CULTURE RECEIVED NO GROWTH TO DATE CULTURE WILL BE HELD FOR 5 DAYS BEFORE ISSUING A FINAL NEGATIVE REPORT Performed at [facility]    Report Status PENDING  Incomplete     Studies: No results found.  Scheduled Meds: . allopurinol  300 mg Oral Daily  . aspirin EC  81 mg Oral Daily  . feeding supplement (GLUCERNA SHAKE)  237 mL Oral TID BM  . gabapentin  600 mg Oral BID  .  insulin aspart  0-5 Units Subcutaneous QHS  . insulin aspart  0-9 Units Subcutaneous TID WC  . insulin glargine  25 Units Subcutaneous QHS  . irbesartan  300 mg Oral Daily  . loratadine  10 mg Oral Daily  . montelukast  10 mg Oral QHS  . [START ON 10/31/2014] predniSONE  60 mg Oral Q breakfast  . sodium chloride  3 mL Intravenous Q12H  . traMADol  100 mg Oral QHS   Continuous Infusions:   Time spent: >35 minutes     [place] [facility], [email or web address], password [place] 10/30/2014, 10:42 AM  LOS: 4 days

## 2014-10-30 NOTE — Progress Notes (Addendum)
[facility]    Date of Admission:  10/26/2014   Total days of antibiotics 3        abtx stopped on 3/7   ID: Derek Anthony is a 59 y.o. male with  5-6 wk hx of febrile rash and arthralgia not responding to antibiotics and low dose steroids  Active Problems:   Rash   Anemia   Fever of undetermined origin   Cough   Benign essential HTN   DM type 2, goal A1c below 7    Subjective: Has been off of antibiotics for nearly 48hrs, now fevers during this time, tmax at 100.1 over the last 48hrs.  Still has sore throat, denies dysphagia. Still stiffness to hands  Medications:  . allopurinol  300 mg Oral Daily  . aspirin EC  81 mg Oral Daily  . feeding supplement (GLUCERNA SHAKE)  237 mL Oral TID BM  . fluticasone  1 spray Each Nare Daily  . gabapentin  600 mg Oral BID  . insulin aspart  0-5 Units Subcutaneous QHS  . insulin aspart  0-9 Units Subcutaneous TID WC  . insulin glargine  25 Units Subcutaneous QHS  . irbesartan  300 mg Oral Daily  . loratadine  10 mg Oral Daily  . montelukast  10 mg Oral QHS  . pantoprazole  40 mg Oral BID  . [START ON 10/31/2014] predniSONE  60 mg Oral Q breakfast  . sodium chloride  3 mL Intravenous Q12H  . traMADol  100 mg Oral QHS    Objective: Vital signs in last 24 hours: Temp:  [98.7 F (37.1 C)-100 F (37.8 C)] 98.7 F (37.1 C) (03/09 1444) Pulse Rate:  [79-80] 79 (03/09 0600) Resp:  [16-18] 16 (03/09 1444) BP: (123-139)/(72-77) 139/75 mmHg (03/09 1444) SpO2:  [89 %-91 %] 91 % (03/09 1444) Weight:  [233 lb 6.4 oz (105.87 kg)] 233 lb 6.4 oz (105.87 kg) (03/09 1610) Physical Exam  Constitutional: He is oriented to person, place, and time. He appears well-developed and well-nourished. No distress.  HENT:  Mouth/Throat: Oropharynx is clear and moist. No oropharyngeal exudate.  Cardiovascular: Normal rate, regular rhythm and normal heart sounds. Exam reveals no gallop and no friction rub.  No murmur heard.    Pulmonary/Chest: Effort normal and breath sounds normal. No respiratory distress. He has no wheezes.  Abdominal: Soft. Bowel sounds are normal. He exhibits no distension. There is no tenderness.  Lymphadenopathy:  He has no cervical adenopathy.  Neurological: He is alert and oriented to person, place, and time.  Skin: Skin is warm and dry. No rash noted. No erythema.  Psychiatric: He has a normal mood and affect. His behavior is normal.      Lab Results  Recent Labs  10/28/14 0658 10/30/14 1212  WBC 5.9 8.9  HGB 10.9* 11.1*  HCT 33.0* 34.5*  NA 138  --   K 3.9  --   CL 108  --   CO2 27  --   BUN 9  --   CREATININE 1.01  --    Lab Results  Component Value Date   ESRSEDRATE 60* 10/26/2014   Lab Results  Component Value Date   CRP 4.1* 10/26/2014    Microbiology: 3/5 blood cx ngtd  Studies/Results:  2 d echo: Left ventricle: The cavity size was normal. There was moderate concentric hypertrophy. Systolic function was normal. The estimated ejection fraction was in the range of 50% to 55%. Wall motion was normal;  there were no regional wall motion abnormalities. Doppler parameters are consistent with abnormal left ventricular relaxation (grade 1 diastolic dysfunction). There was no evidence of elevated ventricular filling pressure by Doppler parameters. - Aortic valve: Trileaflet; mildly thickened leaflets. There was trivial regurgitation. - Aortic root: The aortic root was normal in size. - Mitral valve: Mildly thickened leaflets . - Left atrium: The atrium was mildly dilated. - Right ventricle: Systolic function was normal. - Right atrium: The atrium was normal in size. - Tricuspid valve: There was mild regurgitation. - Pulmonic valve: Structurally normal valve. There was no regurgitation. - Pulmonary arteries: Systolic pressure was within the normal range. - Inferior vena cava: The vessel was normal in size. - Pericardium, extracardiac:  There was no pericardial effusion.  Assessment/Plan: Fever of unknown origin = will continue to monitor fever curve without antibiotics. Will repeat blood cx today.   Dry throat = unclear if he maybe having esophageal candidiasis from repeat abtx courses prior to coming into the hospital. He is trying chloraseptic presently to see if helps  Febrile rash with arthralgia = definitely has inflammation as sedrate and crp are elevated. He is negative for other markers such as Rheumatoid factor, ana, anca, ccp, making RA, SLE less likely. Not having high fevers as you would see with adult stills. Will check ferritin. quantiferon pending. Serum protein electropheresis pending.   Rash does have appearance of immune complex deposition that you can see with endocarditis, however, he has not had positive blood culture. Recommend to get TEE.  He is getting started on steroids for diffuse gout/localized gout of left wrist. which could explain arthralgia but not necessarily his concurrent rash  Drue SecondSNIDER, [facility] Cell: [phone] Pager: [phone]  10/30/2014, 4:25 PM

## 2014-10-30 NOTE — Progress Notes (Signed)
Inpatient Diabetes Program Recommendations  AACE/ADA: New Consensus Statement on Inpatient Glycemic Control (2013)  Target Ranges:  Prepandial:   less than 140 mg/dL      Peak postprandial:   less than 180 mg/dL (1-2 hours)      Critically ill patients:  140 - 180 mg/dL     Thank you Kawan Valladolid, RN, MSN, CDE  Diabetes Inpatient Program Office: [phone] Pager: [phone] 8:00 am to 5:00 pm     

## 2014-10-31 ENCOUNTER — Inpatient Hospital Stay (HOSPITAL_COMMUNITY): Payer: 59

## 2014-10-31 DIAGNOSIS — M2550 Pain in unspecified joint: Secondary | ICD-10-CM

## 2014-10-31 LAB — PULMONARY FUNCTION TEST
DL/VA % pred: 91 %
DL/VA: 4.4 ml/min/mmHg/L
DLCO UNC % PRED: 67 %
DLCO cor % pred: 76 %
DLCO cor: 27.89 ml/min/mmHg
DLCO unc: 24.68 ml/min/mmHg
FEF 25-75 POST: 4.49 L/s
FEF 25-75 Pre: 2.7 L/sec
FEF2575-%CHANGE-POST: 66 %
FEF2575-%PRED-POST: 141 %
FEF2575-%Pred-Pre: 85 %
FEV1-%CHANGE-POST: 14 %
FEV1-%PRED-POST: 110 %
FEV1-%Pred-Pre: 96 %
FEV1-POST: 3.82 L
FEV1-PRE: 3.34 L
FEV1FVC-%Change-Post: 5 %
FEV1FVC-%PRED-PRE: 97 %
FEV6-%CHANGE-POST: 8 %
FEV6-%PRED-POST: 109 %
FEV6-%PRED-PRE: 101 %
FEV6-POST: 4.72 L
FEV6-Pre: 4.35 L
FEV6FVC-%CHANGE-POST: 0 %
FEV6FVC-%Pred-Post: 103 %
FEV6FVC-%Pred-Pre: 102 %
FVC-%CHANGE-POST: 8 %
FVC-%PRED-POST: 106 %
FVC-%PRED-PRE: 98 %
FVC-POST: 4.76 L
FVC-Pre: 4.41 L
PRE FEV1/FVC RATIO: 76 %
Post FEV1/FVC ratio: 80 %
Post FEV6/FVC ratio: 99 %
Pre FEV6/FVC Ratio: 99 %
RV % PRED: 97 %
RV: 2.37 L
TLC % PRED: 93 %
TLC: 7.08 L

## 2014-10-31 LAB — FERRITIN: Ferritin: 569 ng/mL — ABNORMAL HIGH (ref 22–322)

## 2014-10-31 LAB — GLUCOSE, CAPILLARY
GLUCOSE-CAPILLARY: 314 mg/dL — AB (ref 70–99)
Glucose-Capillary: 278 mg/dL — ABNORMAL HIGH (ref 70–99)
Glucose-Capillary: 383 mg/dL — ABNORMAL HIGH (ref 70–99)
Glucose-Capillary: 321 mg/dL — ABNORMAL HIGH (ref 70–99)

## 2014-10-31 LAB — C-REACTIVE PROTEIN: CRP: 7.8 mg/dL — ABNORMAL HIGH (ref <0.60)

## 2014-10-31 LAB — SEDIMENTATION RATE: SED RATE: 61 mm/h — AB (ref 0–16)

## 2014-10-31 LAB — CYCLIC CITRUL PEPTIDE ANTIBODY, IGG/IGA: CCP ANTIBODIES IGG/IGA: 4 U (ref 0–19)

## 2014-10-31 MED ORDER — ALBUTEROL SULFATE (2.5 MG/3ML) 0.083% IN NEBU
2.5000 mg | INHALATION_SOLUTION | Freq: Once | RESPIRATORY_TRACT | Status: AC
  Administered 2014-10-31: 2.5 mg via RESPIRATORY_TRACT

## 2014-10-31 MED ORDER — PREDNISONE 20 MG PO TABS
40.0000 mg | ORAL_TABLET | Freq: Every day | ORAL | Status: DC
  Administered 2014-11-01: 40 mg via ORAL
  Filled 2014-10-31 (×2): qty 2

## 2014-10-31 MED ORDER — INSULIN GLARGINE 100 UNIT/ML ~~LOC~~ SOLN
35.0000 [IU] | Freq: Every day | SUBCUTANEOUS | Status: DC
  Administered 2014-10-31: 35 [IU] via SUBCUTANEOUS
  Filled 2014-10-31 (×2): qty 0.35

## 2014-10-31 NOTE — Consult Note (Deleted)
   Name: Derek Anthony MRN: [id number] DOB: 10/09/1955    ADMISSION DATE:  10/26/2014 CONSULTATION DATE:  10/30/14  REFERRING MD :  Wynelle Cleveland   CHIEF COMPLAINT:  Cough, Fever   BRIEF PATIENT DESCRIPTION: 59 y/o male former smoker (20 pack years, quit 1994) with hx DM, arthritis, HTN, GERD, sickle cell trait with 4-5 week hx blisters on hands, fever, arthralgias to small joints (hand/wrist), weight loss of 25 lbs over 6-8 weeks, fatigue, and productive morning cough.  No improvement with outpt levaquin, and multiple courses prednisone. Outpt workup ongoing.  He was ultimately admitted 3/5 for more extensive inpatient w/u for fever.  He has been followed by ID and skin bx performed 3/8 (pending).  PCCM consulted 3/9 given persistent cough and ?relevance.   SIGNIFICANT EVENTS    STUDIES:  CT chest 3/9 >> neg acute.  Nonspecific minimal peripheral haziness with ring of peripheral sparing.  Scattered R>L Upper lobe blebs.  Skin bx 3/8 >>  Autoimmune/ Rheum:  CRP =4.1 ESR =60 B burg= neg  Hepatitis panel>> NEG RF= neg  HIV = NR  RPR = neg  RMSF>>>neg  ANCA = neg  Quant gold>>> not done From outpt office - ANA = POS  From outpt office - Sjogrens SSA = 3.8 (0-0.9)  HLA-B27>>> neg S cerevisiae>>> neg    SUBJECTIVE: Pt reports feeling wiped out from PFT's.  Notes cough is predominantly in am.  Hx seasonal allergies / sinus issues.  Reports he feels some better than when he was first admitted.  VITAL SIGNS: Temp:  [98.3 F (36.8 C)-98.7 F (37.1 C)] 98.3 F (36.8 C) (03/10 0522) Pulse Rate:  [67-94] 67 (03/10 0522) Resp:  [15-16] 16 (03/10 0522) BP: (130-147)/(75-79) 130/75 mmHg (03/10 0522) SpO2:  [90 %-97 %] 97 % (03/10 0522) Weight:  [227 lb 1.2 oz (103 kg)] 227 lb 1.2 oz (103 kg) (03/10 0705)  PHYSICAL EXAMINATION: General:  Very pleasant, wdwn male, NAD  Neuro:  Awake, alert, appropriate, MAE  HEENT:  Mm moist, no JVD  Cardiovascular:  s1s2 rrr Lungs:  resps even non  labored on RA, CTA  Abdomen:  Soft, +bs  Musculoskeletal:  Warm and dry, no edema, healed macular rash palms, +tenderness, no other visible rash    Recent Labs Lab 10/26/14 1408 10/28/14 0658  NA 142 138  K 4.1 3.9  CL 106 108  CO2 32 27  BUN 10 9  CREATININE 1.30 1.01  GLUCOSE 162* 153*    Recent Labs Lab 10/26/14 1408 10/28/14 0658 10/30/14 1212  HGB 12.9* 10.9* 11.1*  HCT 39.5 33.0* 34.5*  WBC 7.3 5.9 8.9  PLT 281 189 224   No results found.  ASSESSMENT / PLAN:  Cough - feel this is likely related to chronic sinus issues, occurs in am upon waking and resolves with clearance of drainage Fever of uknown origin  Rash   PLAN -  Thorough and ongoing w/u by [facility] and ID  Sputum culture and AFB >> BID PPI for GERD  F/u skin bx >> Continue home flonase, singulair, claritin   Trend wbc, fever curve  Agree with hold off on abx Cont steroids for now  [facility] to d/w rheum  QG not not done by lab due to "age of specimen", consider repeat      Noe Gens, NP-C [place] Pgr: [phone] or [phone]   10/31/2014  11:05 AM

## 2014-10-31 NOTE — Progress Notes (Signed)
 Name: Derek Anthony MRN: [id number] DOB: 07/28/1956    ADMISSION DATE:  10/26/2014 CONSULTATION DATE:  10/30/14  REFERRING MD :  Rizwan   CHIEF COMPLAINT:  Cough, Fever   BRIEF PATIENT DESCRIPTION: 58 y/o male former smoker (20 pack years, quit 1994) with hx DM, arthritis, HTN, GERD, sickle cell trait with 4-5 week hx blisters on hands, fever, arthralgias to small joints (hand/wrist), weight loss of 25 lbs over 6-8 weeks, fatigue, and productive morning cough.  No improvement with outpt levaquin, and multiple courses prednisone. Outpt workup ongoing.  He was ultimately admitted 3/5 for more extensive inpatient w/u for fever.  He has been followed by ID and skin bx performed 3/8 (pending).  PCCM consulted 3/9 given persistent cough and ?relevance.   SIGNIFICANT EVENTS    STUDIES:  CT chest 3/9 >> neg acute.  Nonspecific minimal peripheral haziness with ring of peripheral sparing.  Scattered R>L Upper lobe blebs.  Skin bx 3/8 >>  Autoimmune/ Rheum:  CRP =4.1 ESR =60 B burg= neg  Hepatitis panel>> NEG RF= neg  HIV = NR  RPR = neg  RMSF>>>neg  ANCA = neg  Quant gold>>> not done From outpt office - ANA = POS  From outpt office - Sjogrens SSA = 3.8 (0-0.9)  HLA-B27>>> neg S cerevisiae>>> neg    SUBJECTIVE: Pt reports feeling wiped out from PFT's.  Notes cough is predominantly in am.  Hx seasonal allergies / sinus issues.  Reports he feels some better than when he was first admitted.  VITAL SIGNS: Temp:  [98.3 F (36.8 C)-98.7 F (37.1 C)] 98.3 F (36.8 C) (03/10 0522) Pulse Rate:  [67-94] 67 (03/10 0522) Resp:  [15-16] 16 (03/10 0522) BP: (130-147)/(75-79) 130/75 mmHg (03/10 0522) SpO2:  [90 %-97 %] 97 % (03/10 0522) Weight:  [227 lb 1.2 oz (103 kg)] 227 lb 1.2 oz (103 kg) (03/10 0705)  PHYSICAL EXAMINATION: General:  Very pleasant, wdwn male, NAD  Neuro:  Awake, alert, appropriate, MAE  HEENT:  Mm moist, no JVD  Cardiovascular:  s1s2 rrr Lungs:  resps even non  labored on RA, CTA  Abdomen:  Soft, +bs  Musculoskeletal:  Warm and dry, no edema, healed macular rash palms, +tenderness, no other visible rash    Recent Labs Lab 10/26/14 1408 10/28/14 0658  NA 142 138  K 4.1 3.9  CL 106 108  CO2 32 27  BUN 10 9  CREATININE 1.30 1.01  GLUCOSE 162* 153*    Recent Labs Lab 10/26/14 1408 10/28/14 0658 10/30/14 1212  HGB 12.9* 10.9* 11.1*  HCT 39.5 33.0* 34.5*  WBC 7.3 5.9 8.9  PLT 281 189 224   No results found.  ASSESSMENT / PLAN:  Cough - feel this is likely related to chronic sinus issues, occurs in am upon waking and resolves with clearance of drainage Fever of uknown origin  Rash   PLAN -  Thorough and ongoing w/u by [facility] and ID  Sputum culture and AFB >> BID PPI for GERD  F/u skin bx >> Continue home flonase, singulair, claritin   Trend wbc, fever curve  No need for cough suppression at this time Agree with hold off on abx Cont steroids for now  [facility] to d/w rheum  QG not not done by lab due to "age of specimen", consider repeat   Brandi Ollis, NP-C  [facility] Pgr: [phone] or [phone]  Continue work up as above.  Recommend involvement of rheumatology in the case.    F/U on biopsy result.  There is little to offer from a pulmonary standpoint.  CT findings are unimpressive.  Recommend repeat in 4-6 wks to insure clearance.  Continue upper airway treatment as ordered and arrange for f/u with PCCM upon discharge for work up of cough.  PCCM will sign off, please call back if needed.  Patient seen and examined, agree with above note.  I dictated the care and orders written for this patient under my direction.  Rush Farmer, MD [phone]  10/31/2014  11:42 AM

## 2014-10-31 NOTE — Progress Notes (Addendum)
Inpatient Diabetes Program Recommendations  AACE/ADA: New Consensus Statement on Inpatient Glycemic Control (2013)  Target Ranges:  Prepandial:   less than 140 mg/dL      Peak postprandial:   less than 180 mg/dL (1-2 hours)      Critically ill patients:  140 - 180 mg/dL  [ Pt on Prednisone 40 mg daily. Prednisone has most marked effect on glucose levels following meals. Noted increase in lantus to 35 units-meal coverage is of most importance while on any dose of prednisone greater than or equal to 20 mg/day.   Inpatient Diabetes Program Recommendations Insulin - Meal Coverage: While on Prednisone 20-60 mg/day, Please add meal coverage to treatment regimen.- 4 units tidwc  Today fasting is high as well. Please continue to increase the lantus by 5 units until the fastings are controlled as well.  Thank you Lenor CoffinAnn Kenzleigh Sedam, RN, MSN, CDE  Diabetes Inpatient Program Office: [phone] Pager: [phone] 8:00 am to 5:00 pm

## 2014-10-31 NOTE — Progress Notes (Addendum)
Derek Anthony QHU:[id number] DOB: 1955-09-20 DOA: 10/26/2014 PCP: No primary care provider on file.  Assessment/Plan: 59 y/o male with PMH of IDDM, HTN, GERD, Gout presented with progressively worsening of blisters associated with fever, chills, generalized pains, weakness, tiredness, loss of appetite for 4 weeks. He reports the rash began 4 weeks ago. He has been Dr. Neldon Mc (allergist) has had testing for the rash, and received oral prednisone; Patient noted to be febrile in ED. ER physician d/w Dr. Linus Salmons who recommended to admit to r/o infectious process.  Subjective:  No change in complaints  Assessment/ Plan:   Fever (99-100 degrees), hand rash, joint pains -  no significant leukocytosis, 2 sets of blood cultures negative -patient started on IV Levaquin and Vanc on 3/5- stopped Levaquin and Vanc 3/7 as no improvement in symptoms from it - was on Prednisone 5 mg daily started by allergist- stopped this a well on 3/7 as it did not seem to be helping -continued to have a fevers, swelling in left hand and pain in b/l shoulders- started on high dose steroids for possible diffuse gout on 3/9 and fevers have subsequently resolved- will begin to taper  - spoke with dr Dagmar Hait today and updated him- he agrees with the steroid taper and will see patient back in the office within a week - CRP elevated at 4.1, ESR 60- repeating today - RF- neg - B burg neg - HIV neg - RPR neg -  RMSF titers neg  ANA, ANCA CCP negative (perfromed twice) - ECHO unrevealing- see report below - have formally asked for ID consult- Dr Baxter Flattery has low suspicious of infectious etiology - Sx performed skin biopsy  - I spoke with Dr Neldon Mc - he has faxed over lab results obtained in his office which I have reviewed with Dr Baxter Flattery- ANA is positive but no titer's reported Sjogrens SSA is 3.8 (0- 0.9) and ESR and CPK are elevated as they are here -  discussed with Dr Charlestine Night (rheum) - reviewed  lab results with him- he does not feel this is an autoimmune illness   Cough with yellow sputum and sore throat - have asked pulm for their opinion- PFTs done today- AFB and Quant gold recommended   IDDM - cont insulin regimen, HA1c 8.5 - sugars rising with steroids - will increase insulin again today   HTN, stable - cont ARB    Code Status: full Family Communication: d/w patient and wife Disposition Plan: home pend clinical improvement  DVT prophylaxis: SCDs   Consultants:  ID  surgery  Procedures:  ECHO: 10/28/14 Left ventricle: The cavity size was normal. There was moderate concentric hypertrophy. Systolic function was normal. The estimated ejection fraction was in the range of 50% to 55%. Wall motion was normal; there were no regional wall motion abnormalities. Doppler parameters are consistent with abnormal left ventricular relaxation (grade 1 diastolic dysfunction). There was no evidence of elevated ventricular filling pressure by Doppler parameters. - Aortic valve: Trileaflet; mildly thickened leaflets. There was trivial regurgitation. - Aortic root: The aortic root was normal in size. - Mitral valve: Mildly thickened leaflets . - Left atrium: The atrium was mildly dilated. - Right ventricle: Systolic function was normal. - Right atrium: The atrium was normal in size. - Tricuspid valve: There was mild regurgitation. - Pulmonic valve: Structurally normal valve. There was no regurgitation. - Pulmonary arteries: Systolic pressure was within the normal range. - Inferior vena cava: The vessel was normal in  size. - Pericardium, extracardiac: There was no pericardial effusion.  Antibiotics:  levofloxacin IV 3/5>> 3/7  vanc 3/5>>>> 3/7    Objective: Filed Vitals:   10/31/14 0522  BP: 130/75  Pulse: 67  Temp: 98.3 F (36.8 C)  Resp: 16    Intake/Output Summary (Last 24 hours) at 10/31/14 1215 Last data filed at 10/31/14 1110   Gross per 24 hour  Intake    480 ml  Output   1050 ml  Net   -570 ml   Filed Weights   10/29/14 0504 10/30/14 0605 10/31/14 0705  Weight: 102.8 kg (226 lb 10.1 oz) 105.87 kg (233 lb 6.4 oz) 103 kg (227 lb 1.2 oz)    Exam:   General:  alert  Cardiovascular: s1,s2 rrr  Respiratory: CTA BL  Abdomen: soft, nt,nd   Musculoskeletal: swelling in left hand and wrist- improving  Skin:  rash in various stages on palms and elbows- no more vesicles- blemishes from old rash noted   Data Reviewed: Basic Metabolic Panel:  Recent Labs Lab 10/26/14 1408 10/28/14 0658  NA 142 138  K 4.1 3.9  CL 106 108  CO2 32 27  GLUCOSE 162* 153*  BUN 10 9  CREATININE 1.30 1.01  CALCIUM 8.8 7.7*   Liver Function Tests:  Recent Labs Lab 10/26/14 1408  AST 35  ALT 24  ALKPHOS 56  BILITOT 1.0  PROT 6.8  ALBUMIN 2.9*   No results for input(s): LIPASE, AMYLASE in the last 168 hours. No results for input(s): AMMONIA in the last 168 hours. CBC:  Recent Labs Lab 10/26/14 1408 10/28/14 0658 10/30/14 1212  WBC 7.3 5.9 8.9  NEUTROABS 5.8  --  8.0*  HGB 12.9* 10.9* 11.1*  HCT 39.5 33.0* 34.5*  MCV 79.2 79.5 79.7  PLT 281 189 224   Cardiac Enzymes:  Recent Labs Lab 10/26/14 2250  CKTOTAL 227   BNP (last 3 results) No results for input(s): BNP in the last 8760 hours.  ProBNP (last 3 results) No results for input(s): PROBNP in the last 8760 hours.  CBG:  Recent Labs Lab 10/30/14 0747 10/30/14 1235 10/30/14 1719 10/30/14 2122 10/31/14 0918  GLUCAP 93 206* 316* 340* 278*    Recent Results (from the past 240 hour(s))  Blood culture (routine x 2)     Status: None (Preliminary result)   Collection Time: 10/26/14 10:48 PM  Result Value Ref Range Status   Specimen Description BLOOD RIGHT HAND  Final   Special Requests BOTTLES DRAWN AEROBIC AND ANAEROBIC 4CC EACH  Final   Culture   Final           BLOOD CULTURE RECEIVED NO GROWTH TO DATE CULTURE WILL BE HELD FOR 5 DAYS  BEFORE ISSUING A FINAL NEGATIVE REPORT Performed at [facility]    Report Status PENDING  Incomplete  Blood culture (routine x 2)     Status: None (Preliminary result)   Collection Time: 10/26/14 10:53 PM  Result Value Ref Range Status   Specimen Description BLOOD LEFT HAND  Final   Special Requests BOTTLES DRAWN AEROBIC AND ANAEROBIC 5CC EACH  Final   Culture   Final           BLOOD CULTURE RECEIVED NO GROWTH TO DATE CULTURE WILL BE HELD FOR 5 DAYS BEFORE ISSUING A FINAL NEGATIVE REPORT Performed at [facility]    Report Status PENDING  Incomplete  Culture, blood (routine x 2)     Status: None (Preliminary result)  Collection Time: 10/30/14 12:12 PM  Result Value Ref Range Status   Specimen Description BLOOD LEFT ANTECUBITAL  Final   Special Requests BOTTLES DRAWN AEROBIC AND ANAEROBIC 10CC  Final   Culture   Final           BLOOD CULTURE RECEIVED NO GROWTH TO DATE CULTURE WILL BE HELD FOR 5 DAYS BEFORE ISSUING A FINAL NEGATIVE REPORT Performed at [facility]    Report Status PENDING  Incomplete  Culture, blood (routine x 2)     Status: None (Preliminary result)   Collection Time: 10/30/14 12:24 PM  Result Value Ref Range Status   Specimen Description BLOOD RIGHT WRIST  Final   Special Requests BOTTLES DRAWN AEROBIC AND ANAEROBIC 10CC  Final   Culture   Final           BLOOD CULTURE RECEIVED NO GROWTH TO DATE CULTURE WILL BE HELD FOR 5 DAYS BEFORE ISSUING A FINAL NEGATIVE REPORT Performed at [facility]    Report Status PENDING  Incomplete     Studies: No results found.  Scheduled Meds: . allopurinol  300 mg Oral Daily  . aspirin EC  81 mg Oral Daily  . feeding supplement (GLUCERNA SHAKE)  237 mL Oral TID BM  . fluticasone  1 spray Each Nare Daily  . gabapentin  600 mg Oral BID  . insulin aspart  0-15 Units Subcutaneous TID WC  . insulin aspart  0-5 Units Subcutaneous QHS  . insulin glargine  30 Units Subcutaneous QHS  . irbesartan   300 mg Oral Daily  . loratadine  10 mg Oral Daily  . montelukast  10 mg Oral QHS  . pantoprazole  40 mg Oral BID  . predniSONE  60 mg Oral Q breakfast  . sodium chloride  3 mL Intravenous Q12H  . traMADol  100 mg Oral QHS   Continuous Infusions:   Time spent: >35 minutes     [place] [facility], [email or web address], password [facility] 10/31/2014, 12:15 PM  LOS: 5 days

## 2014-11-01 DIAGNOSIS — M109 Gout, unspecified: Secondary | ICD-10-CM

## 2014-11-01 LAB — EXPECTORATED SPUTUM ASSESSMENT W REFEX TO RESP CULTURE

## 2014-11-01 LAB — GLUCOSE, CAPILLARY
Glucose-Capillary: 343 mg/dL — ABNORMAL HIGH (ref 70–99)
Glucose-Capillary: 326 mg/dL — ABNORMAL HIGH (ref 70–99)
Glucose-Capillary: 344 mg/dL — ABNORMAL HIGH (ref 70–99)

## 2014-11-01 LAB — EXPECTORATED SPUTUM ASSESSMENT W GRAM STAIN, RFLX TO RESP C

## 2014-11-01 MED ORDER — INSULIN ASPART PROT & ASPART (70-30 MIX) 100 UNIT/ML ~~LOC~~ SUSP
30.0000 [IU] | Freq: Every day | SUBCUTANEOUS | Status: DC
  Filled 2014-11-01: qty 10

## 2014-11-01 MED ORDER — TRAMADOL HCL 50 MG PO TABS: 100.0000 mg | ORAL_TABLET | Freq: Two times a day (BID) | ORAL | Status: DC | PRN

## 2014-11-01 MED ORDER — INSULIN ASPART PROT & ASPART (70-30 MIX) 100 UNIT/ML ~~LOC~~ SUSP: 20.0000 [IU] | Freq: Two times a day (BID) | SUBCUTANEOUS | Status: DC

## 2014-11-01 MED ORDER — PREDNISONE 20 MG PO TABS: ORAL_TABLET | ORAL | Status: DC

## 2014-11-01 MED ORDER — INSULIN ASPART PROT & ASPART (70-30 MIX) 100 UNIT/ML ~~LOC~~ SUSP
20.0000 [IU] | Freq: Every day | SUBCUTANEOUS | Status: DC
  Filled 2014-11-01: qty 10

## 2014-11-01 NOTE — Discharge Summary (Signed)
Physician Discharge Summary  MARWIN PRIMMER EXN:[id number] DOB: [birth year] DOA: 10/26/2014  PCP: Tivis Ringer, MD  Admit date: 10/26/2014 Discharge date: 11/01/2014  Time spent: 50 minutes  Recommendations for Outpatient Follow-up:  1. F/u on skin biopsy results 2. F/u with Pulmonary- Dr Nelda Marseille 3. F/u on autoimmune w/u 4. Please contact Dr Howell Rucks [phone] if any questions about skin biopsy results  Discharge Condition: stable Diet recommendation: diabetic, heart healthy  Discharge Diagnoses:  Principal Problem:   Fever, joint pain, left wrist swelling  Active Problems:   Rash   Anemia   Cough   Benign essential HTN   DM type 2, goal A1c below 7   History of present illness:  59 y/o male with PMH of IDDM, HTN, GERD, Gout presented with progressively worsening of blisters associated with fever, chills, generalized pains, weakness, tiredness, loss of appetite for 4 weeks. He reports the rash began 4 weeks ago. He has been Dr. Neldon Mc (allergist) has had testing for the rash, and received oral prednisone; Patient noted to be febrile in ED. ER physician d/w Dr. Linus Salmons who recommended to admit to r/o infectious process.  Hospital Course:  Fever (99-100 degrees),  joint pains - no significant leukocytosis, 2 sets of blood cultures negative -patient started on IV Levaquin and Vanc on 3/5- stopped Levaquin and Vanc 3/7 as no improvement in symptoms from it - was on Prednisone 5 mg daily started by allergist- stopped this a well on 3/7 as it did not seem to be helping -continued to have a fevers, swelling in left hand and pain in b/l shoulders- started on high dose steroids for possible diffuse gout on 3/9 and fevers have subsequently resolved- tapered to 40 mg of Prednisone today- will do rapid taper which will end in 3 days - spoke with dr Dagmar Hait today and updated him- he agrees with the steroid taper and will see patient back in the office within a week - CRP elevated at 4.1, ESR 60-  repeating today - RF- neg - B burg neg - HIV neg - RPR neg - RMSF titers neg ANA, ANCA CCP negative (perfromed twice) - ECHO unrevealing- see report below - have formally asked for ID consult- Dr Baxter Flattery has low suspicious of infectious etiology - I spoke with Dr Neldon Mc - he has faxed over lab results obtained in his office which I have reviewed with Dr Baxter Flattery- ANA is positive but no titer's reported Sjogrens SSA is 3.8 (0- 0.9) and ESR and CPK are elevated as they are here -discussed with Dr Charlestine Night (rheum) - reviewed lab results with him- he does not feel this is an autoimmune illness  Rash - appears unrelated to above fevers and joint pains which resolved with steroids - Sx performed skin biopsy - report still pending as it was a send out- Dr Dagmar Hait to f/u in office  Cough with yellow sputum and sore throat - asked pulm for their opinion- PFTs done which are normal - Cough, interestingly also resolved as of yesterday - Dr Nelda Marseille recommending f/u in off  IDDM - cont insulin regimen, HA1c 8.5 - sugars rising with steroids -we had been holding his home dose of 70/30 as sugars were controlled in the hospital - I am resuming this and giving him a dose prior to d/c today  HTN, stable - cont ARB   Procedures:  Skin biopsy 3/8  Consultations:  ID  Pulm   Discharge Exam: [place] [employer]   10/30/14 0605 10/31/14 0705 11/01/14 0700  Weight: 105.87 kg (233 lb 6.4 oz) 103 kg (227 lb 1.2 oz) 103.1 kg (227 lb 4.7 oz)   Filed Vitals:   11/01/14 0554  BP: 144/64  Pulse: 67  Temp: 98.5 F (36.9 C)  Resp: 16    General: AAO x 3, no distress Cardiovascular: RRR, no murmurs  Respiratory: clear to auscultation bilaterally GI: soft, non-tender, non-distended, bowel sound positive  Discharge Instructions You were cared for by a hospitalist during your hospital stay. If you have any questions about your discharge medications or the care you received while you were in  the hospital after you are discharged, you can call the unit and asked to speak with the hospitalist on call if the hospitalist that took care of you is not available. Once you are discharged, your primary care physician will handle any further medical issues. Please note that NO REFILLS for any discharge medications will be authorized once you are discharged, as it is imperative that you return to your primary care physician (or establish a relationship with a primary care physician if you do not have one) for your aftercare needs so that they can reassess your need for medications and monitor your lab values.      Discharge Instructions    Diet - low sodium heart healthy    Complete by:  As directed      Increase activity slowly    Complete by:  As directed             Medication List    STOP taking these medications        levofloxacin 500 MG tablet  Commonly known as:  LEVAQUIN      TAKE these medications        allopurinol 300 MG tablet  Commonly known as:  ZYLOPRIM  Take 300 mg by mouth daily.     aspirin EC 81 MG tablet  Take 81 mg by mouth daily.     cetirizine 10 MG tablet  Commonly known as:  ZYRTEC  Take 10 mg by mouth every morning.     fluticasone 50 MCG/ACT nasal spray  Commonly known as:  FLONASE  Place 2 sprays into both nostrils daily.     gabapentin 600 MG tablet  Commonly known as:  NEURONTIN  Take 600 mg by mouth 2 (two) times daily.     insulin aspart protamine- aspart (70-30) 100 UNIT/ML injection  Commonly known as:  NOVOLOG MIX 70/30  Inject 20-30 Units into the skin 2 (two) times daily. 30 units in the morning and 20 units in the evening     insulin glargine 100 UNIT/ML injection  Commonly known as:  LANTUS  Inject 25 Units into the skin at bedtime.     olmesartan 40 MG tablet  Commonly known as:  BENICAR  Take 40 mg by mouth daily.     predniSONE 20 MG tablet  Commonly known as:  DELTASONE  Take 20 mg tomorrow, 10 the next day and 5  the last day     SINGULAIR 10 MG tablet  Generic drug:  montelukast  Take 10 mg by mouth at bedtime.     traMADol 50 MG tablet  Commonly known as:  ULTRAM  Take 2 tablets (100 mg total) by mouth every 12 (twelve) hours as needed for moderate pain.       Allergies  Allergen Reactions  . Sulfa Antibiotics Itching and Rash    Burning also  . Penicillins Itching and Rash  The results of significant diagnostics from this hospitalization (including imaging, microbiology, ancillary and laboratory) are listed below for reference.    Significant Diagnostic Studies: Dg Chest 2 View  10/26/2014   CLINICAL DATA:  Fever.  Generalized body aches.  Diabetes.  Cough.  EXAM: CHEST  2 VIEW  COMPARISON:  None.  FINDINGS: Lateral view degraded by patient arm position and mild motion. Midline trachea. Borderline cardiomegaly. Mediastinal contours otherwise within normal limits. No pleural effusion or pneumothorax. Clear lungs.  IMPRESSION: Borderline cardiomegaly, without acute disease.  Degraded lateral view.   Electronically Signed   By: Jeronimo Greaves M.D.   On: 10/26/2014 17:20   Ct Chest W Contrast  10/27/2014   CLINICAL DATA:  Acute onset of generalized body aches and fever. Initial encounter.  EXAM: CT CHEST, ABDOMEN, AND PELVIS WITH CONTRAST  TECHNIQUE: Multidetector CT imaging of the chest, abdomen and pelvis was performed following the standard protocol during bolus administration of intravenous contrast.  CONTRAST:  OMNIPAQUE IOHEXOL 300 MG/ML  SOLN  COMPARISON:  Renal ultrasound performed 06/26/2013, and MRI of the lumbar spine performed 05/19/2013  FINDINGS: CT CHEST FINDINGS  Mild bibasilar atelectasis or scarring is noted. Scattered blebs are noted within the right upper lobe, and to a lesser extent within the left upper lobe. There is nonspecific minimal peripheral haziness within both lungs, with a ring of peripheral sparing. No pleural effusion or pneumothorax is seen. No mass is  identified.  Scattered coronary artery calcification is seen. The mediastinum is otherwise unremarkable. Visualized mediastinal nodes remain normal in size. No pericardial effusion is identified. The great vessels are grossly unremarkable in appearance. The visualized portions of thyroid gland are unremarkable. No axillary lymphadenopathy is seen.  No acute osseous abnormalities are identified.  CT ABDOMEN AND PELVIS FINDINGS  A calcified granuloma is noted within the right hepatic lobe. The liver and spleen are otherwise unremarkable in appearance for. The gallbladder is within normal limits. The pancreas and adrenal glands are unremarkable.  Bilateral renal scarring is noted. Mild nonspecific perinephric stranding is noted bilaterally. The kidneys are otherwise unremarkable. There is no evidence of hydronephrosis. No renal or ureteral stones are seen.  No free fluid is identified. The small bowel is unremarkable in appearance. The stomach is within normal limits. No acute vascular abnormalities are seen. Scattered calcification is noted along the common iliac arteries bilaterally.  The appendix is normal in caliber, without evidence for appendicitis. Contrast progresses to the level of the distal descending colon. The colon is unremarkable in appearance.  The bladder is mildly distended and grossly unremarkable. The prostate remains normal in size. No inguinal lymphadenopathy is seen.  No acute osseous abnormalities are identified.  IMPRESSION: 1. No acute abnormality seen to explain the patient's symptoms. 2. Nonspecific minimal peripheral haziness noted within both lungs, with a ring of peripheral sparing. This is of uncertain significance. Mild bibasilar atelectasis or scarring noted. Scattered blebs within the upper lobes, more prominent on the right. 3. Scattered coronary artery calcifications seen. 4. Scattered calcification along the common iliac arteries bilaterally.   Electronically Signed   By: Roanna Raider M.D.   On: 10/27/2014 00:30   Ct Abdomen Pelvis W Contrast  10/27/2014   CLINICAL DATA:  Acute onset of generalized body aches and fever. Initial encounter.  EXAM: CT CHEST, ABDOMEN, AND PELVIS WITH CONTRAST  TECHNIQUE: Multidetector CT imaging of the chest, abdomen and pelvis was performed following the standard protocol during bolus administration of intravenous contrast.  CONTRAST:  161mL OMNIPAQUE IOHEXOL 300 MG/ML  SOLN  COMPARISON:  Renal ultrasound performed 06/26/2013, and MRI of the lumbar spine performed 05/19/2013  FINDINGS: CT CHEST FINDINGS  Mild bibasilar atelectasis or scarring is noted. Scattered blebs are noted within the right upper lobe, and to a lesser extent within the left upper lobe. There is nonspecific minimal peripheral haziness within both lungs, with a ring of peripheral sparing. No pleural effusion or pneumothorax is seen. No mass is identified.  Scattered coronary artery calcification is seen. The mediastinum is otherwise unremarkable. Visualized mediastinal nodes remain normal in size. No pericardial effusion is identified. The great vessels are grossly unremarkable in appearance. The visualized portions of thyroid gland are unremarkable. No axillary lymphadenopathy is seen.  No acute osseous abnormalities are identified.  CT ABDOMEN AND PELVIS FINDINGS  A calcified granuloma is noted within the right hepatic lobe. The liver and spleen are otherwise unremarkable in appearance for. The gallbladder is within normal limits. The pancreas and adrenal glands are unremarkable.  Bilateral renal scarring is noted. Mild nonspecific perinephric stranding is noted bilaterally. The kidneys are otherwise unremarkable. There is no evidence of hydronephrosis. No renal or ureteral stones are seen.  No free fluid is identified. The small bowel is unremarkable in appearance. The stomach is within normal limits. No acute vascular abnormalities are seen. Scattered calcification is noted along the  common iliac arteries bilaterally.  The appendix is normal in caliber, without evidence for appendicitis. Contrast progresses to the level of the distal descending colon. The colon is unremarkable in appearance.  The bladder is mildly distended and grossly unremarkable. The prostate remains normal in size. No inguinal lymphadenopathy is seen.  No acute osseous abnormalities are identified.  IMPRESSION: 1. No acute abnormality seen to explain the patient's symptoms. 2. Nonspecific minimal peripheral haziness noted within both lungs, with a ring of peripheral sparing. This is of uncertain significance. Mild bibasilar atelectasis or scarring noted. Scattered blebs within the upper lobes, more prominent on the right. 3. Scattered coronary artery calcifications seen. 4. Scattered calcification along the common iliac arteries bilaterally.   Electronically Signed   By: Garald Balding M.D.   On: 10/27/2014 00:30    Microbiology: Recent Results (from the past 240 hour(s))  Blood culture (routine x 2)     Status: None (Preliminary result)   Collection Time: 10/26/14 10:48 PM  Result Value Ref Range Status   Specimen Description BLOOD RIGHT HAND  Final   Special Requests BOTTLES DRAWN AEROBIC AND ANAEROBIC 4CC EACH  Final   Culture   Final           BLOOD CULTURE RECEIVED NO GROWTH TO DATE CULTURE WILL BE HELD FOR 5 DAYS BEFORE ISSUING A FINAL NEGATIVE REPORT Performed at [facility]    Report Status PENDING  Incomplete  Blood culture (routine x 2)     Status: None (Preliminary result)   Collection Time: 10/26/14 10:53 PM  Result Value Ref Range Status   Specimen Description BLOOD LEFT HAND  Final   Special Requests BOTTLES DRAWN AEROBIC AND ANAEROBIC 5CC EACH  Final   Culture   Final           BLOOD CULTURE RECEIVED NO GROWTH TO DATE CULTURE WILL BE HELD FOR 5 DAYS BEFORE ISSUING A FINAL NEGATIVE REPORT Performed at [facility]    Report Status PENDING  Incomplete  Culture, blood  (routine x 2)     Status: None (Preliminary result)   Collection  Time: 10/30/14 12:12 PM  Result Value Ref Range Status   Specimen Description BLOOD LEFT ANTECUBITAL  Final   Special Requests BOTTLES DRAWN AEROBIC AND ANAEROBIC 10CC  Final   Culture   Final           BLOOD CULTURE RECEIVED NO GROWTH TO DATE CULTURE WILL BE HELD FOR 5 DAYS BEFORE ISSUING A FINAL NEGATIVE REPORT Performed at [facility]    Report Status PENDING  Incomplete  Culture, blood (routine x 2)     Status: None (Preliminary result)   Collection Time: 10/30/14 12:24 PM  Result Value Ref Range Status   Specimen Description BLOOD RIGHT WRIST  Final   Special Requests BOTTLES DRAWN AEROBIC AND ANAEROBIC 10CC  Final   Culture   Final           BLOOD CULTURE RECEIVED NO GROWTH TO DATE CULTURE WILL BE HELD FOR 5 DAYS BEFORE ISSUING A FINAL NEGATIVE REPORT Performed at [facility]    Report Status PENDING  Incomplete     Labs: Basic Metabolic Panel:  Recent Labs Lab 10/26/14 1408 10/28/14 0658  NA 142 138  K 4.1 3.9  CL 106 108  CO2 32 27  GLUCOSE 162* 153*  BUN 10 9  CREATININE 1.30 1.01  CALCIUM 8.8 7.7*   Liver Function Tests:  Recent Labs Lab 10/26/14 1408  AST 35  ALT 24  ALKPHOS 56  BILITOT 1.0  PROT 6.8  ALBUMIN 2.9*   No results for input(s): LIPASE, AMYLASE in the last 168 hours. No results for input(s): AMMONIA in the last 168 hours. CBC:  Recent Labs Lab 10/26/14 1408 10/28/14 0658 10/30/14 1212  WBC 7.3 5.9 8.9  NEUTROABS 5.8  --  8.0*  HGB 12.9* 10.9* 11.1*  HCT 39.5 33.0* 34.5*  MCV 79.2 79.5 79.7  PLT 281 189 224   Cardiac Enzymes:  Recent Labs Lab 10/26/14 2250  CKTOTAL 227   BNP: BNP (last 3 results) No results for input(s): BNP in the last 8760 hours.  ProBNP (last 3 results) No results for input(s): PROBNP in the last 8760 hours.  CBG:  Recent Labs Lab 10/31/14 0918 10/31/14 1233 10/31/14 1709 10/31/14 2207 11/01/14 0804   GLUCAP 278* 314* 383* 321* 343*       SignedDebbe Odea, MD [facility] 11/01/2014, 10:07 AM

## 2014-11-02 ENCOUNTER — Encounter (HOSPITAL_COMMUNITY): Payer: Self-pay | Admitting: Physical Medicine and Rehabilitation

## 2014-11-02 ENCOUNTER — Emergency Department (HOSPITAL_COMMUNITY): Payer: 59

## 2014-11-02 ENCOUNTER — Inpatient Hospital Stay (HOSPITAL_COMMUNITY)
Admission: EM | Admit: 2014-11-02 | Discharge: 2014-11-05 | DRG: 546 | Disposition: A | Discharge status: Home / Self Care | Payer: 59 | Attending: Internal Medicine | Admitting: Internal Medicine

## 2014-11-02 DIAGNOSIS — E1165 Type 2 diabetes mellitus with hyperglycemia: Secondary | ICD-10-CM | POA: Diagnosis present

## 2014-11-02 DIAGNOSIS — T380X5A Adverse effect of glucocorticoids and synthetic analogues, initial encounter: Secondary | ICD-10-CM | POA: Diagnosis present

## 2014-11-02 DIAGNOSIS — K219 Gastro-esophageal reflux disease without esophagitis: Secondary | ICD-10-CM | POA: Diagnosis present

## 2014-11-02 DIAGNOSIS — I10 Essential (primary) hypertension: Secondary | ICD-10-CM | POA: Diagnosis present

## 2014-11-02 DIAGNOSIS — M109 Gout, unspecified: Secondary | ICD-10-CM | POA: Diagnosis present

## 2014-11-02 DIAGNOSIS — R509 Fever, unspecified: Secondary | ICD-10-CM | POA: Diagnosis not present

## 2014-11-02 DIAGNOSIS — D573 Sickle-cell trait: Secondary | ICD-10-CM | POA: Diagnosis present

## 2014-11-02 DIAGNOSIS — R0789 Other chest pain: Secondary | ICD-10-CM | POA: Diagnosis not present

## 2014-11-02 DIAGNOSIS — R634 Abnormal weight loss: Secondary | ICD-10-CM | POA: Diagnosis present

## 2014-11-02 DIAGNOSIS — R6889 Other general symptoms and signs: Secondary | ICD-10-CM

## 2014-11-02 DIAGNOSIS — Z87891 Personal history of nicotine dependence: Secondary | ICD-10-CM | POA: Diagnosis not present

## 2014-11-02 DIAGNOSIS — I951 Orthostatic hypotension: Secondary | ICD-10-CM | POA: Diagnosis present

## 2014-11-02 DIAGNOSIS — R0902 Hypoxemia: Secondary | ICD-10-CM

## 2014-11-02 DIAGNOSIS — Z8249 Family history of ischemic heart disease and other diseases of the circulatory system: Secondary | ICD-10-CM | POA: Diagnosis not present

## 2014-11-02 DIAGNOSIS — D649 Anemia, unspecified: Secondary | ICD-10-CM | POA: Diagnosis present

## 2014-11-02 DIAGNOSIS — Z833 Family history of diabetes mellitus: Secondary | ICD-10-CM

## 2014-11-02 DIAGNOSIS — Z881 Allergy status to other antibiotic agents status: Secondary | ICD-10-CM | POA: Diagnosis not present

## 2014-11-02 DIAGNOSIS — M545 Low back pain, unspecified: Secondary | ICD-10-CM

## 2014-11-02 DIAGNOSIS — Z794 Long term (current) use of insulin: Secondary | ICD-10-CM

## 2014-11-02 DIAGNOSIS — R21 Rash and other nonspecific skin eruption: Secondary | ICD-10-CM | POA: Diagnosis present

## 2014-11-02 DIAGNOSIS — Z880 Allergy status to penicillin: Secondary | ICD-10-CM | POA: Diagnosis not present

## 2014-11-02 DIAGNOSIS — E119 Type 2 diabetes mellitus without complications: Secondary | ICD-10-CM

## 2014-11-02 DIAGNOSIS — R52 Pain, unspecified: Secondary | ICD-10-CM | POA: Diagnosis present

## 2014-11-02 DIAGNOSIS — M5441 Lumbago with sciatica, right side: Secondary | ICD-10-CM

## 2014-11-02 DIAGNOSIS — R112 Nausea with vomiting, unspecified: Secondary | ICD-10-CM | POA: Diagnosis present

## 2014-11-02 DIAGNOSIS — Z7982 Long term (current) use of aspirin: Secondary | ICD-10-CM | POA: Diagnosis not present

## 2014-11-02 DIAGNOSIS — Z7951 Long term (current) use of inhaled steroids: Secondary | ICD-10-CM

## 2014-11-02 DIAGNOSIS — M3392 Dermatopolymyositis, unspecified with myopathy: Principal | ICD-10-CM | POA: Diagnosis present

## 2014-11-02 DIAGNOSIS — Z79899 Other long term (current) drug therapy: Secondary | ICD-10-CM

## 2014-11-02 DIAGNOSIS — M5442 Lumbago with sciatica, left side: Secondary | ICD-10-CM

## 2014-11-02 LAB — COMPREHENSIVE METABOLIC PANEL
ALT: 21 U/L (ref 0–53)
ANION GAP: 11 (ref 5–15)
AST: 31 U/L (ref 0–37)
Albumin: 2.8 g/dL — ABNORMAL LOW (ref 3.5–5.2)
Alkaline Phosphatase: 65 U/L (ref 39–117)
BUN: 18 mg/dL (ref 6–23)
CHLORIDE: 106 mmol/L (ref 96–112)
CO2: 27 mmol/L (ref 19–32)
Calcium: 8.8 mg/dL (ref 8.4–10.5)
Creatinine, Ser: 1.32 mg/dL (ref 0.50–1.35)
GFR calc Af Amer: 66 mL/min — ABNORMAL LOW (ref >90)
GFR, EST NON AFRICAN AMERICAN: 57 mL/min — AB (ref >90)
Glucose, Bld: 144 mg/dL — ABNORMAL HIGH (ref 70–99)
POTASSIUM: 3.9 mmol/L (ref 3.5–5.1)
Sodium: 144 mmol/L (ref 135–145)
Total Bilirubin: 0.6 mg/dL (ref 0.3–1.2)
Total Protein: 6.6 g/dL (ref 6.0–8.3)

## 2014-11-02 LAB — URINALYSIS, ROUTINE W REFLEX MICROSCOPIC
Bilirubin Urine: NEGATIVE
Glucose, UA: NEGATIVE mg/dL
Hgb urine dipstick: NEGATIVE
Ketones, ur: NEGATIVE mg/dL
Leukocytes, UA: NEGATIVE
Nitrite: NEGATIVE
PH: 6 (ref 5.0–8.0)
Protein, ur: NEGATIVE mg/dL
SPECIFIC GRAVITY, URINE: 1.014 (ref 1.005–1.030)
Urobilinogen, UA: 1 mg/dL (ref 0.0–1.0)

## 2014-11-02 LAB — CBC WITH DIFFERENTIAL/PLATELET
BASOS PCT: 0 % (ref 0–1)
Basophils Absolute: 0 10*3/uL (ref 0.0–0.1)
EOS ABS: 0 10*3/uL (ref 0.0–0.7)
Eosinophils Relative: 0 % (ref 0–5)
HEMATOCRIT: 37.1 % — AB (ref 39.0–52.0)
Hemoglobin: 12.1 g/dL — ABNORMAL LOW (ref 13.0–17.0)
LYMPHS ABS: 1 10*3/uL (ref 0.7–4.0)
Lymphocytes Relative: 9 % — ABNORMAL LOW (ref 12–46)
MCH: 25.5 pg — ABNORMAL LOW (ref 26.0–34.0)
MCHC: 32.6 g/dL (ref 30.0–36.0)
MCV: 78.3 fL (ref 78.0–100.0)
MONOS PCT: 5 % (ref 3–12)
Monocytes Absolute: 0.6 10*3/uL (ref 0.1–1.0)
Neutro Abs: 8.9 10*3/uL — ABNORMAL HIGH (ref 1.7–7.7)
Neutrophils Relative %: 86 % — ABNORMAL HIGH (ref 43–77)
Platelets: 325 10*3/uL (ref 150–400)
RBC: 4.74 MIL/uL (ref 4.22–5.81)
RDW: 13.5 % (ref 11.5–15.5)
WBC: 10.5 10*3/uL (ref 4.0–10.5)

## 2014-11-02 LAB — CULTURE, BLOOD (ROUTINE X 2)
CULTURE: NO GROWTH
Culture: NO GROWTH

## 2014-11-02 LAB — GLUCOSE, CAPILLARY
GLUCOSE-CAPILLARY: 149 mg/dL — AB (ref 70–99)
Glucose-Capillary: 114 mg/dL — ABNORMAL HIGH (ref 70–99)

## 2014-11-02 LAB — CBG MONITORING, ED: GLUCOSE-CAPILLARY: 142 mg/dL — AB (ref 70–99)

## 2014-11-02 LAB — TROPONIN I: Troponin I: <0.03 ng/mL (ref <0.031)

## 2014-11-02 LAB — INFLUENZA PANEL BY PCR (TYPE A & B)
H1N1 flu by pcr: NOT DETECTED
Influenza A By PCR: NEGATIVE
Influenza B By PCR: NEGATIVE

## 2014-11-02 MED ORDER — ONDANSETRON HCL 4 MG/2ML IJ SOLN
4.0000 mg | Freq: Once | INTRAMUSCULAR | Status: AC
  Administered 2014-11-02: 4 mg via INTRAVENOUS
  Filled 2014-11-02: qty 2

## 2014-11-02 MED ORDER — FENTANYL CITRATE 0.05 MG/ML IJ SOLN
100.0000 ug | Freq: Once | INTRAMUSCULAR | Status: AC
  Administered 2014-11-02: 100 ug via INTRAVENOUS
  Filled 2014-11-02: qty 2

## 2014-11-02 MED ORDER — ACETAMINOPHEN 325 MG PO TABS
650.0000 mg | ORAL_TABLET | Freq: Once | ORAL | Status: AC
  Administered 2014-11-02: 650 mg via ORAL
  Filled 2014-11-02: qty 2

## 2014-11-02 MED ORDER — GABAPENTIN 600 MG PO TABS
600.0000 mg | ORAL_TABLET | Freq: Two times a day (BID) | ORAL | Status: DC
  Administered 2014-11-02 – 2014-11-05 (×6): 600 mg via ORAL
  Filled 2014-11-02 (×6): qty 1

## 2014-11-02 MED ORDER — ASPIRIN EC 81 MG PO TBEC
81.0000 mg | DELAYED_RELEASE_TABLET | Freq: Every day | ORAL | Status: DC
  Administered 2014-11-03 – 2014-11-05 (×3): 81 mg via ORAL
  Filled 2014-11-02 (×3): qty 1

## 2014-11-02 MED ORDER — PREDNISONE 20 MG PO TABS
40.0000 mg | ORAL_TABLET | Freq: Every day | ORAL | Status: DC
  Administered 2014-11-03: 40 mg via ORAL
  Filled 2014-11-02: qty 2

## 2014-11-02 MED ORDER — INSULIN ASPART 100 UNIT/ML ~~LOC~~ SOLN
0.0000 [IU] | Freq: Three times a day (TID) | SUBCUTANEOUS | Status: DC
  Administered 2014-11-03: 3 [IU] via SUBCUTANEOUS
  Administered 2014-11-03: 5 [IU] via SUBCUTANEOUS
  Administered 2014-11-03: 2 [IU] via SUBCUTANEOUS
  Administered 2014-11-04: 11 [IU] via SUBCUTANEOUS
  Administered 2014-11-04: 4 [IU] via SUBCUTANEOUS
  Administered 2014-11-04 – 2014-11-05 (×3): 11 [IU] via SUBCUTANEOUS

## 2014-11-02 MED ORDER — OSELTAMIVIR PHOSPHATE 75 MG PO CAPS
75.0000 mg | ORAL_CAPSULE | Freq: Once | ORAL | Status: AC
  Administered 2014-11-02: 75 mg via ORAL
  Filled 2014-11-02: qty 1

## 2014-11-02 MED ORDER — POLYVINYL ALCOHOL 1.4 % OP SOLN: 1.0000 [drp] | OPHTHALMIC | Status: DC | PRN

## 2014-11-02 MED ORDER — MONTELUKAST SODIUM 10 MG PO TABS
10.0000 mg | ORAL_TABLET | Freq: Every day | ORAL | Status: DC
  Administered 2014-11-02 – 2014-11-04 (×3): 10 mg via ORAL
  Filled 2014-11-02 (×3): qty 1

## 2014-11-02 MED ORDER — IRBESARTAN 300 MG PO TABS
300.0000 mg | ORAL_TABLET | Freq: Every day | ORAL | Status: DC
  Administered 2014-11-03 – 2014-11-05 (×3): 300 mg via ORAL
  Filled 2014-11-02 (×3): qty 1

## 2014-11-02 MED ORDER — MORPHINE SULFATE 2 MG/ML IJ SOLN
2.0000 mg | INTRAMUSCULAR | Status: DC | PRN
  Administered 2014-11-02 – 2014-11-03 (×3): 2 mg via INTRAVENOUS
  Filled 2014-11-02 (×3): qty 1

## 2014-11-02 MED ORDER — FAMOTIDINE 20 MG PO TABS
40.0000 mg | ORAL_TABLET | Freq: Every day | ORAL | Status: DC
  Administered 2014-11-03 – 2014-11-05 (×3): 40 mg via ORAL
  Filled 2014-11-02 (×4): qty 2

## 2014-11-02 MED ORDER — MORPHINE SULFATE 4 MG/ML IJ SOLN
4.0000 mg | Freq: Once | INTRAMUSCULAR | Status: AC
  Administered 2014-11-02: 4 mg via INTRAVENOUS
  Filled 2014-11-02: qty 1

## 2014-11-02 MED ORDER — ALBUTEROL SULFATE (2.5 MG/3ML) 0.083% IN NEBU: 3.0000 mL | INHALATION_SOLUTION | Freq: Four times a day (QID) | RESPIRATORY_TRACT | Status: DC | PRN

## 2014-11-02 MED ORDER — FLUTICASONE PROPIONATE 50 MCG/ACT NA SUSP
2.0000 | Freq: Every day | NASAL | Status: DC
  Administered 2014-11-03 – 2014-11-05 (×2): 2 via NASAL
  Filled 2014-11-02 (×2): qty 16

## 2014-11-02 MED ORDER — HYDROCODONE-ACETAMINOPHEN 5-325 MG PO TABS
1.0000 | ORAL_TABLET | ORAL | Status: DC | PRN
  Administered 2014-11-02 – 2014-11-04 (×6): 2 via ORAL
  Filled 2014-11-02 (×6): qty 2

## 2014-11-02 MED ORDER — POLYETHYLENE GLYCOL 3350 17 G PO PACK: 17.0000 g | PACK | Freq: Every day | ORAL | Status: DC | PRN

## 2014-11-02 MED ORDER — LORATADINE 10 MG PO TABS
10.0000 mg | ORAL_TABLET | Freq: Every day | ORAL | Status: DC
  Administered 2014-11-03 – 2014-11-05 (×3): 10 mg via ORAL
  Filled 2014-11-02 (×3): qty 1

## 2014-11-02 MED ORDER — INSULIN ASPART 100 UNIT/ML ~~LOC~~ SOLN
4.0000 [IU] | Freq: Three times a day (TID) | SUBCUTANEOUS | Status: DC
  Administered 2014-11-02 – 2014-11-04 (×6): 4 [IU] via SUBCUTANEOUS

## 2014-11-02 MED ORDER — INSULIN GLARGINE 100 UNIT/ML ~~LOC~~ SOLN
20.0000 [IU] | Freq: Every morning | SUBCUTANEOUS | Status: DC
  Administered 2014-11-03 – 2014-11-04 (×2): 20 [IU] via SUBCUTANEOUS
  Filled 2014-11-02 (×2): qty 0.2

## 2014-11-02 MED ORDER — INSULIN ASPART 100 UNIT/ML ~~LOC~~ SOLN: 0.0000 [IU] | Freq: Three times a day (TID) | SUBCUTANEOUS | Status: DC

## 2014-11-02 MED ORDER — ALUM & MAG HYDROXIDE-SIMETH 200-200-20 MG/5ML PO SUSP: 30.0000 mL | Freq: Four times a day (QID) | ORAL | Status: DC | PRN

## 2014-11-02 MED ORDER — PRAMOXINE-ZINC OXIDE IN MO 1-12.5 % RE OINT: 1.0000 "application " | TOPICAL_OINTMENT | RECTAL | Status: DC | PRN

## 2014-11-02 MED ORDER — ONDANSETRON HCL 4 MG PO TABS: 4.0000 mg | ORAL_TABLET | Freq: Four times a day (QID) | ORAL | Status: DC | PRN

## 2014-11-02 MED ORDER — PHENOL 1.4 % MT LIQD: 1.0000 | OROMUCOSAL | Status: DC | PRN

## 2014-11-02 MED ORDER — SODIUM CHLORIDE 0.9 % IV BOLUS (SEPSIS)
1000.0000 mL | Freq: Once | INTRAVENOUS | Status: AC
  Administered 2014-11-02: 1000 mL via INTRAVENOUS

## 2014-11-02 MED ORDER — OSELTAMIVIR PHOSPHATE 75 MG PO CAPS
75.0000 mg | ORAL_CAPSULE | Freq: Two times a day (BID) | ORAL | Status: DC
  Administered 2014-11-02: 75 mg via ORAL
  Filled 2014-11-02 (×3): qty 1

## 2014-11-02 MED ORDER — SODIUM CHLORIDE 0.9 % IV SOLN
INTRAVENOUS | Status: DC
  Administered 2014-11-02 – 2014-11-03 (×2): via INTRAVENOUS

## 2014-11-02 MED ORDER — ALLOPURINOL 300 MG PO TABS
300.0000 mg | ORAL_TABLET | Freq: Every day | ORAL | Status: DC
  Administered 2014-11-03 – 2014-11-05 (×3): 300 mg via ORAL
  Filled 2014-11-02 (×3): qty 1

## 2014-11-02 MED ORDER — FLUTICASONE PROPIONATE HFA 44 MCG/ACT IN AERO
2.0000 | INHALATION_SPRAY | Freq: Two times a day (BID) | RESPIRATORY_TRACT | Status: DC
  Administered 2014-11-02 – 2014-11-05 (×6): 2 via RESPIRATORY_TRACT
  Filled 2014-11-02: qty 10.6

## 2014-11-02 MED ORDER — ENOXAPARIN SODIUM 40 MG/0.4ML ~~LOC~~ SOLN
40.0000 mg | SUBCUTANEOUS | Status: DC
  Administered 2014-11-02 – 2014-11-04 (×3): 40 mg via SUBCUTANEOUS
  Filled 2014-11-02 (×3): qty 0.4

## 2014-11-02 MED ORDER — ONDANSETRON HCL 4 MG/2ML IJ SOLN: 4.0000 mg | Freq: Four times a day (QID) | INTRAMUSCULAR | Status: DC | PRN

## 2014-11-02 NOTE — ED Notes (Signed)
Pt presents to department for evaluation of pain all over body. Was discharged from hospital yesterday. Also states nausea/vomiting. Pt is alert and oriented x4. NAD.

## 2014-11-02 NOTE — ED Provider Notes (Signed)
CSN: [id number]     Arrival date & time 11/02/14  1031 History   First MD Initiated Contact with Patient 11/02/14 1145     Chief Complaint  Patient presents with  . Generalized Body Aches     (Consider location/radiation/quality/duration/timing/severity/associated sxs/prior Treatment) HPI Comments: Patient is a 59 year old male PMHx significant for DM, HTN presenting to the emergency department for evaluation of nausea, nonbloody nonbilious vomiting, nonbloody diarrhea, myalgias, arthralgias, fever that began this morning. No modifying factors identified. Patient has been unable to try any medications. He was just released from the hospital yesterday after being worked up for a fever and a rash. He states he still not sure what was causing his symptoms. Positive sick contacts in the hospital. He does not use oxygen at home.    Past Medical History  Diagnosis Date  . Diabetes mellitus without complication   . Hypertension   . GERD (gastroesophageal reflux disease)   . Anemia   . Sickle cell trait   . Gout    Past Surgical History  Procedure Laterality Date  . Shoulder surgery    . Knee reconstruction Right   . Vasectomy     Family History  Problem Relation Age of Onset  . Hypertension Mother   . Diabetes Mother   . Hypertension Father    History  Substance Use Topics  . Smoking status: Former Smoker -- 1.00 packs/day for 20 years    Types: Cigarettes    Quit date: 10/29/1992  . Smokeless tobacco: Not on file  . Alcohol Use: No    Review of Systems  Constitutional: Positive for fever, chills and fatigue.  Respiratory: Positive for cough and chest tightness.   Gastrointestinal: Positive for nausea, vomiting, abdominal pain and diarrhea.  Musculoskeletal: Positive for myalgias and arthralgias.  All other systems reviewed and are negative.     Allergies  Sulfa antibiotics and Penicillins  Home Medications   Prior to Admission medications   Medication Sig Start  Date End Date Taking? Authorizing Provider  albuterol (PROVENTIL HFA;VENTOLIN HFA) 108 (90 BASE) MCG/ACT inhaler Inhale 2 puffs into the lungs every 6 (six) hours as needed for wheezing or shortness of breath.   Yes Historical Provider, MD  allopurinol (ZYLOPRIM) 300 MG tablet Take 300 mg by mouth daily.   Yes Historical Provider, MD  aspirin EC 81 MG tablet Take 81 mg by mouth daily.   Yes Historical Provider, MD  fluticasone (FLONASE) 50 MCG/ACT nasal spray Place 2 sprays into both nostrils daily.   Yes Historical Provider, MD  gabapentin (NEURONTIN) 600 MG tablet Take 600 mg by mouth 2 (two) times daily.   Yes Historical Provider, MD  insulin glargine (LANTUS) 100 UNIT/ML injection Inject 20 Units into the skin every morning.    Yes Historical Provider, MD  mometasone [facility]) 220 MCG/INH inhaler Inhale 2 puffs into the lungs daily.   Yes Historical Provider, MD  olmesartan (BENICAR) 40 MG tablet Take 40 mg by mouth daily.   Yes Historical Provider, MD  pramoxine-mineral oil-zinc (TUCKS) 1-12.5 % rectal ointment Place 1 application rectally every 2 (two) hours as needed for itching.   Yes Historical Provider, MD  predniSONE (DELTASONE) 20 MG tablet Take 20 mg tomorrow, 10 the next day and 5 the last day 11/01/14  Yes Calvert Cantor, MD  Propylene Glycol 0.6 % SOLN Apply 1 drop to eye as needed (dry eye).   Yes Historical Provider, MD  RANITIDINE HCL PO Take 1 tablet by mouth daily.  Yes Historical Provider, MD  traMADol (ULTRAM) 50 MG tablet Take 2 tablets (100 mg total) by mouth every 12 (twelve) hours as needed for moderate pain. Patient taking differently: Take 50 mg by mouth every 6 (six) hours as needed for moderate pain.  11/01/14  Yes Calvert CantorSaima Rizwan, MD  cetirizine (ZYRTEC) 10 MG tablet Take 10 mg by mouth every morning.     Historical Provider, MD  insulin aspart protamine- aspart (NOVOLOG MIX 70/30) (70-30) 100 UNIT/ML injection Inject 20-30 Units into the skin 2 (two) times daily. 30 units  in the morning and 20 units in the evening    Historical Provider, MD  montelukast (SINGULAIR) 10 MG tablet Take 10 mg by mouth at bedtime.    Historical Provider, MD   BP 106/60 mmHg  Pulse 78  Temp(Src) 100.1 F (37.8 C) (Oral)  Resp 19  Ht 6' 1.5" (1.867 m)  Wt 223 lb (101.152 kg)  BMI 29.02 kg/m2  SpO2 96% Physical Exam  Constitutional: He is oriented to person, place, and time. He appears well-developed and well-nourished. He is sleeping. He is easily aroused. No distress.  HENT:  Head: Normocephalic and atraumatic.  Right Ear: External ear normal.  Left Ear: External ear normal.  Nose: Nose normal.  Mouth/Throat: Uvula is midline and oropharynx is clear and moist. Mucous membranes are dry.  Eyes: Conjunctivae are normal.  Neck: Normal range of motion. Neck supple.  Cardiovascular: Normal rate, regular rhythm and normal heart sounds.   Pulmonary/Chest: Effort normal and breath sounds normal. No respiratory distress.  Abdominal: Soft. There is no tenderness.  Musculoskeletal: Normal range of motion. He exhibits no edema.  Neurological: He is oriented to person, place, and time and easily aroused. No sensory deficit. GCS eye subscore is 4. GCS verbal subscore is 5. GCS motor subscore is 6.  Moves all extremities w/o ataxia.   Skin: Skin is warm and dry. He is not diaphoretic.  Psychiatric: He has a normal mood and affect.  Nursing note and vitals reviewed.   ED Course  Procedures (including critical care time) Medications  ondansetron (ZOFRAN) injection 4 mg (4 mg Intravenous Given 11/02/14 1226)  morphine 4 MG/ML injection 4 mg (4 mg Intravenous Given 11/02/14 1226)  acetaminophen (TYLENOL) tablet 650 mg (650 mg Oral Given 11/02/14 1259)  sodium chloride 0.9 % bolus 1,000 mL (0 mLs Intravenous Stopped 11/02/14 1300)  fentaNYL (SUBLIMAZE) injection 100 mcg (100 mcg Intravenous Given 11/02/14 1424)  oseltamivir (TAMIFLU) capsule 75 mg (75 mg Oral Given 11/02/14 1513)    Labs  Review Labs Reviewed  COMPREHENSIVE METABOLIC PANEL - Abnormal; Notable for the following:    Glucose, Bld 144 (*)    Albumin 2.8 (*)    GFR calc non Af Amer 57 (*)    GFR calc Af Amer 66 (*)    All other components within normal limits  CBC WITH DIFFERENTIAL/PLATELET - Abnormal; Notable for the following:    Hemoglobin 12.1 (*)    HCT 37.1 (*)    MCH 25.5 (*)    Neutrophils Relative % 86 (*)    Neutro Abs 8.9 (*)    Lymphocytes Relative 9 (*)    All other components within normal limits  CBG MONITORING, ED - Abnormal; Notable for the following:    Glucose-Capillary 142 (*)    All other components within normal limits  TROPONIN I  URINALYSIS, ROUTINE W REFLEX MICROSCOPIC  INFLUENZA PANEL BY PCR (TYPE A & B, H1N1)    Imaging Review  Dg Chest Port 1 View  11/02/2014   CLINICAL DATA:  nausea and vomiting with generalized pain all over body. Pt was discharged from hospital yesterday. Hx HTN, gout, sickle cell anemia  EXAM: PORTABLE CHEST - 1 VIEW  COMPARISON:  10/26/2014  FINDINGS: Lungs are clear. Heart size and mediastinal contours are within normal limits. No effusion. Visualized skeletal structures are unremarkable.  IMPRESSION: No acute cardiopulmonary disease.   Electronically Signed   By: Corlis Leak M.D.   On: 11/02/2014 13:04     EKG Interpretation   Date/Time:  Saturday November 02 2014 12:11:21 EST Ventricular Rate:  91 PR Interval:  146 QRS Duration: 76 QT Interval:  329 QTC Calculation: 405 R Axis:   8 Text Interpretation:  Sinus rhythm Abnormal R-wave progression, early  transition Borderline T wave abnormalities Confirmed by Rubin Payor  MD,  Harrold Donath [phone]) on 11/02/2014 12:19:35 PM      MDM   Final diagnoses:  Orthostasis  Flu-like symptoms  Hypoxia   Filed Vitals:   11/02/14 1423  BP:   Pulse:   Temp: 100.1 F (37.8 C)  Resp:    I have reviewed nursing notes, vital signs, and all appropriate lab and imaging results for this patient. IV fluids and pain  medication and antiemetics given. Patient able to tolerate by mouth intake. Improvement of fever with Tylenol administration. Influenza panel sent. Will start on Tamiflu given symptoms. Chest x-ray reviewed without acute abnormality, unsure why patient is hypoxic at this time. We'll admitted to [facility] for further evaluation and management. Patient d/w with Dr. Rubin Payor, agrees with plan.      Francee Piccolo, PA-C 11/02/14 1546  Benjiman Core, MD 11/02/14 1630

## 2014-11-02 NOTE — H&P (Signed)
[facility] History and Physical                                                                                    Derek Anthony, is a 59 y.o. male  MRN: [id number]   DOB - 1955-09-23  Admit Date - 11/02/2014  Outpatient Primary MD for the patient is Tivis Ringer, MD  With History of -  Past Medical History  Diagnosis Date  . Diabetes mellitus without complication   . Hypertension   . GERD (gastroesophageal reflux disease)   . Anemia   . Sickle cell trait   . Gout       Past Surgical History  Procedure Laterality Date  . Shoulder surgery    . Knee reconstruction Right   . Vasectomy      in for   Chief Complaint  Patient presents with  . Generalized Body Aches     HPI  Derek Anthony  is a 59 y.o. male, with a past medical history significant for diabetes, hypertension, GERD, anemia and gout. He was discharged from [facility] yesterday 3/11 after staying for 6 days for a workup of arthralgias, rash, fever and cough.   He was seen by both infectious disease and pulmonary and had an extensive workup that is outlined in Dr. Reggy Eye discharge summary of 3/11.   A skin biopsy was done and he was to be discharged home on prednisone and closely followed by his primary care physician. Derek Anthony went home on 3/11 and felt fairly well in the evening until after dinner, when he began to feel severely fatigued. In the morning he got up having uncontrolled arthralgias particularly in his pectoral muscles and bilateral shoulders. He tried to use tramadol but did not receive relief. He went to urgent care and vomited in the parking lot. The urgent care physician directed him to [facility] ER. In the ER parking lot he once again vomited. Labs in the ER today were unrevealing and CXR was clear.  He continues to have a low grade fever (100.4).  His wife was willing to take him home if we could control his pain.  Because he appeared particularly weak and fatigued - ambulating oxygen  sats and orthostatic vital signs were ordered.  His oxygen sat at rest varied between 86 - 87%.  Orthostatic vital signs were positive (119/63 lying, 93/61 standing).  We will readmit him today for orthostatic hypotension, hypoxia, weakness, and pain control.  Review of Systems   In addition to the HPI above,  No Headache, No changes with Vision or hearing, No problems swallowing food or Liquids, No Chest pain, no current Cough or he doesn't complain of shortness of breath, Bowel movements are regular, No Blood in stool or Urine, No dysuria, No new skin rashes or bruises, No new joints pains-aches, Still with pain in his chest and shoulders. No new weakness, tingling, numbness in any extremity, No recent weight gain or loss, A full 10 point Review of Systems was done, except as stated above, all other Review of Systems were negative.  Social History History  Substance Use Topics  . Smoking status: Former Smoker --  1.00 packs/day for 20 years    Types: Cigarettes    Quit date: 10/29/1992  . Smokeless tobacco: Not on file  . Alcohol Use: No    Family History Family History  Problem Relation Age of Onset  . Hypertension Mother   . Diabetes Mother   . Hypertension Father     Prior to Admission medications   Medication Sig Start Date End Date Taking? Authorizing Provider  albuterol (PROVENTIL HFA;VENTOLIN HFA) 108 (90 BASE) MCG/ACT inhaler Inhale 2 puffs into the lungs every 6 (six) hours as needed for wheezing or shortness of breath.   Yes Historical Provider, MD  allopurinol (ZYLOPRIM) 300 MG tablet Take 300 mg by mouth daily.   Yes Historical Provider, MD  aspirin EC 81 MG tablet Take 81 mg by mouth daily.   Yes Historical Provider, MD  fluticasone (FLONASE) 50 MCG/ACT nasal spray Place 2 sprays into both nostrils daily.   Yes Historical Provider, MD  gabapentin (NEURONTIN) 600 MG tablet Take 600 mg by mouth 2 (two) times daily.   Yes Historical Provider, MD  insulin glargine  (LANTUS) 100 UNIT/ML injection Inject 20 Units into the skin every morning.    Yes Historical Provider, MD  mometasone [facility]) 220 MCG/INH inhaler Inhale 2 puffs into the lungs daily.   Yes Historical Provider, MD  olmesartan (BENICAR) 40 MG tablet Take 40 mg by mouth daily.   Yes Historical Provider, MD  pramoxine-mineral oil-zinc (TUCKS) 1-12.5 % rectal ointment Place 1 application rectally every 2 (two) hours as needed for itching.   Yes Historical Provider, MD  predniSONE (DELTASONE) 20 MG tablet Take 20 mg tomorrow, 10 the next day and 5 the last day 11/01/14  Yes Debbe Odea, MD  Propylene Glycol 0.6 % SOLN Apply 1 drop to eye as needed (dry eye).   Yes Historical Provider, MD  RANITIDINE HCL PO Take 1 tablet by mouth daily.   Yes Historical Provider, MD  traMADol (ULTRAM) 50 MG tablet Take 2 tablets (100 mg total) by mouth every 12 (twelve) hours as needed for moderate pain. Patient taking differently: Take 50 mg by mouth every 6 (six) hours as needed for moderate pain.  11/01/14  Yes Debbe Odea, MD  cetirizine (ZYRTEC) 10 MG tablet Take 10 mg by mouth every morning.     Historical Provider, MD  insulin aspart protamine- aspart (NOVOLOG MIX 70/30) (70-30) 100 UNIT/ML injection Inject 20-30 Units into the skin 2 (two) times daily. 30 units in the morning and 20 units in the evening    Historical Provider, MD  montelukast (SINGULAIR) 10 MG tablet Take 10 mg by mouth at bedtime.    Historical Provider, MD    Allergies  Allergen Reactions  . Sulfa Antibiotics Itching and Rash    Burning also  . Penicillins Itching and Rash    Physical Exam  Vitals  Blood pressure 98/72, pulse 64, temperature 98.7 F (37.1 C), temperature source Oral, resp. rate 18, height 6' 1.5" (1.867 m), weight 101.152 kg (223 lb), SpO2 98 %.   General: wd, wn pleasant male lying in bed.  Appears very fatigued.  mentating slightly slow   Psych:  Normal affect and insight, Not Suicidal or Homicidal, Awake  Alert, Oriented X 3.  ENT:  Ears and Eyes appear Normal, Conjunctivae clear, PER. Moist oral mucosa without erythema or exudates.  Neck:  Supple, No lymphadenopathy appreciated  Respiratory:  Symmetrical chest wall movement, Good air movement bilaterally, CTAB.  Chest pain not reproducible by palpation.  Cardiac:  Slightly tachy, No Murmurs, no LE edema noted, no JVD.    Abdomen:  Positive bowel sounds, Soft, Non tender, Non distended,  No masses appreciated  Skin:  No Cyanosis, Normal Skin Turgor, No Skin Rash or Bruise.  Extremities:  Able to move all 4. 5/5 strength in each,  no effusions.  Data Review  CBC  Recent Labs Lab 10/28/14 0658 10/30/14 1212 11/02/14 1210  WBC 5.9 8.9 10.5  HGB 10.9* 11.1* 12.1*  HCT 33.0* 34.5* 37.1*  PLT 189 224 325  MCV 79.5 79.7 78.3  MCH 26.3 25.6* 25.5*  MCHC 33.0 32.2 32.6  RDW 13.8 13.7 13.5  LYMPHSABS  --  0.7 1.0  MONOABS  --  0.3 0.6  EOSABS  --  0.0 0.0  BASOSABS  --  0.0 0.0    Chemistries   Recent Labs Lab 10/28/14 0658 11/02/14 1210  NA 138 144  K 3.9 3.9  CL 108 106  CO2 27 27  GLUCOSE 153* 144*  BUN 9 18  CREATININE 1.01 1.32  CALCIUM 7.7* 8.8  AST  --  31  ALT  --  21  ALKPHOS  --  65  BILITOT  --  0.6    Coagulation profile  Recent Labs Lab 10/26/14 1916  INR 1.03     Cardiac Enzymes  Recent Labs Lab 11/02/14 1210  TROPONINI <0.03     Urinalysis    Component Value Date/Time   COLORURINE YELLOW 11/02/2014 1434   APPEARANCEUR CLEAR 11/02/2014 1434   LABSPEC 1.014 11/02/2014 1434   PHURINE 6.0 11/02/2014 [place] 11/02/2014 1434   HGBUR NEGATIVE 11/02/2014 1434   BILIRUBINUR NEGATIVE 11/02/2014 1434   KETONESUR NEGATIVE 11/02/2014 1434   PROTEINUR NEGATIVE 11/02/2014 1434   UROBILINOGEN 1.0 11/02/2014 1434   NITRITE NEGATIVE 11/02/2014 1434   LEUKOCYTESUR NEGATIVE 11/02/2014 1434    Imaging results:   Dg Chest 2 View  10/26/2014   CLINICAL DATA:  Fever.   Generalized body aches.  Diabetes.  Cough.  EXAM: CHEST  2 VIEW  COMPARISON:  None.  FINDINGS: Lateral view degraded by patient arm position and mild motion. Midline trachea. Borderline cardiomegaly. Mediastinal contours otherwise within normal limits. No pleural effusion or pneumothorax. Clear lungs.  IMPRESSION: Borderline cardiomegaly, without acute disease.  Degraded lateral view.   Electronically Signed   By: Abigail Miyamoto M.D.   On: 10/26/2014 17:20   Ct Chest W Contrast  10/27/2014   CLINICAL DATA:  Acute onset of generalized body aches and fever. Initial encounter.  EXAM: CT CHEST, ABDOMEN, AND PELVIS WITH CONTRAST  TECHNIQUE: Multidetector CT imaging of the chest, abdomen and pelvis was performed following the standard protocol during bolus administration of intravenous contrast.  CONTRAST:  153m OMNIPAQUE IOHEXOL 300 MG/ML  SOLN  COMPARISON:  Renal ultrasound performed 06/26/2013, and MRI of the lumbar spine performed 05/19/2013  FINDINGS: CT CHEST FINDINGS  Mild bibasilar atelectasis or scarring is noted. Scattered blebs are noted within the right upper lobe, and to a lesser extent within the left upper lobe. There is nonspecific minimal peripheral haziness within both lungs, with a ring of peripheral sparing. No pleural effusion or pneumothorax is seen. No mass is identified.  Scattered coronary artery calcification is seen. The mediastinum is otherwise unremarkable. Visualized mediastinal nodes remain normal in size. No pericardial effusion is identified. The great vessels are grossly unremarkable in appearance. The visualized portions of thyroid gland are unremarkable. No axillary lymphadenopathy is seen.  No  acute osseous abnormalities are identified.  CT ABDOMEN AND PELVIS FINDINGS  A calcified granuloma is noted within the right hepatic lobe. The liver and spleen are otherwise unremarkable in appearance for. The gallbladder is within normal limits. The pancreas and adrenal glands are unremarkable.   Bilateral renal scarring is noted. Mild nonspecific perinephric stranding is noted bilaterally. The kidneys are otherwise unremarkable. There is no evidence of hydronephrosis. No renal or ureteral stones are seen.  No free fluid is identified. The small bowel is unremarkable in appearance. The stomach is within normal limits. No acute vascular abnormalities are seen. Scattered calcification is noted along the common iliac arteries bilaterally.  The appendix is normal in caliber, without evidence for appendicitis. Contrast progresses to the level of the distal descending colon. The colon is unremarkable in appearance.  The bladder is mildly distended and grossly unremarkable. The prostate remains normal in size. No inguinal lymphadenopathy is seen.  No acute osseous abnormalities are identified.  IMPRESSION: 1. No acute abnormality seen to explain the patient's symptoms. 2. Nonspecific minimal peripheral haziness noted within both lungs, with a ring of peripheral sparing. This is of uncertain significance. Mild bibasilar atelectasis or scarring noted. Scattered blebs within the upper lobes, more prominent on the right. 3. Scattered coronary artery calcifications seen. 4. Scattered calcification along the common iliac arteries bilaterally.   Electronically Signed   By: Garald Balding M.D.   On: 10/27/2014 00:30   Ct Abdomen Pelvis W Contrast  10/27/2014   CLINICAL DATA:  Acute onset of generalized body aches and fever. Initial encounter.  EXAM: CT CHEST, ABDOMEN, AND PELVIS WITH CONTRAST  TECHNIQUE: Multidetector CT imaging of the chest, abdomen and pelvis was performed following the standard protocol during bolus administration of intravenous contrast.  CONTRAST:  167m OMNIPAQUE IOHEXOL 300 MG/ML  SOLN  COMPARISON:  Renal ultrasound performed 06/26/2013, and MRI of the lumbar spine performed 05/19/2013  FINDINGS: CT CHEST FINDINGS  Mild bibasilar atelectasis or scarring is noted. Scattered blebs are noted within  the right upper lobe, and to a lesser extent within the left upper lobe. There is nonspecific minimal peripheral haziness within both lungs, with a ring of peripheral sparing. No pleural effusion or pneumothorax is seen. No mass is identified.  Scattered coronary artery calcification is seen. The mediastinum is otherwise unremarkable. Visualized mediastinal nodes remain normal in size. No pericardial effusion is identified. The great vessels are grossly unremarkable in appearance. The visualized portions of thyroid gland are unremarkable. No axillary lymphadenopathy is seen.  No acute osseous abnormalities are identified.  CT ABDOMEN AND PELVIS FINDINGS  A calcified granuloma is noted within the right hepatic lobe. The liver and spleen are otherwise unremarkable in appearance for. The gallbladder is within normal limits. The pancreas and adrenal glands are unremarkable.  Bilateral renal scarring is noted. Mild nonspecific perinephric stranding is noted bilaterally. The kidneys are otherwise unremarkable. There is no evidence of hydronephrosis. No renal or ureteral stones are seen.  No free fluid is identified. The small bowel is unremarkable in appearance. The stomach is within normal limits. No acute vascular abnormalities are seen. Scattered calcification is noted along the common iliac arteries bilaterally.  The appendix is normal in caliber, without evidence for appendicitis. Contrast progresses to the level of the distal descending colon. The colon is unremarkable in appearance.  The bladder is mildly distended and grossly unremarkable. The prostate remains normal in size. No inguinal lymphadenopathy is seen.  No acute osseous abnormalities are identified.  IMPRESSION:  1. No acute abnormality seen to explain the patient's symptoms. 2. Nonspecific minimal peripheral haziness noted within both lungs, with a ring of peripheral sparing. This is of uncertain significance. Mild bibasilar atelectasis or scarring  noted. Scattered blebs within the upper lobes, more prominent on the right. 3. Scattered coronary artery calcifications seen. 4. Scattered calcification along the common iliac arteries bilaterally.   Electronically Signed   By: Garald Balding M.D.   On: 10/27/2014 00:30   Dg Chest Port 1 View  11/02/2014   CLINICAL DATA:  nausea and vomiting with generalized pain all over body. Pt was discharged from hospital yesterday. Hx HTN, gout, sickle cell anemia  EXAM: PORTABLE CHEST - 1 VIEW  COMPARISON:  10/26/2014  FINDINGS: Lungs are clear. Heart size and mediastinal contours are within normal limits. No effusion. Visualized skeletal structures are unremarkable.  IMPRESSION: No acute cardiopulmonary disease.   Electronically Signed   By: Lucrezia Europe M.D.   On: 11/02/2014 13:04    My personal review of EKG: sinus rhythm.   Assessment & Plan  Principal Problem:   Chest pain, muscular Active Problems:   Loss of weight   Orthostasis   Hypoxia   Diabetes mellitus without complication   Hypertension   Sickle cell trait   Gout   Nausea with vomiting  Fever and muscular chest pain. Extensive work up. WBC still not significantly elevated.  2 sets of blood cultures negative. He was previously given antibiotics (levaquin and Vanc) with no improvement. He was started on steroids for gout.  He did not take his prednisone yet today.  Will continue him on 40 mg prednisone for now. Norco and PRN morphine for pain control.  CRP was 7.8 on 3/10, and Sed rate 61.  Uric Acid not elevated (3.6) From 11/01/14 d/c summary:  - RF- neg - B burg neg - HIV neg - RPR neg - RMSF titers neg ANA, ANCA CCP negative (perfromed twice) - ECHO unrevealing- see report below - have formally asked for ID consult- Dr Baxter Flattery has low suspicious of infectious etiology - I spoke with Dr Neldon Mc - he has faxed over lab results obtained in his office which I have reviewed with Dr Baxter Flattery- ANA is positive but no titer's reported  Sjogrens SSA is 3.8 (0- 0.9) and ESR and CPK are elevated as they are here -discussed with Dr Charlestine Night (rheum) - reviewed lab results with him- he does not feel this is an autoimmune illness  Rash Sx performed skin biopsy during previous stay - report still pending as it was a send out- Dr Dagmar Hait to f/u in office.   Hypoxia Mild 86-87%.  Just completed pulmonary work up with normal PFTs. CXR clear.  Scattered bleds on CT Chest 3/6. Will continue home medications:  Flonase, singulair, claritin Continue prednisone at 40 mg daily. AFB still outstanding.   Pulm follow up in office recommended during last hospital consult.  Orthostasis Possibly from vomiting and fentanyl given in the ER. Will hydrate and request orthostatic vital signs 3/13.  Weight loss. Wife reports he has dropped 27 pounds in 5 weeks unintentionally. Recent CT Abd/Pelvis and chest showed not explanation for symptoms. Nutrition consult.  DM Reduced dose lantus and SSI moderate with meal coverage.  New Nausea vomiting. Possibly viral.  Patient swabbed for influenza and started empirically on tamiflu.  DVT Prophylaxis:  Lovenox  AM Labs Ordered, also please review Full Orders  Family Communication:   Wife at bedside  Code Status:  Full  code  Condition:  Guarded but stable  Time spent in minutes : [address],  PA-C on 11/02/2014 at 5:59 PM  Between 7am to 7pm - Pager - [phone]  After 7pm go to [email or web address] - password [email or web address]  And look for the night coverage person covering me after hours  [facility]

## 2014-11-02 NOTE — Progress Notes (Signed)
Patient wife called in and stated patient was in pain, had taken Tramadol and pain is not controlled. Called DR Butler Denmarkizwan and patient wife advised to take 100 mg of tramadol every 6-8 to see if patient pain improves.

## 2014-11-03 DIAGNOSIS — E119 Type 2 diabetes mellitus without complications: Secondary | ICD-10-CM

## 2014-11-03 DIAGNOSIS — R0789 Other chest pain: Secondary | ICD-10-CM

## 2014-11-03 DIAGNOSIS — R6889 Other general symptoms and signs: Secondary | ICD-10-CM | POA: Insufficient documentation

## 2014-11-03 LAB — COMPREHENSIVE METABOLIC PANEL
ALK PHOS: 54 U/L (ref 39–117)
ALT: 19 U/L (ref 0–53)
AST: 32 U/L (ref 0–37)
Albumin: 2.1 g/dL — ABNORMAL LOW (ref 3.5–5.2)
Anion gap: 6 (ref 5–15)
BILIRUBIN TOTAL: 0.5 mg/dL (ref 0.3–1.2)
BUN: 16 mg/dL (ref 6–23)
CO2: 29 mmol/L (ref 19–32)
CREATININE: 1.34 mg/dL (ref 0.50–1.35)
Calcium: 7.8 mg/dL — ABNORMAL LOW (ref 8.4–10.5)
Chloride: 107 mmol/L (ref 96–112)
GFR calc Af Amer: 64 mL/min — ABNORMAL LOW (ref >90)
GFR calc non Af Amer: 56 mL/min — ABNORMAL LOW (ref >90)
GLUCOSE: 235 mg/dL — AB (ref 70–99)
POTASSIUM: 4.3 mmol/L (ref 3.5–5.1)
Sodium: 142 mmol/L (ref 135–145)
TOTAL PROTEIN: 5 g/dL — AB (ref 6.0–8.3)

## 2014-11-03 LAB — GLUCOSE, CAPILLARY
GLUCOSE-CAPILLARY: 164 mg/dL — AB (ref 70–99)
GLUCOSE-CAPILLARY: 230 mg/dL — AB (ref 70–99)
GLUCOSE-CAPILLARY: 259 mg/dL — AB (ref 70–99)
Glucose-Capillary: 147 mg/dL — ABNORMAL HIGH (ref 70–99)

## 2014-11-03 MED ORDER — PREDNISONE 20 MG PO TABS
40.0000 mg | ORAL_TABLET | Freq: Two times a day (BID) | ORAL | Status: DC
  Administered 2014-11-03 – 2014-11-05 (×4): 40 mg via ORAL
  Filled 2014-11-03 (×4): qty 2

## 2014-11-03 NOTE — Progress Notes (Signed)
PROGRESS NOTE  Derek Anthony ZOX:[id number] DOB: [birth year] DOA: 11/02/2014 PCP: [name], MD  HPI: 59 y.o. male, with a past medical history significant for diabetes, hypertension, GERD, anemia and gout. He was discharged from [facility] yesterday 3/11 after staying for 6 days for a workup of arthralgias, rash, fever and cough, workup essentially negative.   Subjective / 24 H Interval events - feeling weak this morning  Assessment/Plan: Principal Problem:   Chest pain, muscular Active Problems:   Orthostasis   Loss of weight   Hypoxia   Diabetes mellitus without complication   Hypertension   Sickle cell trait   Gout   Nausea with vomiting   Fever   Flu-like symptoms  Polyarthritis with fevers, rash, weight loss - ?autoimmune condition, has elevated CRP and Sed Rate - Dr. Butler Denmark during his prior admission discussed his case with Dr. Kellie Simmering from rheumatology - currently negative studies: ANA, RF, RMSF, HIV, RPR, Hepatitis, Borrelia burgdorferi, ANCA MPO/PR3, TSH, CK - will obtain parvovirus, ENA panel, complement levels. Apparently had a positive ANA as an outpatient but without titer and SSA + - continue steroids, will increase to 40 BID  Rash - mostly on hands/fingers, some psoriatic features, ?psoriasis with psoriatic rash   Hypoxia - Mild 86-87%. Just completed pulmonary work up with normal PFTs. - CXR clear. Scattered bleds on CT Chest 3/6. - Will continue home medications: Flonase, singulair, claritin - Continue prednisone - AFB still outstanding.  - Pulm follow up in office recommended during last hospital consult.  DM  - on Lantus 20 U plus 4U Novolog with meals plus SSI - closely monitor while on steroids     Diet: Diet Carb Modified Fluids: none DVT Prophylaxis: Lovenox  Code Status: Full Code Family Communication: none bedside  Disposition Plan: remain inpatient  Consultants:  None   Procedures:  None     Antibiotics Tamiflu 3/12>>3/13   Studies  Dg Chest Port 1 View  11/02/2014   CLINICAL DATA:  nausea and vomiting with generalized pain all over body. Pt was discharged from hospital yesterday. Hx HTN, gout, sickle cell anemia  EXAM: PORTABLE CHEST - 1 VIEW  COMPARISON:  10/26/2014  FINDINGS: Lungs are clear. Heart size and mediastinal contours are within normal limits. No effusion. Visualized skeletal structures are unremarkable.  IMPRESSION: No acute cardiopulmonary disease.   Electronically Signed   By: Corlis Leak M.D.   On: 11/02/2014 13:04   Objective  Filed Vitals:   11/03/14 0121 11/03/14 0613 11/03/14 0854 11/03/14 0855  BP: 116/62 152/62    Pulse: 65 77    Temp: 100 F (37.8 C) 100.6 F (38.1 C)    TempSrc: Oral Oral    Resp: 16 16    Height:      Weight:      SpO2: 95% 91% 85% 93%    Intake/Output Summary (Last 24 hours) at 11/03/14 1205 Last data filed at 11/03/14 1055  Gross per 24 hour  Intake   1560 ml  Output    725 ml  Net    835 ml   Filed Weights   11/02/14 1043  Weight: 101.152 kg (223 lb)   Exam:  General:  NAD  HEENT: no scleral icterus, MMM, no mouth lesions/ulcers  Cardiovascular: RRR  Respiratory: CTA biL  Abdomen: soft, non tender  MSK/Extremities: rash on fingers/palms, raised, mildly erythematous, scaly appearance  Neuro: non focal   Data Reviewed: Basic Metabolic Panel:  Recent Labs Lab 10/28/14 [phone]  11/02/14 1210 11/03/14 0506  NA 138 144 142  K 3.9 3.9 4.3  CL 108 106 107  CO2 GLUCOSE 153* 144* 235*  BUN CREATININE 1.01 1.32 1.34  CALCIUM 7.7* 8.8 7.8*   Liver Function Tests:  Recent Labs Lab 11/02/14 1210 11/03/14 0506  AST 31 32  ALT 21 19  ALKPHOS 65 54  BILITOT 0.6 0.5  PROT 6.6 5.0*  ALBUMIN 2.8* 2.1*   CBC:  Recent Labs Lab 10/28/14 0658 10/30/14 1212 11/02/14 1210  WBC 5.9 8.9 10.5  NEUTROABS  --  8.0* 8.9*  HGB 10.9* 11.1* 12.1*  HCT 33.0* 34.5* 37.1*  MCV 79.5  79.7 78.3  PLT 189 224 325   Cardiac Enzymes:  Recent Labs Lab 11/02/14 1210  TROPONINI <0.03   CBG:  Recent Labs Lab 11/02/14 1206 11/02/14 1742 11/02/14 2049 11/03/14 0757 11/03/14 1155  GLUCAP 142* 114* 149* 164* 147*    Recent Results (from the past 240 hour(s))  Blood culture (routine x 2)     Status: None   Collection Time: 10/26/14 10:48 PM  Result Value Ref Range Status   Specimen Description BLOOD RIGHT HAND  Final   Special Requests BOTTLES DRAWN AEROBIC AND ANAEROBIC 4CC EACH  Final   Culture   Final    NO GROWTH 5 DAYS Performed at [facility]    Report Status 11/02/2014 FINAL  Final  Blood culture (routine x 2)     Status: None   Collection Time: 10/26/14 10:53 PM  Result Value Ref Range Status   Specimen Description BLOOD LEFT HAND  Final   Special Requests BOTTLES DRAWN AEROBIC AND ANAEROBIC 5CC EACH  Final   Culture   Final    NO GROWTH 5 DAYS Performed at [facility]    Report Status 11/02/2014 FINAL  Final  Culture, blood (routine x 2)     Status: None (Preliminary result)   Collection Time: 10/30/14 12:12 PM  Result Value Ref Range Status   Specimen Description BLOOD LEFT ANTECUBITAL  Final   Special Requests BOTTLES DRAWN AEROBIC AND ANAEROBIC 10CC  Final   Culture   Final           BLOOD CULTURE RECEIVED NO GROWTH TO DATE CULTURE WILL BE HELD FOR 5 DAYS BEFORE ISSUING A FINAL NEGATIVE REPORT Performed at [facility]    Report Status PENDING  Incomplete  Culture, blood (routine x 2)     Status: None (Preliminary result)   Collection Time: 10/30/14 12:24 PM  Result Value Ref Range Status   Specimen Description BLOOD RIGHT WRIST  Final   Special Requests BOTTLES DRAWN AEROBIC AND ANAEROBIC 10CC  Final   Culture   Final           BLOOD CULTURE RECEIVED NO GROWTH TO DATE CULTURE WILL BE HELD FOR 5 DAYS BEFORE ISSUING A FINAL NEGATIVE REPORT Performed at [facility]    Report Status PENDING  Incomplete   Culture, expectorated sputum-assessment     Status: None   Collection Time: 10/31/14  7:22 PM  Result Value Ref Range Status   Specimen Description SPUTUM  Final   Special Requests NONE  Final   Report Status 11/01/2014 FINAL  Final  AFB culture with smear     Status: None (Preliminary result)   Collection Time: 10/31/14  7:22 PM  Result Value Ref Range Status   Specimen Description SPUTUM  Final   Special  Requests NONE  Final   Acid Fast Smear   Final    NO ACID FAST BACILLI SEEN [facility]formed at [facility]    Culture   Final    CULTURE WILL BE EXAMINED FOR 6 WEEKS BEFORE ISSUING A FINAL REPORT Performed at [facility]    Report Status PENDING  Incomplete     Scheduled Meds: . allopurinol  300 mg Oral Daily  . aspirin EC  81 mg Oral Daily  . enoxaparin (LOVENOX) injection  40 mg Subcutaneous Q24H  . famotidine  40 mg Oral Daily  . fluticasone  2 spray Each Nare Daily  . fluticasone  2 puff Inhalation BID  . gabapentin  600 mg Oral BID  . insulin aspart  0-15 Units Subcutaneous TID WC  . insulin aspart  4 Units Subcutaneous TID WC  . insulin glargine  20 Units Subcutaneous q morning - 10a  . irbesartan  300 mg Oral Daily  . loratadine  10 mg Oral Daily  . montelukast  10 mg Oral QHS  . predniSONE  40 mg Oral Q breakfast   Continuous Infusions: . sodium chloride 100 mL/hr at 11/03/14 0725    Pamella Pertostin Jordayn Mink, MD [facility] Pager [phone]. If 7 PM - 7 AM, please contact night-coverage at [email or web address], password [email or web address] 11/03/2014, 12:05 PM  LOS: 1 day

## 2014-11-03 NOTE — Plan of Care (Signed)
Problem: Phase I Progression Outcomes Goal: Pain controlled with appropriate interventions Outcome: Progressing Has been medicated today.

## 2014-11-03 NOTE — Progress Notes (Signed)
UR Completed.  [phone]  

## 2014-11-03 NOTE — Progress Notes (Signed)
Patient had a fever this morning of 100.6, and was complaining of pain.  Gave Norco.  Continue to monitor.

## 2014-11-03 NOTE — Evaluation (Signed)
Physical Therapy Evaluation Patient Details Name: Derek Anthony MRN: [id number] DOB: July 12, 1956 Today's Date: 11/03/2014   History of Present Illness  Pt is a 59 y.o. male, with a PMH significant for diabetes, hypertension, GERD, anemia and gout. [facility]  3/11 after staying for 6 days for a workup of arthralgias, rash, fever and cough.He was seen by both infectious disease and pulmonary and had an extensive workup that is outlined in Dr. Karlyne Anthony's discharge summary of 3/11.Derek Anthony went home on 3/11 and felt fairly well in the evening until after dinner, when he began to feel severely fatigued. In the morning he got up having uncontrolled arthralgias particularly in his pectoral muscles and bilateral shoulders. He tried to use tramadol but did not receive relief. He went to urgent care and vomited in the parking lot. The urgent care physician [facility]. In the ER parking lot he once again vomited.  He continues to have a low grade fever (100.4). His oxygen sat at rest varied between 86 - 87%. Orthostatic vital signs were positive (119/63 lying, 93/61 standing). Pt was readmitted for orthostatic hypotension, hypoxia, weakness, and pain control.  Clinical Impression  Pt admitted with above diagnosis. Pt currently with functional limitations due to the deficits listed below (see PT Problem List). At the time of PT eval pt was able to perform transfers and ambulation with close min guard assist and no AD. Pt unsteady throughout session however did not require assistance from therapist to recover. Will keep on PT caseload while inpatient to improve balance and tolerance for functional activity prior to d/c. Pt will benefit from skilled PT to increase their independence and safety with mobility to allow discharge to the venue listed below. Do not anticipate that pt will require PT follow-up.   While ambulating on RA O2 sats decreased to 77%. Pt mildly  dyspneic at end of gait training. Upon seated rest break increased to 84% with PLB prior to donning supplemental O2. At end of session sats at 93% and RN was notified.     Follow Up Recommendations No PT follow up;Supervision for mobility/OOB    Equipment Recommendations  Cane (Depending on progress with PT)    Recommendations for Other Services       Precautions / Restrictions Precautions Precautions: Fall Precaution Comments: Watch O2 sats Restrictions Weight Bearing Restrictions: No      Mobility  Bed Mobility Overal bed mobility: Modified Independent             General bed mobility comments: No assist required. Minimal bed rail use however do not feel pt required it.  Transfers Overall transfer level: Needs assistance Equipment used: None Transfers: Sit to/from Stand Sit to Stand: Supervision         General transfer comment: Supervision for safety as pt somewhat unsteady upon initial standing. Pt denies dizziness.   Ambulation/Gait Ambulation/Gait assistance: Min guard Ambulation Distance (Feet): 200 Feet Assistive device: None Gait Pattern/deviations: Step-through pattern;Decreased stride length;Staggering left;Staggering right Gait velocity: Decreased Gait velocity interpretation: Below normal speed for age/gender General Gait Details: Pt was able to ambulate with occasional side step/stagger to R or L. Close guard was provided during these times however no physical assist was required to recover. Pt ambulated on RA and sats dropped to 77%.   Stairs            Wheelchair Mobility    Modified Rankin (Stroke Patients Only)  Balance Overall balance assessment: Needs assistance Sitting-balance support: Feet supported;No upper extremity supported Sitting balance-Leahy Scale: Fair     Standing balance support: No upper extremity supported;During functional activity Standing balance-Leahy Scale: Fair Standing balance comment: Unsteady  however pt could recover independently.                              Pertinent Vitals/Pain Pain Assessment: Faces Faces Pain Scale: Hurts little more Pain Location: Shoulders, back, LE's Pain Descriptors / Indicators: Discomfort;Grimacing Pain Intervention(s): Limited activity within patient's tolerance;Monitored during session;Repositioned    Home Living Family/patient expects to be discharged to:: Private residence Living Arrangements: Spouse/significant other Available Help at Discharge: Family;Available 24 hours/day Type of Home: House Home Access: Level entry     Home Layout: Two level Home Equipment: None      Prior Function Level of Independence: Independent               Hand Dominance   Dominant Hand: Right    Extremity/Trunk Assessment   Upper Extremity Assessment: Defer to OT evaluation           Lower Extremity Assessment: Overall WFL for tasks assessed      Cervical / Trunk Assessment: Normal  Communication   Communication: No difficulties  Cognition Arousal/Alertness: Awake/alert Behavior During Therapy: Flat affect Overall Cognitive Status: Within Functional Limits for tasks assessed                      General Comments      Exercises        Assessment/Plan    PT Assessment Patient needs continued PT services  PT Diagnosis Difficulty walking   PT Problem List Decreased strength;Decreased range of motion;Decreased activity tolerance;Decreased balance;Decreased mobility;Decreased knowledge of use of DME;Decreased safety awareness;Decreased knowledge of precautions;Pain  PT Treatment Interventions DME instruction;Gait training;Stair training;Functional mobility training;Therapeutic activities;Therapeutic exercise;Neuromuscular re-education;Patient/family education   PT Goals (Current goals can be found in the Care Plan section) Acute Rehab PT Goals Patient Stated Goal: Figure out what is going on with his medical  status.  PT Goal Formulation: With patient Time For Goal Achievement: 11/10/14 Potential to Achieve Goals: Good    Frequency Min 3X/week   Barriers to discharge        Co-evaluation               End of Session Equipment Utilized During Treatment: Gait belt Activity Tolerance: Patient tolerated treatment well Patient left: in chair;with call Silliman/phone within reach Nurse Communication: Mobility status;Other (comment) (O2 status)         Time: 1191-4782 PT Time Calculation (min) (ACUTE ONLY): 22 min   Charges:   PT Evaluation $Initial PT Evaluation Tier I: 1 Procedure     PT G Codes:        Conni Slipper 11/17/14, 2:55 PM  Conni Slipper, PT, DPT Acute Rehabilitation Services Pager: [phone]

## 2014-11-04 DIAGNOSIS — R634 Abnormal weight loss: Secondary | ICD-10-CM

## 2014-11-04 LAB — EXTRACTABLE NUCLEAR ANTIGEN ANTIBODY
ENA SM Ab Ser-aCnc: <1
SM/RNP: NEGATIVE
SSA (Ro) (ENA) Antibody, IgG: 2.6
SSB (LA) (ENA) ANTIBODY, IGG: NEGATIVE
Scleroderma (Scl-70) (ENA) Antibody, IgG: <1
ds DNA Ab: 1 IU/mL

## 2014-11-04 LAB — COMPREHENSIVE METABOLIC PANEL
ALBUMIN: 2.2 g/dL — AB (ref 3.5–5.2)
ALT: 21 U/L (ref 0–53)
ANION GAP: 5 (ref 5–15)
AST: 32 U/L (ref 0–37)
Alkaline Phosphatase: 58 U/L (ref 39–117)
BUN: 16 mg/dL (ref 6–23)
CO2: 28 mmol/L (ref 19–32)
CREATININE: 1.02 mg/dL (ref 0.50–1.35)
Calcium: 8.4 mg/dL (ref 8.4–10.5)
Chloride: 104 mmol/L (ref 96–112)
GFR calc Af Amer: 90 mL/min — ABNORMAL LOW (ref >90)
GFR calc non Af Amer: 77 mL/min — ABNORMAL LOW (ref >90)
Glucose, Bld: 364 mg/dL — ABNORMAL HIGH (ref 70–99)
Potassium: 5 mmol/L (ref 3.5–5.1)
Sodium: 137 mmol/L (ref 135–145)
TOTAL PROTEIN: 5.6 g/dL — AB (ref 6.0–8.3)
Total Bilirubin: 0.5 mg/dL (ref 0.3–1.2)

## 2014-11-04 LAB — CBC
HCT: 34.1 % — ABNORMAL LOW (ref 39.0–52.0)
Hemoglobin: 11.4 g/dL — ABNORMAL LOW (ref 13.0–17.0)
MCH: 26.3 pg (ref 26.0–34.0)
MCHC: 33.4 g/dL (ref 30.0–36.0)
MCV: 78.6 fL (ref 78.0–100.0)
PLATELETS: 236 10*3/uL (ref 150–400)
RBC: 4.34 MIL/uL (ref 4.22–5.81)
RDW: 13.8 % (ref 11.5–15.5)
WBC: 7.3 10*3/uL (ref 4.0–10.5)

## 2014-11-04 LAB — MAGNESIUM: Magnesium: 2 mg/dL (ref 1.5–2.5)

## 2014-11-04 LAB — GLUCOSE, CAPILLARY
GLUCOSE-CAPILLARY: 318 mg/dL — AB (ref 70–99)
Glucose-Capillary: 317 mg/dL — ABNORMAL HIGH (ref 70–99)
Glucose-Capillary: 321 mg/dL — ABNORMAL HIGH (ref 70–99)
Glucose-Capillary: 278 mg/dL — ABNORMAL HIGH (ref 70–99)

## 2014-11-04 LAB — COMPLEMENT, TOTAL: COMPL TOTAL (CH50): 48 U/mL (ref 42–60)

## 2014-11-04 MED ORDER — INSULIN GLARGINE 100 UNIT/ML ~~LOC~~ SOLN
30.0000 [IU] | Freq: Two times a day (BID) | SUBCUTANEOUS | Status: DC
  Administered 2014-11-04: 30 [IU] via SUBCUTANEOUS
  Filled 2014-11-04 (×3): qty 0.3

## 2014-11-04 MED ORDER — MELOXICAM 7.5 MG PO TABS
7.5000 mg | ORAL_TABLET | Freq: Every day | ORAL | Status: DC
  Administered 2014-11-04 – 2014-11-05 (×2): 7.5 mg via ORAL
  Filled 2014-11-04 (×2): qty 1

## 2014-11-04 MED ORDER — GLUCERNA SHAKE PO LIQD
237.0000 mL | Freq: Three times a day (TID) | ORAL | Status: DC
  Administered 2014-11-04 – 2014-11-05 (×3): 237 mL via ORAL

## 2014-11-04 MED ORDER — LORAZEPAM 1 MG PO TABS: 1.0000 mg | ORAL_TABLET | Freq: Once | ORAL | Status: AC | PRN

## 2014-11-04 MED ORDER — INSULIN ASPART 100 UNIT/ML ~~LOC~~ SOLN
8.0000 [IU] | Freq: Three times a day (TID) | SUBCUTANEOUS | Status: DC
  Administered 2014-11-04 – 2014-11-05 (×2): 8 [IU] via SUBCUTANEOUS

## 2014-11-04 MED ORDER — HYDRALAZINE HCL 20 MG/ML IJ SOLN
10.0000 mg | Freq: Four times a day (QID) | INTRAMUSCULAR | Status: DC | PRN
  Administered 2014-11-04: 10 mg via INTRAVENOUS
  Filled 2014-11-04: qty 1

## 2014-11-04 NOTE — Progress Notes (Signed)
Inpatient Diabetes Program Recommendations  AACE/ADA: New Consensus Statement on Inpatient Glycemic Control (2013)  Target Ranges:  Prepandial:   less than 140 mg/dL      Peak postprandial:   less than 180 mg/dL (1-2 hours)      Critically ill patients:  140 - 180 mg/dL     Results for Derek Anthony, Derek Anthony (MRN [id number]) as of 11/04/2014 09:19  Ref. Range 11/03/2014 07:57 11/03/2014 11:55 11/03/2014 17:08 11/03/2014 21:34  Glucose-Capillary Latest Range: 70-99 mg/dL 865164 (H) 784147 (H) 696230 (H) 259 (H)    Results for Derek Anthony, Derek Anthony (MRN [id number]) as of 11/04/2014 09:19  Ref. Range 11/04/2014 07:26  Glucose-Capillary Latest Range: 70-99 mg/dL 440317 (H)     Chief Complaint: Fever/ Arthralgias  History: DM, HTN  Home DM Meds: Lantus 20 units daily         70/30 insulin- 30 units AM/ 20 units PM  Current DM Orders: Lantus 20 units QAM    Novolog Moderate SSI    Novolog 4 units tidwc   **Patient currently getting Prednisone 40 mg bid.  **Note that patient normally takes Lantus and 70/30 insulin at home.  **Fasting glucose elevated this AM.    MD- Please consider the following in-hospital insulin adjustments while patient getting PO steroids:  1. Increase Lantus to 25 units daily  2. Increase Novolog Meal Coverage to Novolog 6 units tid with meals     Will follow [name] RN, MSN, CDE Diabetes Coordinator Inpatient Diabetes Program Team Pager: [phone] (8a-5p)

## 2014-11-04 NOTE — Progress Notes (Signed)
Pt BP was 173/63, 163/77, 175/64 Dr. Claiborne Billingsallahan informed and ordered hydralazine IV, pt alert and responsive asymptomatic will continue to monitor.

## 2014-11-04 NOTE — Progress Notes (Signed)
PROGRESS NOTE  Derek Anthony UJW:[id number] DOB: 1956/04/21 DOA: 11/02/2014 PCP: Hoyle SauerAVVA,RAVISANKAR R, MD  HPI: 59 y.o. male, with a past medical history significant for diabetes, hypertension, GERD, anemia and gout. [facility]11 after staying for 6 days for a workup of arthralgias, rash, fever and cough, workup essentially negative.   Subjective / 24 H Interval events Slightly better  Assessment/Plan: Extensive chart review  Polyarthritis with fevers, rash, weight loss Workup to date negative. Has LBP and point tenderness. Will get MRI L spine r/o discitis/osteomyelitis. Has rash on PIPs, DIPs, nail changes. ?dermatomyositis? Check anti ro, anti la, anti RNP. Continue prednisone. Add mobic.  D/w Dr. Felipa EthAvva. [facility]. Will d/c tomorrow with close outpt derm and rheum f/u.Marland Kitchen.  DM  Uncontrolled secondary to steroids. Increase lantus, SSI and meal coverage    Diet: Diet Carb Modified Fluids: none DVT Prophylaxis: Lovenox  Code Status: Full Code Family Communication: wife and daughter at bedside  Disposition Plan: home tomorrow if stable  Consultants:  None   Procedures:  None    Antibiotics Tamiflu 3/12>>3/13   Studies  No results found. Objective  Filed Vitals:   11/03/14 2134 11/03/14 2310 11/04/14 0645 11/04/14 0718  BP:  163/77 172/87 172/87  Pulse:  54 109   Temp:   98.6 F (37 C)   TempSrc:      Resp: 16  16   Height:      Weight:      SpO2: 96%  96%     Intake/Output Summary (Last 24 hours) at 11/04/14 1256 Last data filed at 11/04/14 0910  Gross per 24 hour  Intake   1211 ml  Output   1700 ml  Net   -489 ml   Filed Weights   11/02/14 1043  Weight: 101.152 kg (223 lb)   Exam:  General:  NAD  HEENT: no scleral icterus, MMM, no mouth lesions/ulcers  Cardiovascular: RRR  Respiratory: CTA biL  Abdomen: soft, non tender  MSK/Extremities: rash on  fingers/palms, raised, mildly erythematous, scaly appearance  Neuro: non focal   Data Reviewed: Basic Metabolic Panel:  Recent Labs Lab 11/02/14 1210 11/03/14 0506 11/04/14 0543  NA 144 142 137  K 3.9 4.3 5.0  CL 106 107 104  CO2 27 29 28   GLUCOSE 144* 235* 364*  BUN 18 16 16   CREATININE 1.32 1.34 1.02  CALCIUM 8.8 7.8* 8.4  MG  --   --  2.0   Liver Function Tests:  Recent Labs Lab 11/02/14 1210 11/03/14 0506 11/04/14 0543  AST 31 32 32  ALT 21 19 21   ALKPHOS 65 54 58  BILITOT 0.6 0.5 0.5  PROT 6.6 5.0* 5.6*  ALBUMIN 2.8* 2.1* 2.2*   CBC:  Recent Labs Lab 10/30/14 1212 11/02/14 1210 11/04/14 0543  WBC 8.9 10.5 7.3  NEUTROABS 8.0* 8.9*  --   HGB 11.1* 12.1* 11.4*  HCT 34.5* 37.1* 34.1*  MCV 79.7 78.3 78.6  PLT 224 325 236   Cardiac Enzymes:  Recent Labs Lab 11/02/14 1210  TROPONINI <0.03   CBG:  Recent Labs Lab 11/03/14 1155 11/03/14 1708 11/03/14 2134 11/04/14 0726 11/04/14 1135  GLUCAP 147* 230* 259* 317* 318*    Recent Results (from the past 240 hour(s))  Blood culture (routine x 2)     Status: None   Collection Time: 10/26/14 10:48 PM  Result Value Ref Range Status   Specimen Description BLOOD RIGHT HAND  Final   Special Requests BOTTLES DRAWN AEROBIC AND ANAEROBIC 4CC EACH  Final   Culture   Final    NO GROWTH 5 DAYS Performed at [facility]    Report Status 11/02/2014 FINAL  Final  Blood culture (routine x 2)     Status: None   Collection Time: 10/26/14 10:53 PM  Result Value Ref Range Status   Specimen Description BLOOD LEFT HAND  Final   Special Requests BOTTLES DRAWN AEROBIC AND ANAEROBIC 5CC EACH  Final   Culture   Final    NO GROWTH 5 DAYS Performed at [facility]    Report Status 11/02/2014 FINAL  Final  Culture, blood (routine x 2)     Status: None (Preliminary result)   Collection Time: 10/30/14 12:12 PM  Result Value Ref Range Status   Specimen Description BLOOD LEFT ANTECUBITAL  Final    Special Requests BOTTLES DRAWN AEROBIC AND ANAEROBIC 10CC  Final   Culture   Final           BLOOD CULTURE RECEIVED NO GROWTH TO DATE CULTURE WILL BE HELD FOR 5 DAYS BEFORE ISSUING A FINAL NEGATIVE REPORT Performed at [facility]    Report Status PENDING  Incomplete  Culture, blood (routine x 2)     Status: None (Preliminary result)   Collection Time: 10/30/14 12:24 PM  Result Value Ref Range Status   Specimen Description BLOOD RIGHT WRIST  Final   Special Requests BOTTLES DRAWN AEROBIC AND ANAEROBIC 10CC  Final   Culture   Final           BLOOD CULTURE RECEIVED NO GROWTH TO DATE CULTURE WILL BE HELD FOR 5 DAYS BEFORE ISSUING A FINAL NEGATIVE REPORT Performed at [facility]    Report Status PENDING  Incomplete  Culture, expectorated sputum-assessment     Status: None   Collection Time: 10/31/14  7:22 PM  Result Value Ref Range Status   Specimen Description SPUTUM  Final   Special Requests NONE  Final   Report Status 11/01/2014 FINAL  Final  AFB culture with smear     Status: None (Preliminary result)   Collection Time: 10/31/14  7:22 PM  Result Value Ref Range Status   Specimen Description SPUTUM  Final   Special Requests NONE  Final   Acid Fast Smear   Final    NO ACID FAST BACILLI SEEN Performed at [facility]    Culture   Final    CULTURE WILL BE EXAMINED FOR 6 WEEKS BEFORE ISSUING A FINAL REPORT Performed at [facility]    Report Status PENDING  Incomplete     Scheduled Meds: . allopurinol  300 mg Oral Daily  . aspirin EC  81 mg Oral Daily  . enoxaparin (LOVENOX) injection  40 mg Subcutaneous Q24H  . famotidine  40 mg Oral Daily  . fluticasone  2 spray Each Nare Daily  . fluticasone  2 puff Inhalation BID  . gabapentin  600 mg Oral BID  . insulin aspart  0-15 Units Subcutaneous TID WC  . insulin aspart  4 Units Subcutaneous TID WC  . insulin glargine  20 Units Subcutaneous q morning - 10a  . irbesartan  300 mg Oral Daily  .  loratadine  10 mg Oral Daily  . meloxicam  7.5 mg Oral Daily  . montelukast  10 mg Oral QHS  . predniSONE  40 mg Oral BID WC   Continuous Infusions: . sodium chloride 10  mL/hr at 11/03/14 1314    Crista Curb, MD [email or web address], password [facility] 11/04/2014, 12:56 PM  LOS: 2 days

## 2014-11-04 NOTE — Progress Notes (Signed)
Physical Therapy Treatment Patient Details Name: Derek Anthony MRN: [id number] DOB: 1955/12/11 Today's Date: 11/04/2014    History of Present Illness Pt is a 59 y.o. male, with a PMH significant for diabetes, hypertension, GERD, anemia and gout. He was discharged from [facility] [place]  3/11 after staying for 6 days for a workup of arthralgias, rash, fever and cough.He was seen by both infectious disease and pulmonary and had an extensive workup that is outlined in Dr. Karlyne Greenspan discharge summary of 3/11.Pt was readmitted for orthostatic hypotension, hypoxia, weakness, and pain control.    PT Comments    Patient supine on 3L [place] O2 with SaO2 97% on arrival. and removed O2.  Upon sitting EOB, SaO2 94% on 3L  On room air at rest 91-92% Walking 240 ft on room air 89-90% At rest sitting upright on room air 91-92% (RN requested resume 3L O2 and place [place] on pt)  Pt reports he did not use home O2 prior to admission. Remains slightly weak and unsteady on his feet. Will trial straight cane to determine if this gives him enough support to improve his safety.   Follow Up Recommendations  No PT follow up;Supervision for mobility/OOB     Equipment Recommendations  Cane (Depending on progress with PT)    Recommendations for Other Services       Precautions / Restrictions Precautions Precautions: Fall Precaution Comments: Watch O2 sats Restrictions Weight Bearing Restrictions: No    Mobility  Bed Mobility Overal bed mobility: Modified Independent             General bed mobility comments: No assist required. Minimal bed rail use however do not feel pt required it.  Transfers Overall transfer level: Needs assistance Equipment used: None Transfers: Sit to/from Stand Sit to Stand: Supervision         General transfer comment: Supervision for safety as pt somewhat unsteady upon initial standing. Pt denies dizziness.   Ambulation/Gait Ambulation/Gait assistance: Min  guard Ambulation Distance (Feet): 240 Feet (laps in pt room due to droplet precautions) Assistive device: None (vs IV pole) Gait Pattern/deviations: Step-through pattern;Decreased stride length;Drifts right/left Gait velocity: Decreased Gait velocity interpretation: Below normal speed for age/gender General Gait Details: Pt ambulated in room due to precautions. Slightly unsteady however no overt loss of balance. Pt reports his balance varies depending on level of fatigue. Pt is interested in trying a straight cane.    Stairs            Wheelchair Mobility    Modified Rankin (Stroke Patients Only)       Balance     Sitting balance-Leahy Scale: Normal       Standing balance-Leahy Scale: Fair                      Cognition Arousal/Alertness: Awake/alert Behavior During Therapy: Flat affect Overall Cognitive Status: Within Functional Limits for tasks assessed                      Exercises      General Comments General comments (skin integrity, edema, etc.): wife present      Pertinent Vitals/Pain  (see above)    Home Living                      Prior Function            PT Goals (current goals can now be found in the care plan section)  Acute Rehab PT Goals Patient Stated Goal: Figure out what is going on with his medical status.  Progress towards PT goals: Progressing toward goals    Frequency  Min 3X/week    PT Plan Current plan remains appropriate    Co-evaluation             End of Session   Activity Tolerance: Patient tolerated treatment well Patient left: in chair;with call [place]/phone within reach;with family/visitor present     Time: 1535-1559 PT Time Calculation (min) (ACUTE ONLY): 24 min  Charges:  $Gait Training: 23-37 mins                    G Codes:      Irys Nigh 11/04/2014, 4:14 PM Pager [phone]

## 2014-11-04 NOTE — Progress Notes (Signed)
INITIAL NUTRITION ASSESSMENT  DOCUMENTATION CODES Per approved criteria  -Not Applicable   INTERVENTION: -Glucerna Shake po TID, each supplement provides 220 kcal and 10 grams of protein -Provide high protein nourishments between meals per pt and family requests  NUTRITION DIAGNOSIS: Inadequate oral intake related to decreased appetite as evidenced by diet hx, 13.3% wt loss x 6 months.   Goal: Pt will meet >90% of estimated nutritional needs  Monitor:  PO/supplement intake, labs, weight changes, I/O's  Reason for Assessment: MD consult to assess nutritional needs and status  59 y.o. male  Admitting Dx: Chest pain, muscular  Derek Anthony is a 58 y.o. male, with a past medical history significant for diabetes, hypertension, GERD, anemia and gout. He was discharged from [facility] yesterday 3/11 after staying for 6 days for a workup of arthralgias, rash, fever and cough. He was seen by both infectious disease and pulmonary and had an extensive workup that is outlined in Dr. Karlyne Greenspan discharge summary of 3/11. A skin biopsy was done and he was to be discharged home on prednisone and closely followed by his primary care physician. Derek Anthony went home on 3/11 and felt fairly well in the evening until after dinner, when he began to feel severely fatigued.  ASSESSMENT: Pt familiar to this RD due to previous admission from last week.  He reports a general decline in health since September 2015. He has experienced poor appetite and weight loss, which has exacerbated over the past 4 weeks. He estimates he has lost 35# (13.3%) in the past 6 months. Pt reports that his appetite continues to be poor since last admission. He tolerated breakfast this morning- he consumed 1/2 of his oatmeal, 1 slice of french toast, and bacon for breakfast. He reveals that his painful swallowing present during last admission has improved and has no difficulty chewing or swallowing foods and liquids.  Pt  reports that he really enjoyed Glucerna shakes last admission and requests continuing supplement. Pt wife also requested high protein snacks between meals (ice cream) due to pt's poor appetite- RD to order. Also provided encouragement for pt to consume meals, snacks and supplements. Discussed importance of good PO intake to promote healing.  Labs reviewed. Glucose: 364, CBGS: 259-218. Noted elevated glucose due to steroid use. DM coordinator following and adjusting insulin regimen.  Nutrition-focused physical exam revealed no signs of fat or muscle depletion.  Also discussed findings with MD after assessment, who is agreeable with plan of care.  Height: Ht Readings from Last 1 Encounters:  11/02/14 6' 1.5" (1.867 m)    Weight: Wt Readings from Last 1 Encounters:  11/02/14 223 lb (101.152 kg)    Ideal Body Weight: 187#  % Ideal Body Weight: 119%  Wt Readings from Last 10 Encounters:  11/02/14 223 lb (101.152 kg)  11/01/14 227 lb 4.7 oz (103.1 kg)  05/21/13 255 lb (115.667 kg)    Usual Body Weight: 255#  % Usual Body Weight: 87%  BMI:  Body mass index is 29.02 kg/(m^2). Overweight  Estimated Nutritional Needs: Kcal: 2100-2300 Protein: 100-110 grams Fluid: 2.1-2.3 L  Skin: Intact  Diet Order: Diet Carb Modified  EDUCATION NEEDS: -Education needs addressed   Intake/Output Summary (Last 24 hours) at 11/04/14 1219 Last data filed at 11/04/14 0910  Gross per 24 hour  Intake   1211 ml  Output   1700 ml  Net   -489 ml    Last BM: PTA  Labs:   Recent Labs Lab 11/02/14 1210  11/03/14 0506 11/04/14 0543  NA 144 142 137  K 3.9 4.3 5.0  CL 106 107 104  CO2 27 29 28   BUN 18 16 16   CREATININE 1.32 1.34 1.02  CALCIUM 8.8 7.8* 8.4  MG  --   --  2.0  GLUCOSE 144* 235* 364*    CBG (last 3)   Recent Labs  11/03/14 2134 11/04/14 0726 11/04/14 1135  GLUCAP 259* 317* 318*    Scheduled Meds: . allopurinol  300 mg Oral Daily  . aspirin EC  81 mg Oral Daily   . enoxaparin (LOVENOX) injection  40 mg Subcutaneous Q24H  . famotidine  40 mg Oral Daily  . fluticasone  2 spray Each Nare Daily  . fluticasone  2 puff Inhalation BID  . gabapentin  600 mg Oral BID  . insulin aspart  0-15 Units Subcutaneous TID WC  . insulin aspart  4 Units Subcutaneous TID WC  . insulin glargine  20 Units Subcutaneous q morning - 10a  . irbesartan  300 mg Oral Daily  . loratadine  10 mg Oral Daily  . montelukast  10 mg Oral QHS  . predniSONE  40 mg Oral BID WC    Continuous Infusions: . sodium chloride 10 mL/hr at 11/03/14 1314    Past Medical History  Diagnosis Date  . Diabetes mellitus without complication   . Hypertension   . GERD (gastroesophageal reflux disease)   . Anemia   . Sickle cell trait   . Gout     Past Surgical History  Procedure Laterality Date  . Shoulder surgery    . Knee reconstruction Right   . Vasectomy      Sargon Scouten A. Mayford KnifeWilliams, RD, LDN, CDE Pager: [phone] After hours Pager: [phone]

## 2014-11-05 ENCOUNTER — Inpatient Hospital Stay (HOSPITAL_COMMUNITY): Payer: 59

## 2014-11-05 DIAGNOSIS — R21 Rash and other nonspecific skin eruption: Secondary | ICD-10-CM

## 2014-11-05 LAB — CULTURE, BLOOD (ROUTINE X 2)
Culture: NO GROWTH
Culture: NO GROWTH

## 2014-11-05 LAB — JO-1 ANTIBODY-IGG: JO-1 ANTIBODY, IGG: NEGATIVE

## 2014-11-05 LAB — ANTI-SMITH ANTIBODY: ENA SM AB SER-ACNC: NEGATIVE

## 2014-11-05 LAB — QUANTIFERON IN TUBE
QFT TB AG MINUS NIL VALUE: 0.01 [IU]/mL
QUANTIFERON MITOGEN VALUE: 0.03 IU/mL
QUANTIFERON NIL VALUE: 0.02 [IU]/mL
QUANTIFERON TB AG VALUE: 0.03 [IU]/mL
QUANTIFERON TB GOLD: UNDETERMINED

## 2014-11-05 LAB — QUANTIFERON TB GOLD ASSAY (BLOOD)

## 2014-11-05 LAB — SJOGRENS SYNDROME-A EXTRACTABLE NUCLEAR ANTIBODY: SSA (Ro) (ENA) Antibody, IgG: 2.8

## 2014-11-05 LAB — GLUCOSE, CAPILLARY
Glucose-Capillary: 325 mg/dL — ABNORMAL HIGH (ref 70–99)
Glucose-Capillary: 330 mg/dL — ABNORMAL HIGH (ref 70–99)

## 2014-11-05 LAB — ANTI-RIBONUCLEIC ACID ANTIBODY: SM/RNP: NEGATIVE

## 2014-11-05 LAB — SJOGRENS SYNDROME-B EXTRACTABLE NUCLEAR ANTIBODY: SSB (LA) (ENA) ANTIBODY, IGG: NEGATIVE

## 2014-11-05 MED ORDER — MELOXICAM 7.5 MG PO TABS: 7.5000 mg | ORAL_TABLET | Freq: Every day | ORAL | Status: DC

## 2014-11-05 MED ORDER — HYDROCODONE-ACETAMINOPHEN 5-325 MG PO TABS: 1.0000 | ORAL_TABLET | Freq: Four times a day (QID) | ORAL | Status: DC | PRN

## 2014-11-05 MED ORDER — INSULIN ASPART 100 UNIT/ML ~~LOC~~ SOLN
12.0000 [IU] | Freq: Three times a day (TID) | SUBCUTANEOUS | Status: DC
  Administered 2014-11-05: 12 [IU] via SUBCUTANEOUS

## 2014-11-05 MED ORDER — PREDNISONE 20 MG PO TABS: 40.0000 mg | ORAL_TABLET | Freq: Every day | ORAL | Status: DC

## 2014-11-05 MED ORDER — INSULIN GLARGINE 100 UNIT/ML ~~LOC~~ SOLN
40.0000 [IU] | Freq: Two times a day (BID) | SUBCUTANEOUS | Status: DC
  Administered 2014-11-05: 40 [IU] via SUBCUTANEOUS
  Filled 2014-11-05 (×2): qty 0.4

## 2014-11-05 MED ORDER — MELOXICAM 7.5 MG PO TABS: 7.5000 mg | ORAL_TABLET | Freq: Every day | ORAL | Status: AC

## 2014-11-05 MED ORDER — GADOBENATE DIMEGLUMINE 529 MG/ML IV SOLN
20.0000 mL | Freq: Once | INTRAVENOUS | Status: AC | PRN
Start: 2014-11-05 — End: 2014-11-05
  Administered 2014-11-05: 20 mL via INTRAVENOUS

## 2014-11-05 MED ORDER — INSULIN GLARGINE 100 UNIT/ML ~~LOC~~ SOLN: 40.0000 [IU] | Freq: Every day | SUBCUTANEOUS | Status: AC

## 2014-11-05 NOTE — Progress Notes (Signed)
To whom it may concern:   Derek Anthony has been hospitalized from 10/26/14 - 11/05/14   Sincerely,    Crista Curborinna Nero Sawatzky, MD [facility]

## 2014-11-05 NOTE — Discharge Summary (Addendum)
Physician Discharge Summary  Derek Anthony:[id number] DOB: Apr 16, 1956 DOA: 11/02/2014  PCP: [name], MD  Admit date: 11/02/2014 Discharge date: 11/05/2014  Time spent: greater than 30 minutes  Recommendations for Outpatient Follow-up:  1. Dermatology f/u tomorrow 2. Rheumatology f/u to be arranged by Dr. Felipa Eth 3. F/u serum porphyrins, anti ro, anti la, anti RNP, anti smith antibodies  Discharge Diagnoses:  Principal Problem:   Fever, pain, weight loss, rash, suspect rheumatologic disorder, ?dermatomyositis? Active Problems:   Orthostasis   Loss of weight   Hypoxia, resolved   Diabetes mellitus uncontrolled without complications   Hypertension   Sickle cell trait   Gout   Nausea with vomiting   Discharge Condition: stable  Diet recommendation: diabetic  Filed Weights   11/02/14 1043  Weight: 101.152 kg (223 lb)    History of present illness:  59 y.o. male, with a past medical history significant for diabetes, hypertension, GERD, anemia and gout. He was discharged from [facility] yesterday 3/11 after staying for 6 days for a workup of arthralgias, rash, fever and cough. He was seen by both infectious disease and pulmonary and had an extensive workup that is outlined in Dr. Karlyne Greenspan discharge summary of 3/11. A skin biopsy was done and he was to be discharged home on prednisone and closely followed by his primary care physician. Mr. Derek Anthony went home on 3/11 and felt fairly well in the evening until after dinner, when he began to feel severely fatigued. In the morning he got up having uncontrolled arthralgias particularly in his pectoral muscles and bilateral shoulders. He tried to use tramadol but did not receive relief. He went to urgent care and vomited in the parking lot. The urgent care physician directed him to [facility]. In the ER parking lot he once again vomited. Labs in the ER today were unrevealing and CXR was clear. He continues to have a low  grade fever (100.4). His wife was willing to take him home if we could control his pain. Because he appeared particularly weak and fatigued - ambulating oxygen sats and orthostatic vital signs were ordered. His oxygen sat at rest varied between 86 - 87%. Orthostatic vital signs were positive (119/63 lying, 93/61 standing). We will readmit him today for orthostatic hypotension, hypoxia, weakness, and pain control.  Hospital Course:  Started on prednisone, mobic, opiate analgesics prn.  Fever workup again negative.  Anti ro, anti la, serum porphyrins drawn and pending. Had point tenderness low back along lumbar spine. MRI negative for infection or acute pathology. Fevers, pain improved on mobic and prednisone. Discussed with PCP, Dr. Felipa Eth. I suspect this is rheumatologic. Noted to have red plaques on PIPs, DIPs, (Gottrens papules?), nail changes and faint purple area left periorbital area. I wonder if this could be dermatomyositis. Has derm appointment tomorrow.  Punch biopsy done on rash previous admission nonspecific.  Dr. Felipa Eth will arrange rheumatologic f/u  Patient's diabetes medications adjusted, as cbgs high while on prednisone.  Will need outpt f/u.  Procedures:  none  Consultations:  none  Discharge Exam: Filed Vitals:   11/05/14 1010  BP: 165/81  Pulse: 79  Temp:   Resp:     General: comfortable Cardiovascular: RRR Respiratory: CTA Skin:  Erythematous plaques pips, dips  Discharge Instructions   Discharge Instructions    Activity as tolerated - No restrictions    Complete by:  As directed      Diet - low sodium heart healthy    Complete by:  As directed      Diet Carb Modified    Complete by:  As directed           Current Discharge Medication List    START taking these medications   Details  HYDROcodone-acetaminophen (NORCO/VICODIN) 5-325 MG per tablet Take 1 tablet by mouth every 6 (six) hours as needed for moderate pain or severe pain. Qty: 30 tablet,  Refills: 0    meloxicam (MOBIC) 7.5 MG tablet Take 1 tablet (7.5 mg total) by mouth daily. Qty: 30 tablet, Refills: 0      CONTINUE these medications which have CHANGED   Details  insulin glargine (LANTUS) 100 UNIT/ML injection Inject 0.4 mLs (40 Units total) into the skin at bedtime. Qty: 10 mL, Refills: 11    predniSONE (DELTASONE) 20 MG tablet Take 2 tablets (40 mg total) by mouth daily with breakfast. Qty: 30 tablet, Refills: 0      CONTINUE these medications which have NOT CHANGED   Details  albuterol (PROVENTIL HFA;VENTOLIN HFA) 108 (90 BASE) MCG/ACT inhaler Inhale 2 puffs into the lungs every 6 (six) hours as needed for wheezing or shortness of breath.    allopurinol (ZYLOPRIM) 300 MG tablet Take 300 mg by mouth daily.    aspirin EC 81 MG tablet Take 81 mg by mouth daily.    fluticasone (FLONASE) 50 MCG/ACT nasal spray Place 2 sprays into both nostrils daily.    gabapentin (NEURONTIN) 600 MG tablet Take 600 mg by mouth 2 (two) times daily.    mometasone (ASMANEX) 220 MCG/INH inhaler Inhale 2 puffs into the lungs daily.    olmesartan (BENICAR) 40 MG tablet Take 40 mg by mouth daily.    pramoxine-mineral oil-zinc (TUCKS) 1-12.5 % rectal ointment Place 1 application rectally every 2 (two) hours as needed for itching.    Propylene Glycol 0.6 % SOLN Apply 1 drop to eye as needed (dry eye).    RANITIDINE HCL PO Take 1 tablet by mouth daily.    cetirizine (ZYRTEC) 10 MG tablet Take 10 mg by mouth every morning.     insulin aspart protamine- aspart (NOVOLOG MIX 70/30) (70-30) 100 UNIT/ML injection Inject 20-30 Units into the skin 2 (two) times daily. 30 units in the morning and 20 units in the evening    montelukast (SINGULAIR) 10 MG tablet Take 10 mg by mouth at bedtime.      STOP taking these medications     traMADol (ULTRAM) 50 MG tablet        Allergies  Allergen Reactions  . Sulfa Antibiotics Itching and Rash    Burning also  . Penicillins Itching and Rash    Follow-up Information    Follow up with Aris Lot, MD On 11/06/2014.   Specialty:  Dermatology   Why:  at 9:30, dermatology   Contact information:   [address] [place] [address] [phone]        The results of significant diagnostics from this hospitalization (including imaging, microbiology, ancillary and laboratory) are listed below for reference.    Significant Diagnostic Studies: Dg Chest 2 View  10/26/2014   CLINICAL DATA:  Fever.  Generalized body aches.  Diabetes.  Cough.  EXAM: CHEST  2 VIEW  COMPARISON:  None.  FINDINGS: Lateral view degraded by patient arm position and mild motion. Midline trachea. Borderline cardiomegaly. Mediastinal contours otherwise within normal limits. No pleural effusion or pneumothorax. Clear lungs.  IMPRESSION: Borderline cardiomegaly, without acute disease.  Degraded lateral view.   Electronically Signed  By: Jeronimo Greaves M.D.   On: 10/26/2014 17:20   Ct Chest W Contrast  10/27/2014   CLINICAL DATA:  Acute onset of generalized body aches and fever. Initial encounter.  EXAM: CT CHEST, ABDOMEN, AND PELVIS WITH CONTRAST  TECHNIQUE: Multidetector CT imaging of the chest, abdomen and pelvis was performed following the standard protocol during bolus administration of intravenous contrast.  CONTRAST:  OMNIPAQUE IOHEXOL 300 MG/ML  SOLN  COMPARISON:  Renal ultrasound performed 06/26/2013, and MRI of the lumbar spine performed 05/19/2013  FINDINGS: CT CHEST FINDINGS  Mild bibasilar atelectasis or scarring is noted. Scattered blebs are noted within the right upper lobe, and to a lesser extent within the left upper lobe. There is nonspecific minimal peripheral haziness within both lungs, with a ring of peripheral sparing. No pleural effusion or pneumothorax is seen. No mass is identified.  Scattered coronary artery calcification is seen. The mediastinum is otherwise unremarkable. Visualized mediastinal nodes remain normal in size. No  pericardial effusion is identified. The great vessels are grossly unremarkable in appearance. The visualized portions of thyroid gland are unremarkable. No axillary lymphadenopathy is seen.  No acute osseous abnormalities are identified.  CT ABDOMEN AND PELVIS FINDINGS  A calcified granuloma is noted within the right hepatic lobe. The liver and spleen are otherwise unremarkable in appearance for. The gallbladder is within normal limits. The pancreas and adrenal glands are unremarkable.  Bilateral renal scarring is noted. Mild nonspecific perinephric stranding is noted bilaterally. The kidneys are otherwise unremarkable. There is no evidence of hydronephrosis. No renal or ureteral stones are seen.  No free fluid is identified. The small bowel is unremarkable in appearance. The stomach is within normal limits. No acute vascular abnormalities are seen. Scattered calcification is noted along the common iliac arteries bilaterally.  The appendix is normal in caliber, without evidence for appendicitis. Contrast progresses to the level of the distal descending colon. The colon is unremarkable in appearance.  The bladder is mildly distended and grossly unremarkable. The prostate remains normal in size. No inguinal lymphadenopathy is seen.  No acute osseous abnormalities are identified.  IMPRESSION: 1. No acute abnormality seen to explain the patient's symptoms. 2. Nonspecific minimal peripheral haziness noted within both lungs, with a ring of peripheral sparing. This is of uncertain significance. Mild bibasilar atelectasis or scarring noted. Scattered blebs within the upper lobes, more prominent on the right. 3. Scattered coronary artery calcifications seen. 4. Scattered calcification along the common iliac arteries bilaterally.   Electronically Signed   By: Roanna Raider M.D.   On: 10/27/2014 00:30   Mr Lumbar Spine W Wo Contrast  11/05/2014   CLINICAL DATA:  Initial evaluation for fever and low back pain.  EXAM: MRI  LUMBAR SPINE WITHOUT AND WITH CONTRAST  TECHNIQUE: Multiplanar and multiecho pulse sequences of the lumbar spine were obtained without and with intravenous contrast.  CONTRAST:  20mL MULTIHANCE GADOBENATE DIMEGLUMINE 529 MG/ML IV SOLN  COMPARISON:  Prior study from 05/19/2013  FINDINGS: For the purposes of this dictation, the lowest well-formed intervertebral disc spaces presumed to be the L5-S1 level, and there presumed to be 5 lumbar type vertebral bodies.  Vertebral bodies are normally aligned with preservation of the normal lumbar lordosis. Vertebral body heights are preserved. Signal intensity within the vertebral body bone marrow within normal limits. No focal osseous lesion.  Conus medullaris terminates normally at the L1 level. Signal intensity within the visualized cord is normal. Nerve roots of the cauda equina within normal limits.  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  No abnormal enhancement identified. No evidence for osteomyelitis discitis.  Paraspinous soft tissues within normal limits.  At T11-12 and T12-L1, no disc bulge or disc protrusion. No significant facet arthrosis. No canal or foraminal stenosis.  L1-2: Mild disc desiccation with shallow disc bulge. Tiny concentric annular tear noted. No significant stenosis.  L2-3: Disc desiccation with shallow broad-based disc bulge. No focal disc herniation. Very mild canal stenosis. No significant foraminal narrowing.  L3-4: Shallow right far lateral disc protrusion again noted, contacting the exiting right L3 nerve root as it courses out of the neural foramen. No frank neural impingement or displacement. No significant canal or foraminal stenosis.  No significant disc bulge or focal disc protrusion. Congenitally short pedicles noted. Previously seen left-sided facet arthritis at this level has largely resolved with edema and inflammatory changes in this region no longer seen. Very mild canal and foraminal  narrowing due to short pedicles is stable.  L5-S1: No significant disc bulge or focal disc protrusion. There is left-sided facet arthrosis. Previously seen extensive edema and inflammatory changes about the left L5-S1 facet have largely resolved. Mild residual enhancement about the left L5-S1 facet likely degenerative in nature. No significant canal stenosis. Mild left foraminal narrowing due to bony overgrowth at the left L5-S1 facet.  IMPRESSION: 1. No MRI evidence for acute infection or osteomyelitis discitis within the lumbar spine. 2. Moderate left-sided facet arthrosis at L5-S1 with associated minimal enhancement, likely degenerative in nature. This is markedly improved relative to extensive inflammation and edema seen within this region on prior MRI from 2014. 3. Mild multilevel degenerative disc disease as detailed above, not significantly changed relative to prior study.   Electronically Signed   By: Rise Mu M.D.   On: 11/05/2014 05:06   Ct Abdomen Pelvis W Contrast  10/27/2014   CLINICAL DATA:  Acute onset of generalized body aches and fever. Initial encounter.  EXAM: CT CHEST, ABDOMEN, AND PELVIS WITH CONTRAST  TECHNIQUE: Multidetector CT imaging of the chest, abdomen and pelvis was performed following the standard protocol during bolus administration of intravenous contrast.  CONTRAST:  OMNIPAQUE IOHEXOL 300 MG/ML  SOLN  COMPARISON:  Renal ultrasound performed 06/26/2013, and MRI of the lumbar spine performed 05/19/2013  FINDINGS: CT CHEST FINDINGS  Mild bibasilar atelectasis or scarring is noted. Scattered blebs are noted within the right upper lobe, and to a lesser extent within the left upper lobe. There is nonspecific minimal peripheral haziness within both lungs, with a ring of peripheral sparing. No pleural effusion or pneumothorax is seen. No mass is identified.  Scattered coronary artery calcification is seen. The mediastinum is otherwise unremarkable. Visualized mediastinal  nodes remain normal in size. No pericardial effusion is identified. The great vessels are grossly unremarkable in appearance. The visualized portions of thyroid gland are unremarkable. No axillary lymphadenopathy is seen.  No acute osseous abnormalities are identified.  CT ABDOMEN AND PELVIS FINDINGS  A calcified granuloma is noted within the right hepatic lobe. The liver and spleen are otherwise unremarkable in appearance for. The gallbladder is within normal limits. The pancreas and adrenal glands are unremarkable.  Bilateral renal scarring is noted. Mild nonspecific perinephric stranding is noted bilaterally. The kidneys are otherwise unremarkable. There is no evidence of hydronephrosis. No renal or ureteral stones are seen.  No free fluid is identified. The small bowel is unremarkable in appearance. The stomach is within normal limits. No acute vascular abnormalities are seen.  Scattered calcification is noted along the common iliac arteries bilaterally.  The appendix is normal in caliber, without evidence for appendicitis. Contrast progresses to the level of the distal descending colon. The colon is unremarkable in appearance.  The bladder is mildly distended and grossly unremarkable. The prostate remains normal in size. No inguinal lymphadenopathy is seen.  No acute osseous abnormalities are identified.  IMPRESSION: 1. No acute abnormality seen to explain the patient's symptoms. 2. Nonspecific minimal peripheral haziness noted within both lungs, with a ring of peripheral sparing. This is of uncertain significance. Mild bibasilar atelectasis or scarring noted. Scattered blebs within the upper lobes, more prominent on the right. 3. Scattered coronary artery calcifications seen. 4. Scattered calcification along the common iliac arteries bilaterally.   Electronically Signed   By: Roanna Raider M.D.   On: 10/27/2014 00:30   Dg Chest Port 1 View  11/02/2014   CLINICAL DATA:  nausea and vomiting with generalized  pain all over body. Pt was discharged from hospital yesterday. Hx HTN, gout, sickle cell anemia  EXAM: PORTABLE CHEST - 1 VIEW  COMPARISON:  10/26/2014  FINDINGS: Lungs are clear. Heart size and mediastinal contours are within normal limits. No effusion. Visualized skeletal structures are unremarkable.  IMPRESSION: No acute cardiopulmonary disease.   Electronically Signed   By: Corlis Leak M.D.   On: 11/02/2014 13:04    Microbiology: Recent Results (from the past 240 hour(s))  Blood culture (routine x 2)     Status: None   Collection Time: 10/26/14 10:48 PM  Result Value Ref Range Status   Specimen Description BLOOD RIGHT HAND  Final   Special Requests BOTTLES DRAWN AEROBIC AND ANAEROBIC 4CC EACH  Final   Culture   Final    NO GROWTH 5 DAYS Performed at [facility]    Report Status 11/02/2014 FINAL  Final  Blood culture (routine x 2)     Status: None   Collection Time: 10/26/14 10:53 PM  Result Value Ref Range Status   Specimen Description BLOOD LEFT HAND  Final   Special Requests BOTTLES DRAWN AEROBIC AND ANAEROBIC 5CC EACH  Final   Culture   Final    NO GROWTH 5 DAYS Performed at [facility]    Report Status 11/02/2014 FINAL  Final  Culture, blood (routine x 2)     Status: None   Collection Time: 10/30/14 12:12 PM  Result Value Ref Range Status   Specimen Description BLOOD LEFT ANTECUBITAL  Final   Special Requests BOTTLES DRAWN AEROBIC AND ANAEROBIC 10CC  Final   Culture   Final    NO GROWTH 5 DAYS Performed at [facility]    Report Status 11/05/2014 FINAL  Final  Culture, blood (routine x 2)     Status: None   Collection Time: 10/30/14 12:24 PM  Result Value Ref Range Status   Specimen Description BLOOD RIGHT WRIST  Final   Special Requests BOTTLES DRAWN AEROBIC AND ANAEROBIC 10CC  Final   Culture   Final    NO GROWTH 5 DAYS Performed at [facility]    Report Status 11/05/2014 FINAL  Final  Culture, expectorated sputum-assessment      Status: None   Collection Time: 10/31/14  7:22 PM  Result Value Ref Range Status   Specimen Description SPUTUM  Final   Special Requests NONE  Final   Report Status 11/01/2014 FINAL  Final  AFB culture with smear     Status: None (Preliminary result)  Collection Time: 10/31/14  7:22 PM  Result Value Ref Range Status   Specimen Description SPUTUM  Final   Special Requests NONE  Final   Acid Fast Smear   Final    NO ACID FAST BACILLI SEEN [facility]    Culture   Final    CULTURE WILL BE EXAMINED FOR 6 WEEKS BEFORE ISSUING A FINAL REPORT [facility]formed at [facility]    Report Status PENDING  Incomplete     Labs: Basic Metabolic Panel:  Recent Labs Lab 11/02/14 1210 11/03/14 0506 11/04/14 0543  NA 144 142 137  K 3.9 4.3 5.0  CL 106 107 104  CO2 27 29 28   GLUCOSE 144* 235* 364*  BUN 18 16 16   CREATININE 1.32 1.34 1.02  CALCIUM 8.8 7.8* 8.4  MG  --   --  2.0   Liver Function Tests:  Recent Labs Lab 11/02/14 1210 11/03/14 0506 11/04/14 0543  AST 31 32 32  ALT 21 19 21   ALKPHOS 65 54 58  BILITOT 0.6 0.5 0.5  PROT 6.6 5.0* 5.6*  ALBUMIN 2.8* 2.1* 2.2*   No results for input(s): LIPASE, AMYLASE in the last 168 hours. No results for input(s): AMMONIA in the last 168 hours. CBC:  Recent Labs Lab 10/30/14 1212 11/02/14 1210 11/04/14 0543  WBC 8.9 10.5 7.3  NEUTROABS 8.0* 8.9*  --   HGB 11.1* 12.1* 11.4*  HCT 34.5* 37.1* 34.1*  MCV 79.7 78.3 78.6  PLT 224 325 236   Cardiac Enzymes:  Recent Labs Lab 11/02/14 1210  TROPONINI <0.03   BNP: BNP (last 3 results) No results for input(s): BNP in the last 8760 hours.  ProBNP (last 3 results) No results for input(s): PROBNP in the last 8760 hours.  CBG:  Recent Labs Lab 11/04/14 0726 11/04/14 1135 11/04/14 1733 11/04/14 2224 11/05/14 0755  GLUCAP 317* 318* 321* 278* 325*       Signed:  Andray Assefa L  [facility] 11/05/2014, 11:06 AM

## 2014-11-05 NOTE — Progress Notes (Signed)
Physical Therapy Treatment Patient Details Name: Derek Anthony MRN: [id number] DOB: 08-04-1956 Today's Date: 11/05/2014    History of Present Illness Pt is a 59 y.o. male, with a PMH significant for diabetes, hypertension, GERD, anemia and gout. He was discharged from [facility]  3/11 after staying for 6 days for a workup of arthralgias, rash, fever and cough.He was seen by both infectious disease and pulmonary and had an extensive workup that is outlined in Dr. Karlyne Greenspan discharge summary of 3/11.Derek Anthony went home on 3/11 and felt fairly well in the evening until after dinner, when he began to feel severely fatigued. In the morning he got up having uncontrolled arthralgias particularly in his pectoral muscles and bilateral shoulders. He tried to use tramadol but did not receive relief. He went to urgent care and vomited in the parking lot. The urgent care physician directed him to [facility] ER. In the ER parking lot he once again vomited.  He continues to have a low grade fever (100.4). His oxygen sat at rest varied between 86 - 87%. Orthostatic vital signs were positive (119/63 lying, 93/61 standing). Pt was readmitted for orthostatic hypotension, hypoxia, weakness, and pain control.    PT Comments    Patient progressing with ambulation and able to complete without use of cane. Patient stated that he didn't fell as if he would need one at home. Patient also able to complete steps as he has stairs inside of his house. Patient safe to D/C from a mobility standpoint based on progression towards goals set on PT eval.    Follow Up Recommendations  No PT follow up;Supervision for mobility/OOB     Equipment Recommendations  None recommended by PT    Recommendations for Other Services       Precautions / Restrictions Precautions Precautions: Fall Restrictions Weight Bearing Restrictions: No    Mobility  Bed Mobility Overal bed mobility: Modified Independent                 Transfers Overall transfer level: Modified independent                  Ambulation/Gait Ambulation/Gait assistance: Supervision Ambulation Distance (Feet): 600 Feet Assistive device: None   Gait velocity: guarded but increased   General Gait Details: Patient able to ambulate without use of cane with no LOB   Stairs   Stairs assistance: Min guard Stair Management: Alternating pattern;Forwards;One rail Left Number of Stairs: 7 General stair comments: Patient did well with no LOB.   Wheelchair Mobility    Modified Rankin (Stroke Patients Only)       Balance                                    Cognition Arousal/Alertness: Awake/alert Behavior During Therapy: WFL for tasks assessed/performed Overall Cognitive Status: Within Functional Limits for tasks assessed                      Exercises      General Comments        Pertinent Vitals/Pain Pain Assessment: No/denies pain    Home Living                      Prior Function            PT Goals (current goals can now be found in the care plan section) Progress  towards PT goals: Progressing toward goals    Frequency  Min 3X/week    PT Plan Current plan remains appropriate    Co-evaluation             End of Session   Activity Tolerance: Patient tolerated treatment well Patient left: in bed;with call [place]/phone within reach     Time: 1310-1325 PT Time Calculation (min) (ACUTE ONLY): 15 min  Charges:  $Gait Training: 8-22 mins                    G Codes:      Fredrich BirksRobinette, Julia Elizabeth 11/05/2014, 2:58 PM 11/05/2014 Fredrich Birksobinette, Julia Elizabeth PTA [phone] pager [phone] office

## 2014-11-06 ENCOUNTER — Other Ambulatory Visit: Payer: Self-pay | Admitting: Dermatology

## 2014-11-07 LAB — PARVOVIRUS B19 ANTIBODY, IGG AND IGM
PAROVIRUS B19 IGG ABS: 0.9 {index} — AB (ref <0.9)
Parovirus B19 IgM Abs: 0.1 index (ref <0.9)

## 2014-12-19 ENCOUNTER — Other Ambulatory Visit: Payer: Self-pay | Admitting: General Surgery

## 2014-12-19 ENCOUNTER — Encounter (HOSPITAL_BASED_OUTPATIENT_CLINIC_OR_DEPARTMENT_OTHER): Payer: Self-pay | Admitting: *Deleted

## 2014-12-19 NOTE — H&P (Signed)
Derek Anthony  Location: [facility] Patient #: [id number] DOB: 12/30/1955 Married / Language: English / Race: Black or African American Male       History of Present Illness    The patient is a 59 year old male who presents with a complaint of dermatomyositis. This is a 59 year old African American male, referred by Dr. Zenovia Jordan for consideration of muscle biopsy. Dr. Jeryl Columbia is for his PCP. This patient has had a 40 pound weight loss in the last 6 months. He was hospitalized in early March of this year with diffuse muscle pain and skin rash. 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 Trials of prednisone have not helped nor have they worsened his situation. He now has mouth ulcers and [name] is also considering Behcet's syndrome. He has a positive ANA, low positive SSA. Positive Jo 1 antibody. CK and aldolase are negative.  We are going to schedule him immediately for a left thigh muscle biopsy under anesthesia. I discussed the indications, details, techniques, and numerous risks of this surgery with the patient and his wife. They're aware the risk of bleeding, infection, nerve damage with chronic pain or numbness, skin dehiscence, and other unforeseen problems. He understands these issues well. The stoma was questions were answered. He is very much in agreement with this plan. We will try to get his preop labs today or tomorrow and do the surgery on Monday.   Other Problems  Arthritis Back Pain Depression Diabetes Mellitus Gastroesophageal Reflux Disease Hemorrhoids Hepatitis High blood pressure Hypercholesterolemia Pancreatitis Sickle cell disease  Past Surgical History  Knee Surgery Right. Shoulder Surgery Left. Vasectomy  Diagnostic Studies History Colonoscopy 1-5 years ago  Allergies  Penicillins Rash. Sulfa Antibiotics Rash.  Medication History Allopurinol (  Tablet, Oral daily)  Active. Aspirin EC (  Tablet DR, Oral daily) Active. ZyrTEC Allergy (  Tablet, Oral daily) Active. Singulair (  Tablet, Oral daily) Active. Gabapentin (  Tablet, Oral) Active. Flonase (50MCG/ACT Suspension, Nasal) Active. NovoLOG Mix 70/30 ((70-30) 100UNIT/ML Suspension, Subcutaneous as needed) Active. Lantus SoloStar (100UNIT/ML Soln Pen-inj, Subcutaneous) Active. Benicar (  Tablet, Oral daily) Active. Albuterol Sulfate HFA (108 (90 Base)MCG/ACT Aerosol Soln, Inhalation) Active. Mobic (7.5MG  Tablet, Oral) Active. TraMADol HCl (  Tablet, Oral) Active. Hydrocodone-Acetaminophen (5-325MG  Tablet, Oral as needed) Active.  Social History Alcohol use Remotely quit alcohol use. Caffeine use Carbonated beverages, Coffee, Tea. Tobacco use Former smoker.  Family History Alcohol Abuse Father, Mother, Son. Arthritis Mother. Cancer Brother, Father. Cerebrovascular Accident Mother. Depression Mother. Diabetes Mellitus Father. Hypertension Father, Mother.  Review of Systems General Present- Appetite Loss, Chills, Fatigue, Fever and Weight Loss. Not Present- Night Sweats and Weight Gain. Skin Present- Dryness and Rash. Not Present- Change in Wart/Mole, Hives, Jaundice, New Lesions, Non-Healing Wounds and Ulcer. HEENT Present- Nose Bleed, Oral Ulcers, Ringing in the Ears, Seasonal Allergies, Sinus Pain, Sore Throat and Wears glasses/contact lenses. Not Present- Earache, Hearing Loss, Hoarseness, Visual Disturbances and Yellow Eyes. Respiratory Present- Bloody sputum and Difficulty Breathing. Not Present- Chronic Cough, Snoring and Wheezing. Cardiovascular Present- Difficulty Breathing Lying Down, Shortness of Breath and Swelling of Extremities. Not Present- Chest Pain, Leg Cramps, Palpitations and Rapid Heart Rate. Gastrointestinal Present- Difficulty Swallowing and Excessive gas. Not Present- Abdominal Pain, Bloating, Bloody Stool, Change in Bowel Habits,  Chronic diarrhea, Constipation, Gets full quickly at meals, Hemorrhoids, Indigestion, Nausea, Rectal Pain and Vomiting. Musculoskeletal Present- Back Pain, Joint Pain, Joint Stiffness, Muscle Pain, Muscle Weakness and Swelling of Extremities. Neurological Present- Headaches, Trouble walking and  Weakness. Not Present- Decreased Memory, Fainting, Numbness, Seizures, Tingling and Tremor. Psychiatric Present- Change in Sleep Pattern and Depression. Not Present- Anxiety, Bipolar, Fearful and Frequent crying. Hematology Present- Easy Bruising. Not Present- Excessive bleeding, Gland problems, HIV and Persistent Infections.   Vitals  Weight: 220 lb Height: 73in Body Surface Area: 2.27 m Body Mass Index: 29.03 kg/m Temp.: 97.17F(Oral)  Pulse: 92 (Regular)  BP: 104/62 (Sitting, Right Arm, Standard)    Physical Exam  General Mental Status-Alert. General Appearance-Consistent with stated age. Hydration-Well hydrated. Voice-Normal. Note: Very cooperative but very weak. Came in a wheelchair. Keep his eyes shut most of the time. Answers questions appropriately. Obviously feels bad. Wife is with him throughout the visit.   Integumentary Note: Skin rash on hands noted.   Head and Neck Head-normocephalic, atraumatic with no lesions or palpable masses. Trachea-midline. Thyroid Gland Characteristics - normal size and consistency.  Eye Eyeball - Bilateral-Extraocular movements intact. Sclera/Conjunctiva - Bilateral-No scleral icterus.  Chest and Lung Exam Chest and lung exam reveals -quiet, even and easy respiratory effort with no use of accessory muscles and on auscultation, normal breath sounds, no adventitious sounds and normal vocal resonance. Inspection Chest Wall - Normal. Back - normal.  Cardiovascular Cardiovascular examination reveals -normal heart sounds, regular rate and rhythm with no murmurs and normal pedal pulses  bilaterally.  Abdomen Inspection Inspection of the abdomen reveals - No Hernias. Skin - Scar - no surgical scars. Palpation/Percussion Palpation and Percussion of the abdomen reveal - Soft, Non Tender, No Rebound tenderness, No Rigidity (guarding) and No hepatosplenomegaly. Auscultation Auscultation of the abdomen reveals - Bowel sounds normal.  Neurologic Neurologic evaluation reveals -alert and oriented x 3 with no impairment of recent or remote memory. Mental Status-Normal.  Musculoskeletal Note: Muscle wasting lower extremities greater than upper extremities. Diffuse weakness. He can lift his arms and legs against mild resistance, however. Reports pain subjectively when he moves. Muscles don't seem to be tender, however Did not ambulate.     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 Nickola MajorHawkes, has requested a muscle biopsy This is very appropriate, considering your medical problems You will be scheduled immediately for a left thigh muscle biopsy under anesthesia. This is an outpatient surgery so you can go home the same day We have discussed the techniques and numerous risk of this operation in detail.  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 Derrell LollingIngram, M.D., [facility] Center[facility]ACS [facility] General and Minimally invasive Surgery Breast and Colorectal Surgery Office:   [phone] Pager:   [phone]

## 2014-12-19 NOTE — Progress Notes (Signed)
Pt home now-was in hospital-having severe pain and very weak-can bearly get up-bs 260-they stopped prednisone, now worse advised him to call pcp-will need istat

## 2014-12-23 ENCOUNTER — Ambulatory Visit (HOSPITAL_BASED_OUTPATIENT_CLINIC_OR_DEPARTMENT_OTHER): Payer: 59 | Admitting: Anesthesiology

## 2014-12-23 ENCOUNTER — Encounter (HOSPITAL_BASED_OUTPATIENT_CLINIC_OR_DEPARTMENT_OTHER)
Admission: RE | Disposition: A | Discharge status: Home / Self Care | Payer: Self-pay | Source: Ambulatory Visit | Attending: General Surgery

## 2014-12-23 ENCOUNTER — Ambulatory Visit (HOSPITAL_BASED_OUTPATIENT_CLINIC_OR_DEPARTMENT_OTHER)
Admission: RE | Admit: 2014-12-23 | Discharge: 2014-12-23 | Disposition: A | Discharge status: Home / Self Care | Payer: 59 | Source: Ambulatory Visit | Attending: General Surgery | Admitting: General Surgery

## 2014-12-23 ENCOUNTER — Encounter (HOSPITAL_BASED_OUTPATIENT_CLINIC_OR_DEPARTMENT_OTHER): Payer: Self-pay

## 2014-12-23 DIAGNOSIS — I10 Essential (primary) hypertension: Secondary | ICD-10-CM | POA: Diagnosis not present

## 2014-12-23 DIAGNOSIS — Z87891 Personal history of nicotine dependence: Secondary | ICD-10-CM | POA: Diagnosis not present

## 2014-12-23 DIAGNOSIS — M3390 Dermatopolymyositis, unspecified, organ involvement unspecified: Secondary | ICD-10-CM | POA: Diagnosis not present

## 2014-12-23 DIAGNOSIS — Z7982 Long term (current) use of aspirin: Secondary | ICD-10-CM | POA: Insufficient documentation

## 2014-12-23 DIAGNOSIS — M1000 Idiopathic gout, unspecified site: Secondary | ICD-10-CM | POA: Insufficient documentation

## 2014-12-23 DIAGNOSIS — K219 Gastro-esophageal reflux disease without esophagitis: Secondary | ICD-10-CM | POA: Insufficient documentation

## 2014-12-23 DIAGNOSIS — Z791 Long term (current) use of non-steroidal anti-inflammatories (NSAID): Secondary | ICD-10-CM | POA: Diagnosis not present

## 2014-12-23 DIAGNOSIS — E1140 Type 2 diabetes mellitus with diabetic neuropathy, unspecified: Secondary | ICD-10-CM | POA: Insufficient documentation

## 2014-12-23 DIAGNOSIS — Z7951 Long term (current) use of inhaled steroids: Secondary | ICD-10-CM | POA: Insufficient documentation

## 2014-12-23 DIAGNOSIS — K121 Other forms of stomatitis: Secondary | ICD-10-CM | POA: Diagnosis not present

## 2014-12-23 DIAGNOSIS — J45909 Unspecified asthma, uncomplicated: Secondary | ICD-10-CM | POA: Insufficient documentation

## 2014-12-23 DIAGNOSIS — Z79891 Long term (current) use of opiate analgesic: Secondary | ICD-10-CM | POA: Insufficient documentation

## 2014-12-23 DIAGNOSIS — Z794 Long term (current) use of insulin: Secondary | ICD-10-CM | POA: Diagnosis not present

## 2014-12-23 DIAGNOSIS — M3313 Other dermatomyositis without myopathy: Secondary | ICD-10-CM | POA: Diagnosis present

## 2014-12-23 HISTORY — DX: Myalgia, unspecified site: M79.10

## 2014-12-23 HISTORY — DX: Dermatopolymyositis, unspecified, organ involvement unspecified: M33.90

## 2014-12-23 HISTORY — DX: Presence of spectacles and contact lenses: Z97.3

## 2014-12-23 HISTORY — PX: MUSCLE BIOPSY: SHX716

## 2014-12-23 HISTORY — DX: Other dermatomyositis without myopathy: M33.13

## 2014-12-23 LAB — GLUCOSE, CAPILLARY: GLUCOSE-CAPILLARY: 146 mg/dL — AB (ref 70–99)

## 2014-12-23 SURGERY — MUSCLE BIOPSY
Anesthesia: General | Site: Thigh | Laterality: Left

## 2014-12-23 MED ORDER — LIDOCAINE HCL (CARDIAC) 20 MG/ML IV SOLN
INTRAVENOUS | Status: DC | PRN
  Administered 2014-12-23: 50 mg via INTRAVENOUS

## 2014-12-23 MED ORDER — ACETAMINOPHEN 325 MG PO TABS: 650.0000 mg | ORAL_TABLET | ORAL | Status: DC | PRN

## 2014-12-23 MED ORDER — KETOROLAC TROMETHAMINE 30 MG/ML IJ SOLN
30.0000 mg | Freq: Once | INTRAMUSCULAR | Status: AC | PRN
  Administered 2014-12-23: 30 mg via INTRAVENOUS

## 2014-12-23 MED ORDER — BUPIVACAINE-EPINEPHRINE 0.5% -1:200000 IJ SOLN
INTRAMUSCULAR | Status: DC | PRN
  Administered 2014-12-23: 10 mL

## 2014-12-23 MED ORDER — SODIUM CHLORIDE 0.9 % IV SOLN: INTRAVENOUS | Status: DC

## 2014-12-23 MED ORDER — ACETAMINOPHEN 650 MG RE SUPP: 650.0000 mg | RECTAL | Status: DC | PRN

## 2014-12-23 MED ORDER — PROPOFOL 500 MG/50ML IV EMUL
INTRAVENOUS | Status: AC
  Filled 2014-12-23: qty 50

## 2014-12-23 MED ORDER — MIDAZOLAM HCL 2 MG/2ML IJ SOLN
INTRAMUSCULAR | Status: AC
  Filled 2014-12-23: qty 2

## 2014-12-23 MED ORDER — SODIUM CHLORIDE 0.9 % IJ SOLN: 3.0000 mL | Freq: Two times a day (BID) | INTRAMUSCULAR | Status: DC

## 2014-12-23 MED ORDER — FENTANYL CITRATE (PF) 100 MCG/2ML IJ SOLN: 50.0000 ug | INTRAMUSCULAR | Status: DC | PRN

## 2014-12-23 MED ORDER — MIDAZOLAM HCL 5 MG/5ML IJ SOLN
INTRAMUSCULAR | Status: DC | PRN
  Administered 2014-12-23: 2 mg via INTRAVENOUS

## 2014-12-23 MED ORDER — PROPOFOL 10 MG/ML IV BOLUS
INTRAVENOUS | Status: AC
  Filled 2014-12-23: qty 80

## 2014-12-23 MED ORDER — PROMETHAZINE HCL 25 MG/ML IJ SOLN: 6.2500 mg | INTRAMUSCULAR | Status: DC | PRN

## 2014-12-23 MED ORDER — ONDANSETRON HCL 4 MG/2ML IJ SOLN
INTRAMUSCULAR | Status: DC | PRN
  Administered 2014-12-23: 4 mg via INTRAVENOUS

## 2014-12-23 MED ORDER — PROPOFOL 10 MG/ML IV BOLUS
INTRAVENOUS | Status: DC | PRN
  Administered 2014-12-23: 200 mg via INTRAVENOUS

## 2014-12-23 MED ORDER — VANCOMYCIN HCL 1000 MG IV SOLR
1000.0000 mg | INTRAVENOUS | Status: DC | PRN
  Administered 2014-12-23: 1000 mg via INTRAVENOUS

## 2014-12-23 MED ORDER — FENTANYL CITRATE (PF) 100 MCG/2ML IJ SOLN
INTRAMUSCULAR | Status: DC | PRN
  Administered 2014-12-23: 25 ug via INTRAVENOUS
  Administered 2014-12-23: 512 ug via INTRAVENOUS
  Administered 2014-12-23 (×2): 50 ug via INTRAVENOUS

## 2014-12-23 MED ORDER — CHLORHEXIDINE GLUCONATE 4 % EX LIQD: 1.0000 "application " | Freq: Once | CUTANEOUS | Status: DC

## 2014-12-23 MED ORDER — SODIUM CHLORIDE 0.9 % IJ SOLN: 3.0000 mL | INTRAMUSCULAR | Status: DC | PRN

## 2014-12-23 MED ORDER — KETOROLAC TROMETHAMINE 30 MG/ML IJ SOLN
INTRAMUSCULAR | Status: AC
  Filled 2014-12-23: qty 1

## 2014-12-23 MED ORDER — OXYCODONE HCL 5 MG PO TABS
ORAL_TABLET | ORAL | Status: AC
  Filled 2014-12-23: qty 1

## 2014-12-23 MED ORDER — SODIUM CHLORIDE 0.9 % IV SOLN: 1500.0000 mg | INTRAVENOUS | Status: DC

## 2014-12-23 MED ORDER — FENTANYL CITRATE (PF) 100 MCG/2ML IJ SOLN
25.0000 ug | INTRAMUSCULAR | Status: DC | PRN
  Administered 2014-12-23 (×2): 50 ug via INTRAVENOUS

## 2014-12-23 MED ORDER — FENTANYL CITRATE (PF) 100 MCG/2ML IJ SOLN
INTRAMUSCULAR | Status: AC
  Filled 2014-12-23: qty 2

## 2014-12-23 MED ORDER — HYDROCODONE-ACETAMINOPHEN 5-325 MG PO TABS: 1.0000 | ORAL_TABLET | Freq: Four times a day (QID) | ORAL | Status: AC | PRN

## 2014-12-23 MED ORDER — VANCOMYCIN HCL IN DEXTROSE 1-5 GM/200ML-% IV SOLN
INTRAVENOUS | Status: AC
  Filled 2014-12-23: qty 200

## 2014-12-23 MED ORDER — LACTATED RINGERS IV SOLN
INTRAVENOUS | Status: DC
  Administered 2014-12-23 (×2): via INTRAVENOUS

## 2014-12-23 MED ORDER — FENTANYL CITRATE (PF) 100 MCG/2ML IJ SOLN
INTRAMUSCULAR | Status: AC
  Filled 2014-12-23: qty 6

## 2014-12-23 MED ORDER — BUPIVACAINE-EPINEPHRINE (PF) 0.5% -1:200000 IJ SOLN
INTRAMUSCULAR | Status: AC
  Filled 2014-12-23: qty 30

## 2014-12-23 MED ORDER — OXYCODONE HCL 5 MG PO TABS
5.0000 mg | ORAL_TABLET | ORAL | Status: DC | PRN
  Administered 2014-12-23: 5 mg via ORAL

## 2014-12-23 MED ORDER — FENTANYL CITRATE (PF) 100 MCG/2ML IJ SOLN: 25.0000 ug | INTRAMUSCULAR | Status: DC | PRN

## 2014-12-23 MED ORDER — SODIUM CHLORIDE 0.9 % IV SOLN: 250.0000 mL | INTRAVENOUS | Status: DC | PRN

## 2014-12-23 MED ORDER — GLYCOPYRROLATE 0.2 MG/ML IJ SOLN: 0.2000 mg | Freq: Once | INTRAMUSCULAR | Status: DC | PRN

## 2014-12-23 SURGICAL SUPPLY — 47 items
APL SKNCLS STERI-STRIP NONHPOA (GAUZE/BANDAGES/DRESSINGS) ×1
BENZOIN TINCTURE PRP APPL 2/3 (GAUZE/BANDAGES/DRESSINGS) ×3 IMPLANT
BLADE CLIPPER SURG (BLADE) IMPLANT
BLADE HEX COATED 2.75 (ELECTRODE) ×3 IMPLANT
BLADE SURG 15 STRL LF DISP TIS (BLADE) ×1 IMPLANT
BLADE SURG 15 STRL SS (BLADE) ×3
CANISTER SUCT 1200ML W/VALVE (MISCELLANEOUS) IMPLANT
CHLORAPREP W/TINT 26ML (MISCELLANEOUS) ×3 IMPLANT
CLOSURE WOUND 1/2 X4 (GAUZE/BANDAGES/DRESSINGS) ×1
COVER BACK TABLE 60X90IN (DRAPES) ×3 IMPLANT
COVER MAYO STAND STRL (DRAPES) ×3 IMPLANT
DECANTER SPIKE VIAL GLASS SM (MISCELLANEOUS) ×3 IMPLANT
DRAPE LAPAROTOMY T 102X78X121 (DRAPES) ×3 IMPLANT
DRAPE UTILITY XL STRL (DRAPES) ×3 IMPLANT
DRSG TEGADERM 4X4.75 (GAUZE/BANDAGES/DRESSINGS) IMPLANT
ELECT REM PT RETURN 9FT ADLT (ELECTROSURGICAL) ×3
ELECTRODE REM PT RTRN 9FT ADLT (ELECTROSURGICAL) ×1 IMPLANT
GAUZE SPONGE 4X4 16PLY XRAY LF (GAUZE/BANDAGES/DRESSINGS) IMPLANT
GLOVE EUDERMIC 7 POWDERFREE (GLOVE) ×3 IMPLANT
GOWN STRL REUS W/ TWL LRG LVL3 (GOWN DISPOSABLE) ×1 IMPLANT
GOWN STRL REUS W/TWL LRG LVL3 (GOWN DISPOSABLE) ×3
NDL HYPO 25X1 1.5 SAFETY (NEEDLE) ×1 IMPLANT
NEEDLE HYPO 25X1 1.5 SAFETY (NEEDLE) ×3 IMPLANT
NS IRRIG 1000ML POUR BTL (IV SOLUTION) IMPLANT
PACK BASIN DAY SURGERY FS (CUSTOM PROCEDURE TRAY) ×3 IMPLANT
PENCIL BUTTON HOLSTER BLD 10FT (ELECTRODE) ×3 IMPLANT
SLEEVE SCD COMPRESS KNEE MED (MISCELLANEOUS) IMPLANT
SPONGE GAUZE 4X4 12PLY STER LF (GAUZE/BANDAGES/DRESSINGS) ×3 IMPLANT
STRIP CLOSURE SKIN 1/2X4 (GAUZE/BANDAGES/DRESSINGS) ×2 IMPLANT
SUT MNCRL AB 3-0 PS2 18 (SUTURE) IMPLANT
SUT MNCRL AB 4-0 PS2 18 (SUTURE) IMPLANT
SUT VIC AB 2-0 SH 27 (SUTURE) ×3
SUT VIC AB 2-0 SH 27XBRD (SUTURE) ×1 IMPLANT
SUT VIC AB 3-0 SH 27 (SUTURE)
SUT VIC AB 3-0 SH 27X BRD (SUTURE) IMPLANT
SUT VIC AB 4-0 SH 27 (SUTURE)
SUT VIC AB 4-0 SH 27XANBCTRL (SUTURE) IMPLANT
SUT VICRYL 3-0 CR8 SH (SUTURE) IMPLANT
SUT VICRYL AB 2 0 TIE (SUTURE) ×2 IMPLANT
SUT VICRYL AB 2 0 TIES (SUTURE) ×6
SYRINGE 10CC LL (SYRINGE) ×3 IMPLANT
TOWEL OR 17X24 6PK STRL BLUE (TOWEL DISPOSABLE) ×3 IMPLANT
TOWEL OR NON WOVEN STRL DISP B (DISPOSABLE) ×3 IMPLANT
TRAY DSU PREP LF (CUSTOM PROCEDURE TRAY) ×3 IMPLANT
TUBE CONNECTING 20'X1/4 (TUBING)
TUBE CONNECTING 20X1/4 (TUBING) IMPLANT
YANKAUER SUCT BULB TIP NO VENT (SUCTIONS) IMPLANT

## 2014-12-23 NOTE — Anesthesia Preprocedure Evaluation (Signed)
Anesthesia Evaluation  Patient identified by MRN, date of birth, ID band Patient awake    Reviewed: Allergy & Precautions, NPO status , Patient's Chart, lab work & pertinent test results  Airway Mallampati: II  TM Distance: >3 FB Neck ROM: Full    Dental no notable dental hx.    Pulmonary neg pulmonary ROS, former smoker,  breath sounds clear to auscultation  Pulmonary exam normal       Cardiovascular hypertension, negative cardio ROS  Rhythm:Regular Rate:Normal     Neuro/Psych negative neurological ROS  negative psych ROS   GI/Hepatic Neg liver ROS, GERD-  Medicated,  Endo/Other  diabetes, Insulin Dependent  Renal/GU negative Renal ROS  negative genitourinary   Musculoskeletal negative musculoskeletal ROS (+)   Abdominal   Peds negative pediatric ROS (+)  Hematology negative hematology ROS (+)   Anesthesia Other Findings   Reproductive/Obstetrics negative OB ROS                             Anesthesia Physical Anesthesia Plan  ASA: III  Anesthesia Plan: General   Post-op Pain Management:    Induction: Intravenous  Airway Management Planned: LMA  Additional Equipment:   Intra-op Plan:   Post-operative Plan: Extubation in OR  Informed Consent: I have reviewed the patients History and Physical, chart, labs and discussed the procedure including the risks, benefits and alternatives for the proposed anesthesia with the patient or authorized representative who has indicated his/her understanding and acceptance.   Dental advisory given  Plan Discussed with: CRNA and Surgeon  Anesthesia Plan Comments:         Anesthesia Quick Evaluation

## 2014-12-23 NOTE — Op Note (Signed)
Patient Name:           Derek Anthony   Date of Surgery:        12/23/2014  Pre op Diagnosis:      Dermatomyositis  Post op Diagnosis:    Dermatomyositis  Procedure:                 Left thigh muscle biopsy  Surgeon:                     Angelia MouldHaywood M. Derrell LollingIngram, M.D., FACS  Assistant:                      OR staff  Operative Indications:    . This is a 59 year old African American male, referred by Dr. Zenovia JordanAngela Hawkes for consideration of muscle biopsy.  This patient has had a 40 pound weight loss in the last 6 months. He was hospitalized in early March of this year with diffuse muscle pain and skin rash. 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 Trials of prednisone have not helped nor have they worsened his situation. He now has mouth ulcers and Dr. Nickola MajorHawkes is also considering Behcet's syndrome. He has a positive ANA, low positive SSA. Positive Jo 1 antibody. CK and aldolase are negative.  We are going to schedule him immediately for a left thigh muscle biopsy under anesthesia.   Operative Findings:       A 3 cm specimen of left vastus lateralis was taken in the proximal third of the thigh. The muscle appeared normal.  Procedure in Detail:          Following the induction of general LMA anesthesia the patient's left thigh was prepped and draped in a sterile fashion. Intravenous antibiotics were given. Surgical timeout was performed. 0.5% Marcaine with epinephrine was used as local infiltration anesthetic. A linear longitudinal incision was made in the left thigh, proximally, anterolateral position. Dissection was carried down through the subcutaneous tissue. Deep muscle fascia was incised exposing the muscle. Self retaining retractors were placed. Isolated 3 or 4 cm segment of muscle clamping it proximally and distally. It was divided with scissors. It was placed in a saline moistened sponge and transported immediately to the pathology lab with appropriate history  attached. The muscles were tied off with 2-0 Vicryl ties. The wound was irrigated. Hemostasis excellent. The subcutaneous tissue was closed with 3-0 Vicryl sutures and skin closed with a running subcuticular 4-0 Monocryl and Dermabond. Ace wrap was placed. Patient taken to PACU in stable condition. EBL 10 mL. Counts correct. Complications none.     Angelia MouldHaywood M. Derrell LollingIngram, M.D., FACS General and Minimally Invasive Surgery Breast and Colorectal Surgery  12/23/2014 7:58 AM

## 2014-12-23 NOTE — Interval H&P Note (Signed)
History and Physical Interval Note:  12/23/2014 7:18 AM  Derek Anthony  has presented today for surgery, with the diagnosis of dermatomyositis  The various methods of treatment have been discussed with the patient and family. After consideration of risks, benefits and other options for treatment, the patient has consented to  Procedure(s): MUSCLE BIOPSY LEFT THIGH (Left) as a surgical intervention .  The patient's history has been reviewed, patient examined, no change in status, stable for surgery.  I have reviewed the patient's chart and labs.  Questions were answered to the patient's satisfaction.     Ernestene MentionINGRAM,Lavone Barrientes M

## 2014-12-23 NOTE — Transfer of Care (Signed)
Immediate Anesthesia Transfer of Care Note  Patient: Derek Anthony  Procedure(s) Performed: Procedure(s): MUSCLE BIOPSY LEFT THIGH (Left)  Patient Location: PACU  Anesthesia Type:General  Level of Consciousness: awake and patient cooperative  Airway & Oxygen Therapy: Patient Spontanous Breathing and Patient connected to face mask oxygen  Post-op Assessment: Report given to RN and Post -op Vital signs reviewed and stable  Post vital signs: Reviewed and stable  Last Vitals:  Filed Vitals:   12/23/14 0643  BP: 120/103  Pulse: 109  Temp: 37.1 C  Resp: 20    Complications: No apparent anesthesia complications

## 2014-12-23 NOTE — Discharge Instructions (Signed)
Keep the left thigh wound clean and dry for 24 hours.  Starting tomorrow, you may take a quick shower. No tub baths for 2 weeks.  The bandage may be changed any time you wish.  Ice pack intermittently, 15 minutes at a time, for 24 hours.  Call Dr. Derrell LollingIngram if there are any problems with swelling, bleeding, or severe pain.  Call Dr. Zenovia JordanAngela Hawkes to discuss the pathology report next week.    Post Anesthesia Home Care Instructions  Activity: Get plenty of rest for the remainder of the day. A responsible adult should stay with you for 24 hours following the procedure.  For the next 24 hours, DO NOT: -Drive a car -Advertising copywriterperate machinery -Drink alcoholic beverages -Take any medication unless instructed by your physician -Make any legal decisions or sign important papers.  Meals: Start with liquid foods such as gelatin or soup. Progress to regular foods as tolerated. Avoid greasy, spicy, heavy foods. If nausea and/or vomiting occur, drink only clear liquids until the nausea and/or vomiting subsides. Call your physician if vomiting continues.  Special Instructions/Symptoms: Your throat may feel dry or sore from the anesthesia or the breathing tube placed in your throat during surgery. If this causes discomfort, gargle with warm salt water. The discomfort should disappear within 24 hours.  If you had a scopolamine patch placed behind your ear for the management of post- operative nausea and/or vomiting:  1. The medication in the patch is effective for 72 hours, after which it should be removed.  Wrap patch in a tissue and discard in the trash. Wash hands thoroughly with soap and water. 2. You may remove the patch earlier than 72 hours if you experience unpleasant side effects which may include dry mouth, dizziness or visual disturbances. 3. Avoid touching the patch. Wash your hands with soap and water after contact with the patch.   Call your surgeon if you experience:   1.  Fever over  101.0. 2.  Inability to urinate. 3.  Nausea and/or vomiting. 4.  Extreme swelling or bruising at the surgical site. 5.  Continued bleeding from the incision. 6.  Increased pain, redness or drainage from the incision. 7.  Problems related to your pain medication. 8. Any change in color, movement and/or sensation 9. Any problems and/or concerns

## 2014-12-23 NOTE — Anesthesia Postprocedure Evaluation (Signed)
  Anesthesia Post-op Note  Patient: Derek Anthony  Procedure(s) Performed: Procedure(s) (LRB): MUSCLE BIOPSY LEFT THIGH (Left)  Patient Location: PACU  Anesthesia Type: General  Level of Consciousness: awake and alert   Airway and Oxygen Therapy: Patient Spontanous Breathing  Post-op Pain: mild  Post-op Assessment: Post-op Vital signs reviewed, Patient's Cardiovascular Status Stable, Respiratory Function Stable, Patent Airway and No signs of Nausea or vomiting  Last Vitals:  Filed Vitals:   12/23/14 0643  BP: 120/103  Pulse: 109  Temp: 37.1 C  Resp: 20    Post-op Vital Signs: stable   Complications: No apparent anesthesia complications

## 2014-12-23 NOTE — Anesthesia Procedure Notes (Signed)
Procedure Name: LMA Insertion Date/Time: 12/23/2014 7:33 AM Performed by: Genevieve NorlanderLINKA, Tagg Eustice L Pre-anesthesia Checklist: Patient identified, Emergency Drugs available, Suction available, Patient being monitored and Timeout performed Patient Re-evaluated:Patient Re-evaluated prior to inductionOxygen Delivery Method: Circle System Utilized Preoxygenation: Pre-oxygenation with 100% oxygen Intubation Type: IV induction Ventilation: Mask ventilation without difficulty LMA: LMA inserted LMA Size: 5.0 Number of attempts: 1 Airway Equipment and Method: Bite block Placement Confirmation: positive ETCO2 Tube secured with: Tape Dental Injury: Teeth and Oropharynx as per pre-operative assessment

## 2014-12-26 ENCOUNTER — Encounter (HOSPITAL_BASED_OUTPATIENT_CLINIC_OR_DEPARTMENT_OTHER): Payer: Self-pay | Admitting: General Surgery

## 2014-12-30 LAB — POCT I-STAT, CHEM 8
BUN: 8 mg/dL (ref 6–20)
CHLORIDE: 102 mmol/L (ref 101–111)
Calcium, Ion: 1.1 mmol/L — ABNORMAL LOW (ref 1.13–1.30)
Creatinine, Ser: 1 mg/dL (ref 0.61–1.24)
GLUCOSE: 118 mg/dL — AB (ref 70–99)
HCT: 36 % — ABNORMAL LOW (ref 39.0–52.0)
Hemoglobin: 12.2 g/dL — ABNORMAL LOW (ref 13.0–17.0)
Potassium: 3.7 mmol/L (ref 3.5–5.1)
Sodium: 138 mmol/L (ref 135–145)
TCO2: 22 mmol/L (ref 0–100)

## 2015-01-01 ENCOUNTER — Encounter (HOSPITAL_COMMUNITY): Payer: Self-pay

## 2015-01-07 LAB — AFB CULTURE WITH SMEAR (NOT AT ARMC): Acid Fast Smear: NONE SEEN

## 2015-02-04 ENCOUNTER — Emergency Department (HOSPITAL_COMMUNITY): Payer: 59

## 2015-02-04 ENCOUNTER — Inpatient Hospital Stay (HOSPITAL_COMMUNITY)
Admission: EM | Admit: 2015-02-04 | Discharge: 2015-02-13 | DRG: 545 | Disposition: A | Discharge status: Home / Self Care | Payer: 59 | Attending: Internal Medicine | Admitting: Internal Medicine

## 2015-02-04 ENCOUNTER — Encounter (HOSPITAL_COMMUNITY): Payer: Self-pay | Admitting: *Deleted

## 2015-02-04 DIAGNOSIS — R05 Cough: Secondary | ICD-10-CM | POA: Diagnosis present

## 2015-02-04 DIAGNOSIS — E1140 Type 2 diabetes mellitus with diabetic neuropathy, unspecified: Secondary | ICD-10-CM | POA: Diagnosis present

## 2015-02-04 DIAGNOSIS — Z7952 Long term (current) use of systemic steroids: Secondary | ICD-10-CM

## 2015-02-04 DIAGNOSIS — E1165 Type 2 diabetes mellitus with hyperglycemia: Secondary | ICD-10-CM | POA: Diagnosis present

## 2015-02-04 DIAGNOSIS — E11649 Type 2 diabetes mellitus with hypoglycemia without coma: Secondary | ICD-10-CM | POA: Diagnosis present

## 2015-02-04 DIAGNOSIS — J189 Pneumonia, unspecified organism: Secondary | ICD-10-CM

## 2015-02-04 DIAGNOSIS — R918 Other nonspecific abnormal finding of lung field: Secondary | ICD-10-CM

## 2015-02-04 DIAGNOSIS — Z7982 Long term (current) use of aspirin: Secondary | ICD-10-CM

## 2015-02-04 DIAGNOSIS — I10 Essential (primary) hypertension: Secondary | ICD-10-CM | POA: Diagnosis present

## 2015-02-04 DIAGNOSIS — R059 Cough, unspecified: Secondary | ICD-10-CM | POA: Diagnosis present

## 2015-02-04 DIAGNOSIS — J309 Allergic rhinitis, unspecified: Secondary | ICD-10-CM | POA: Diagnosis present

## 2015-02-04 DIAGNOSIS — J9691 Respiratory failure, unspecified with hypoxia: Secondary | ICD-10-CM | POA: Diagnosis present

## 2015-02-04 DIAGNOSIS — D573 Sickle-cell trait: Secondary | ICD-10-CM | POA: Diagnosis present

## 2015-02-04 DIAGNOSIS — M3390 Dermatopolymyositis, unspecified, organ involvement unspecified: Secondary | ICD-10-CM | POA: Diagnosis not present

## 2015-02-04 DIAGNOSIS — K219 Gastro-esophageal reflux disease without esophagitis: Secondary | ICD-10-CM | POA: Diagnosis present

## 2015-02-04 DIAGNOSIS — R0609 Other forms of dyspnea: Secondary | ICD-10-CM

## 2015-02-04 DIAGNOSIS — M3391 Dermatopolymyositis, unspecified with respiratory involvement: Secondary | ICD-10-CM | POA: Diagnosis not present

## 2015-02-04 DIAGNOSIS — E119 Type 2 diabetes mellitus without complications: Secondary | ICD-10-CM | POA: Diagnosis present

## 2015-02-04 DIAGNOSIS — M109 Gout, unspecified: Secondary | ICD-10-CM | POA: Diagnosis present

## 2015-02-04 DIAGNOSIS — Y95 Nosocomial condition: Secondary | ICD-10-CM | POA: Diagnosis present

## 2015-02-04 DIAGNOSIS — Z794 Long term (current) use of insulin: Secondary | ICD-10-CM

## 2015-02-04 DIAGNOSIS — Z87891 Personal history of nicotine dependence: Secondary | ICD-10-CM

## 2015-02-04 DIAGNOSIS — T451X1A Poisoning by antineoplastic and immunosuppressive drugs, accidental (unintentional), initial encounter: Secondary | ICD-10-CM

## 2015-02-04 LAB — COMPREHENSIVE METABOLIC PANEL
ALBUMIN: 2.6 g/dL — AB (ref 3.5–5.0)
ALT: 22 U/L (ref 17–63)
ANION GAP: 11 (ref 5–15)
AST: 35 U/L (ref 15–41)
Alkaline Phosphatase: 104 U/L (ref 38–126)
BUN: 21 mg/dL — AB (ref 6–20)
CALCIUM: 7.9 mg/dL — AB (ref 8.9–10.3)
CO2: 22 mmol/L (ref 22–32)
CREATININE: 1.07 mg/dL (ref 0.61–1.24)
Chloride: 102 mmol/L (ref 101–111)
GFR calc Af Amer: >60 mL/min (ref >60)
Glucose, Bld: 417 mg/dL — ABNORMAL HIGH (ref 65–99)
Potassium: 4.6 mmol/L (ref 3.5–5.1)
Sodium: 135 mmol/L (ref 135–145)
Total Bilirubin: 0.8 mg/dL (ref 0.3–1.2)
Total Protein: 5.2 g/dL — ABNORMAL LOW (ref 6.5–8.1)

## 2015-02-04 LAB — CBC
HEMATOCRIT: 31.8 % — AB (ref 39.0–52.0)
Hemoglobin: 10.2 g/dL — ABNORMAL LOW (ref 13.0–17.0)
MCH: 25.9 pg — AB (ref 26.0–34.0)
MCHC: 32.1 g/dL (ref 30.0–36.0)
MCV: 80.7 fL (ref 78.0–100.0)
Platelets: 258 10*3/uL (ref 150–400)
RBC: 3.94 MIL/uL — AB (ref 4.22–5.81)
RDW: 19.8 % — ABNORMAL HIGH (ref 11.5–15.5)
WBC: 11.2 10*3/uL — AB (ref 4.0–10.5)

## 2015-02-04 LAB — I-STAT CG4 LACTIC ACID, ED
Lactic Acid, Venous: 3.89 mmol/L (ref 0.5–2.0)
Lactic Acid, Venous: 2.33 mmol/L (ref 0.5–2.0)

## 2015-02-04 MED ORDER — SODIUM CHLORIDE 0.9 % IV BOLUS (SEPSIS)
1000.0000 mL | Freq: Once | INTRAVENOUS | Status: AC
  Administered 2015-02-04: 1000 mL via INTRAVENOUS

## 2015-02-04 MED ORDER — SODIUM CHLORIDE 0.9 % IV BOLUS (SEPSIS)
1000.0000 mL | Freq: Once | INTRAVENOUS | Status: AC
Start: 2015-02-04 — End: 2015-02-04
  Administered 2015-02-04: 1000 mL via INTRAVENOUS

## 2015-02-04 MED ORDER — IOHEXOL 350 MG/ML SOLN
100.0000 mL | Freq: Once | INTRAVENOUS | Status: AC | PRN
  Administered 2015-02-04: 100 mL via INTRAVENOUS

## 2015-02-04 NOTE — ED Notes (Signed)
Pt in stating he went for a follow up yesterday after a biopsy and was told his BP was elevated, pt called by his PCP today and told to come in, pt also c/o cough and congestion recently, green drainage from sinuses, pt states he has had drainage from his biopsy site but was told it was normal yesterday

## 2015-02-04 NOTE — ED Notes (Signed)
Patient no in room, Spoke with nurse first states patient currently in x-ray.

## 2015-02-04 NOTE — ED Provider Notes (Signed)
CSN: [id number]     Arrival date & time 02/04/15  1919 History   First MD Initiated Contact with Patient 02/04/15 2027     Chief Complaint  Patient presents with  . Cough  . Hypertension   Derek Anthony is a 59 y.o. male with a history of a recent diagnosis of dermatomyositis, diabetes, hypertension and GERD who presents to the emergency department after he was directed by his primary care physician due to his elevated blood pressure and heart rate was checked yesterday at his surgery follow-up. The patient also complains of fatigue, dyspnea on exertion, cough, chest and nasal congestion ongoing for the past 3 months, since after his most recent admission. He reports this has worsened about the last 2 weeks. He was started on methotrexate one week ago and has had two injections. Patient had left thigh muscle biopsy last month which confirmed his diagnosis of dermatomyositis. Patient denies chest pain, palpitations, headache, lightheadedness, dizziness, leg pain, leg swelling, fevers, chills or rashes.  (Consider location/radiation/quality/duration/timing/severity/associated sxs/prior Treatment) HPI  Past Medical History  Diagnosis Date  . Diabetes mellitus without complication   . Hypertension   . GERD (gastroesophageal reflux disease)   . Anemia   . Sickle cell trait   . Gout   . Wears glasses   . Muscle pain   . Dermatomyositis    Past Surgical History  Procedure Laterality Date  . Shoulder surgery    . Knee reconstruction Right   . Vasectomy    . Muscle biopsy Left 12/23/2014    Procedure: MUSCLE BIOPSY LEFT THIGH;  Surgeon: Claud Kelp, MD;  Location: [facility];  Service: General;  Laterality: Left;   Family History  Problem Relation Age of Onset  . Hypertension Mother   . Diabetes Mother   . Hypertension Father    History  Substance Use Topics  . Smoking status: Former Smoker -- 1.00 packs/day for 20 years    Types: Cigarettes    Quit date: 10/29/1992   . Smokeless tobacco: Not on file  . Alcohol Use: No    Review of Systems  Constitutional: Positive for fatigue. Negative for fever and chills.  HENT: Positive for congestion and rhinorrhea. Negative for ear pain, hearing loss, sore throat and trouble swallowing.   Eyes: Negative for pain and visual disturbance.  Respiratory: Positive for cough and shortness of breath. Negative for wheezing.   Cardiovascular: Negative for chest pain, palpitations and leg swelling.  Gastrointestinal: Negative for nausea, vomiting, abdominal pain and diarrhea.  Genitourinary: Negative for dysuria.  Musculoskeletal: Negative for back pain and neck pain.  Skin: Negative for rash.  Neurological: Negative for dizziness, light-headedness and headaches.      Allergies  Sulfa antibiotics and Penicillins  Home Medications   Prior to Admission medications   Medication Sig Start Date End Date Taking? Authorizing Provider  allopurinol (ZYLOPRIM) 300 MG tablet Take 300 mg by mouth daily.   Yes Historical Provider, MD  aspirin EC 81 MG tablet Take 81 mg by mouth daily.   Yes Historical Provider, MD  cetirizine (ZYRTEC) 10 MG tablet Take 10 mg by mouth every morning.    Yes Historical Provider, MD  fluticasone (FLONASE) 50 MCG/ACT nasal spray Place 2 sprays into both nostrils daily.   Yes Historical Provider, MD  fluticasone (FLOVENT HFA) 44 MCG/ACT inhaler Inhale 1 puff into the lungs 2 (two) times daily.   Yes Historical Provider, MD  folic acid (FOLVITE) 1 MG tablet Take 1 mg  by mouth daily.   Yes Historical Provider, MD  gabapentin (NEURONTIN) 600 MG tablet Take 600-1,200 mg by mouth 2 (two) times daily. TAKES  IN AM AND  IN PM   Yes Historical Provider, MD  HYDROcodone-acetaminophen (NORCO) 5-325 MG per tablet Take 1-2 tablets by mouth every 6 (six) hours as needed for moderate pain or severe pain. 12/23/14  Yes Claud Kelp, MD  insulin aspart protamine- aspart (NOVOLOG MIX 70/30) (70-30) 100  UNIT/ML injection Inject 40 Units into the skin 2 (two) times daily.    Yes Historical Provider, MD  insulin glargine (LANTUS) 100 UNIT/ML injection Inject 0.4 mLs (40 Units total) into the skin at bedtime. Patient taking differently: Inject 35 Units into the skin 2 (two) times daily.  11/05/14  Yes Christiane Ha, MD  meloxicam (MOBIC) 7.5 MG tablet Take 1 tablet (7.5 mg total) by mouth daily. 11/05/14  Yes Christiane Ha, MD  METHOTREXATE SODIUM IJ Inject 0.6 mLs as directed every Monday.   Yes Historical Provider, MD  montelukast (SINGULAIR) 10 MG tablet Take 10 mg by mouth at bedtime.   Yes Historical Provider, MD  Naphazoline-Pheniramine (OPCON-A) 0.027-0.315 % SOLN Apply 1 drop to eye daily as needed (FOR DRY EYES).   Yes Historical Provider, MD  olmesartan (BENICAR) 40 MG tablet Take 40 mg by mouth daily.   Yes Historical Provider, MD  omeprazole (PRILOSEC) 20 MG capsule Take 20 mg by mouth daily.   Yes Historical Provider, MD  predniSONE (DELTASONE) 20 MG tablet Take 20-40 mg by mouth daily with breakfast. TAKES  IN AM AND  IN PM   Yes Historical Provider, MD  traMADol (ULTRAM) 50 MG tablet Take 50 mg by mouth every 6 (six) hours as needed for moderate pain.   Yes Historical Provider, MD   BP 178/86 mmHg  Pulse 98  Temp(Src) 98.2 F (36.8 C) (Oral)  Resp 20  Ht  (1.854 m)  Wt 222 lb (100.699 kg)  BMI 29.30 kg/m2  SpO2 95% Physical Exam  Constitutional: He appears well-developed and well-nourished. No distress.  Nontoxic appearing.  HENT:  Head: Normocephalic and atraumatic.  Right Ear: External ear normal.  Left Ear: External ear normal.  Nose: Nose normal.  Mouth/Throat: Oropharynx is clear and moist. No oropharyngeal exudate.  Eyes: Conjunctivae are normal. Pupils are equal, round, and reactive to light. Right eye exhibits no discharge. Left eye exhibits no discharge.  Neck: Neck supple. No JVD present. No tracheal deviation present.  Cardiovascular:  Regular rhythm, normal heart sounds and intact distal pulses.  Exam reveals no gallop and no friction rub.   No murmur heard. Bilateral radial, posterior tibialis and dorsalis pedis pulses are intact.   Tachycardic at 108  Pulmonary/Chest: Effort normal. No respiratory distress. He has no wheezes. He has rales.  Diminished lung sounds in right upper lobe with rales.   Abdominal: Soft. He exhibits no distension. There is no tenderness.  Musculoskeletal: He exhibits no edema or tenderness.  No lower extremity edema or tenderness.  Lymphadenopathy:    He has no cervical adenopathy.  Neurological: He is alert. Coordination normal.  Skin: Skin is warm and dry. No rash noted. He is not diaphoretic. No erythema. No pallor.  Psychiatric: He has a normal mood and affect. His behavior is normal.  Nursing note and vitals reviewed.   ED Course  Procedures (including critical care time) Labs Review Labs Reviewed  COMPREHENSIVE METABOLIC PANEL - Abnormal; Notable for the following:    Glucose,  Bld 417 (*)    BUN 21 (*)    Calcium 7.9 (*)    Total Protein 5.2 (*)    Albumin 2.6 (*)    All other components within normal limits  CBC - Abnormal; Notable for the following:    WBC 11.2 (*)    RBC 3.94 (*)    Hemoglobin 10.2 (*)    HCT 31.8 (*)    MCH 25.9 (*)    RDW 19.8 (*)    All other components within normal limits  I-STAT CG4 LACTIC ACID, ED - Abnormal; Notable for the following:    Lactic Acid, Venous 3.89 (*)    All other components within normal limits  I-STAT CG4 LACTIC ACID, ED - Abnormal; Notable for the following:    Lactic Acid, Venous 2.33 (*)    All other components within normal limits  CULTURE, BLOOD (ROUTINE X 2)  CULTURE, BLOOD (ROUTINE X 2)  CULTURE, EXPECTORATED SPUTUM-ASSESSMENT  GRAM STAIN  HIV ANTIBODY (ROUTINE TESTING)  LEGIONELLA ANTIGEN, URINE  STREP PNEUMONIAE URINARY ANTIGEN    Imaging Review Dg Chest 2 View  02/04/2015   CLINICAL DATA:  Shortness of  breath and cough for 2 days  EXAM: CHEST  2 VIEW  COMPARISON:  November 02, 2014  FINDINGS: There is patchy infiltrate in both upper lobes and more subtly in the left lower lobe. Heart size and pulmonary vascularity are normal. No adenopathy. No bone lesions.  IMPRESSION: Areas of infiltrate bilaterally, more severe in the right upper lobe than elsewhere. Followup PA and lateral chest radiographs recommended in 3-4 weeks following trial of antibiotic therapy to ensure resolution and exclude underlying malignancy.   Electronically Signed   By: Bretta Bang III M.D.   On: 02/04/2015 20:51   Ct Angio Chest Pe W/cm &/or Wo Cm  02/05/2015   CLINICAL DATA:  Chronic shortness of breath and weakness. Initial encounter.  EXAM: CT ANGIOGRAPHY CHEST WITH CONTRAST  TECHNIQUE: Multidetector CT imaging of the chest was performed using the standard protocol during bolus administration of intravenous contrast. Multiplanar CT image reconstructions and MIPs were obtained to evaluate the vascular anatomy.  CONTRAST:  OMNIPAQUE IOHEXOL 350 MG/ML SOLN  COMPARISON:  Chest radiograph performed earlier today at 8:46 p.m., and CT of the chest performed 10/26/2014  FINDINGS: There is no evidence of pulmonary embolus.  There is diffuse patchy ground-glass airspace opacification throughout both lungs, most prominent at the upper lung lobes. This may reflect pneumonia or an unusual appearance to pulmonary edema. Atypical infection cannot be excluded. There is only minimal crazy paving, suggesting against pulmonary alveolar proteinosis or hemorrhage.  There is no evidence of pleural effusion or pneumothorax. No definite masses are identified; no abnormal focal contrast enhancement is seen.  Scattered coronary artery calcifications are seen. The mediastinum is otherwise unremarkable. No mediastinal lymphadenopathy is seen. No pericardial effusion is identified. The great vessels are grossly unremarkable in appearance. No axillary  lymphadenopathy is seen. The visualized portions of the thyroid gland are unremarkable in appearance.  A calcified granuloma is noted within the right hepatic lobe. The visualized portions of the liver and spleen are otherwise unremarkable. The gallbladder and visualized portions of the pancreas are within normal limits.  No acute osseous abnormalities are seen.  Review of the MIP images confirms the above findings.  IMPRESSION: 1. No evidence of pulmonary embolus. 2. Diffuse patchy ground-glass airspace opacification throughout both lungs, most prominent at the upper lung lobes. This may reflect pneumonia or an  unusual appearance to pulmonary edema. Atypical infection cannot be excluded. Only minimal crazy paving is noted, suggesting against pulmonary alveolar proteinosis or hemorrhage. 3. Scattered coronary artery calcifications seen.   Electronically Signed   By: Roanna Raider M.D.   On: 02/05/2015 00:39     EKG Interpretation   Date/Time:  Tuesday February 04 2015 20:53:47 EDT Ventricular Rate:  106 PR Interval:  143 QRS Duration: 82 QT Interval:  333 QTC Calculation: 442 R Axis:   2 Text Interpretation:  Sinus tachycardia Low voltage, extremity and  precordial leads Abnormal R-wave progression, early transition Baseline  wander in lead(s) V1 tachycardia new since previous  Confirmed by YAO  MD,  DAVID ([id number]) on 02/04/2015 10:42:25 PM      Filed Vitals:   02/05/15 0200 02/05/15 0215 02/05/15 0227 02/05/15 0230  BP: 175/85 175/88  178/86  Pulse: 95 100  98  Temp:      TempSrc:      Resp: 15 16  20   Height:      Weight:      SpO2: 95% 92% 95%      MDM   Final diagnoses:  Healthcare-associated pneumonia  Dermatomyositis   This is a 59 y.o. male with a history of a recent diagnosis of dermatomyositis, diabetes, hypertension and GERD who presents to the emergency department after he was directed by his primary care physician due to his elevated blood pressure and heart rate was  checked yesterday at his surgery follow-up. The patient also complains of fatigue, dyspnea on exertion, cough, chest and nasal congestion ongoing for the past 3 months, since after his most recent admission. He reports this has worsened about the last 2 weeks. On exam patient is afebrile and nontoxic-appearing. Patient has diminished lung sounds in his right upper lobe with rales. Patient's initial lactic acid is 3.89. His CMP indicated a glucose of 417 and a normal anion gap. CBC negative except as of 11.2 and hemoglobin is stable at 10.2. Chest x-ray returned showing bilateral areas of infiltrate that is worse in his right upper lobe.  As the patient is afebrile and his had a cough ongoing for the past 3 months it was decided, after consulting with Dr. Silverio Lay, to wait to treat him for pneumonia until we obtained a CT angio of his chest to rule out PE.  CT angiogram of his chest was obtained which showed no evidence of PE. It did show diffuse patchy groundglass airspace opacification throughout both lungs which is most prominent in the upper lobes. Radiology suspects this may reflect pneumonia. Will start patient on treatment for healthcare acquired pneumonia and consult for admission. This patient was accepted for admission by Dr. Julian Reil. The patient is in agreement with admission.  This patient was discussed with and evaluated by Dr. Silverio Lay who agrees with assessment and plan.     Everlene Farrier, PA-C 02/05/15 0245  Richardean Canal, MD 02/06/15 [phone]

## 2015-02-04 NOTE — ED Notes (Signed)
Patient still in xray  

## 2015-02-05 ENCOUNTER — Encounter (HOSPITAL_COMMUNITY): Payer: Self-pay

## 2015-02-05 DIAGNOSIS — M109 Gout, unspecified: Secondary | ICD-10-CM | POA: Diagnosis present

## 2015-02-05 DIAGNOSIS — R918 Other nonspecific abnormal finding of lung field: Secondary | ICD-10-CM | POA: Diagnosis not present

## 2015-02-05 DIAGNOSIS — Y95 Nosocomial condition: Secondary | ICD-10-CM | POA: Diagnosis present

## 2015-02-05 DIAGNOSIS — J9601 Acute respiratory failure with hypoxia: Secondary | ICD-10-CM | POA: Diagnosis not present

## 2015-02-05 DIAGNOSIS — E119 Type 2 diabetes mellitus without complications: Secondary | ICD-10-CM | POA: Diagnosis present

## 2015-02-05 DIAGNOSIS — T451X1D Poisoning by antineoplastic and immunosuppressive drugs, accidental (unintentional), subsequent encounter: Secondary | ICD-10-CM | POA: Diagnosis not present

## 2015-02-05 DIAGNOSIS — M3390 Dermatopolymyositis, unspecified, organ involvement unspecified: Secondary | ICD-10-CM | POA: Diagnosis present

## 2015-02-05 DIAGNOSIS — R05 Cough: Secondary | ICD-10-CM | POA: Diagnosis not present

## 2015-02-05 DIAGNOSIS — Z7982 Long term (current) use of aspirin: Secondary | ICD-10-CM | POA: Diagnosis not present

## 2015-02-05 DIAGNOSIS — E1165 Type 2 diabetes mellitus with hyperglycemia: Secondary | ICD-10-CM | POA: Diagnosis present

## 2015-02-05 DIAGNOSIS — T451X1A Poisoning by antineoplastic and immunosuppressive drugs, accidental (unintentional), initial encounter: Secondary | ICD-10-CM | POA: Diagnosis not present

## 2015-02-05 DIAGNOSIS — I10 Essential (primary) hypertension: Secondary | ICD-10-CM | POA: Diagnosis present

## 2015-02-05 DIAGNOSIS — J939 Pneumothorax, unspecified: Secondary | ICD-10-CM | POA: Diagnosis not present

## 2015-02-05 DIAGNOSIS — D573 Sickle-cell trait: Secondary | ICD-10-CM | POA: Diagnosis present

## 2015-02-05 DIAGNOSIS — E11649 Type 2 diabetes mellitus with hypoglycemia without coma: Secondary | ICD-10-CM | POA: Diagnosis present

## 2015-02-05 DIAGNOSIS — R0609 Other forms of dyspnea: Secondary | ICD-10-CM

## 2015-02-05 DIAGNOSIS — Z87891 Personal history of nicotine dependence: Secondary | ICD-10-CM | POA: Diagnosis not present

## 2015-02-05 DIAGNOSIS — I959 Hypotension, unspecified: Secondary | ICD-10-CM | POA: Diagnosis not present

## 2015-02-05 DIAGNOSIS — J9600 Acute respiratory failure, unspecified whether with hypoxia or hypercapnia: Secondary | ICD-10-CM | POA: Diagnosis not present

## 2015-02-05 DIAGNOSIS — E1140 Type 2 diabetes mellitus with diabetic neuropathy, unspecified: Secondary | ICD-10-CM | POA: Diagnosis present

## 2015-02-05 DIAGNOSIS — J309 Allergic rhinitis, unspecified: Secondary | ICD-10-CM | POA: Diagnosis present

## 2015-02-05 DIAGNOSIS — J80 Acute respiratory distress syndrome: Secondary | ICD-10-CM | POA: Diagnosis not present

## 2015-02-05 DIAGNOSIS — J9691 Respiratory failure, unspecified with hypoxia: Secondary | ICD-10-CM | POA: Diagnosis present

## 2015-02-05 DIAGNOSIS — M3391 Dermatopolymyositis, unspecified with respiratory involvement: Secondary | ICD-10-CM | POA: Diagnosis present

## 2015-02-05 DIAGNOSIS — K219 Gastro-esophageal reflux disease without esophagitis: Secondary | ICD-10-CM | POA: Diagnosis present

## 2015-02-05 DIAGNOSIS — I952 Hypotension due to drugs: Secondary | ICD-10-CM | POA: Diagnosis not present

## 2015-02-05 DIAGNOSIS — Z452 Encounter for adjustment and management of vascular access device: Secondary | ICD-10-CM | POA: Diagnosis not present

## 2015-02-05 DIAGNOSIS — Z7952 Long term (current) use of systemic steroids: Secondary | ICD-10-CM | POA: Diagnosis not present

## 2015-02-05 DIAGNOSIS — Z794 Long term (current) use of insulin: Secondary | ICD-10-CM | POA: Diagnosis not present

## 2015-02-05 DIAGNOSIS — J189 Pneumonia, unspecified organism: Secondary | ICD-10-CM | POA: Diagnosis present

## 2015-02-05 DIAGNOSIS — I9589 Other hypotension: Secondary | ICD-10-CM | POA: Diagnosis not present

## 2015-02-05 LAB — GLUCOSE, CAPILLARY
GLUCOSE-CAPILLARY: 82 mg/dL (ref 65–99)
Glucose-Capillary: 119 mg/dL — ABNORMAL HIGH (ref 65–99)
Glucose-Capillary: 75 mg/dL (ref 65–99)
Glucose-Capillary: 216 mg/dL — ABNORMAL HIGH (ref 65–99)
Glucose-Capillary: 193 mg/dL — ABNORMAL HIGH (ref 65–99)

## 2015-02-05 LAB — HIV ANTIBODY (ROUTINE TESTING W REFLEX): HIV Screen 4th Generation wRfx: NONREACTIVE

## 2015-02-05 LAB — PROCALCITONIN

## 2015-02-05 LAB — STREP PNEUMONIAE URINARY ANTIGEN: Strep Pneumo Urinary Antigen: NEGATIVE

## 2015-02-05 MED ORDER — TRAMADOL HCL 50 MG PO TABS
50.0000 mg | ORAL_TABLET | Freq: Four times a day (QID) | ORAL | Status: DC | PRN
  Administered 2015-02-06 – 2015-02-08 (×7): 50 mg via ORAL
  Filled 2015-02-05 (×7): qty 1

## 2015-02-05 MED ORDER — HYDROCODONE-ACETAMINOPHEN 5-325 MG PO TABS
1.0000 | ORAL_TABLET | Freq: Four times a day (QID) | ORAL | Status: DC | PRN
  Administered 2015-02-07 – 2015-02-12 (×16): 2 via ORAL
  Administered 2015-02-12: 1 via ORAL
  Administered 2015-02-12 – 2015-02-13 (×4): 2 via ORAL
  Filled 2015-02-05 (×22): qty 2

## 2015-02-05 MED ORDER — MORPHINE SULFATE 4 MG/ML IJ SOLN
4.0000 mg | Freq: Once | INTRAMUSCULAR | Status: AC
  Administered 2015-02-05: 4 mg via INTRAVENOUS
  Filled 2015-02-05: qty 1

## 2015-02-05 MED ORDER — MELOXICAM 7.5 MG PO TABS
7.5000 mg | ORAL_TABLET | Freq: Every day | ORAL | Status: DC
  Administered 2015-02-05 – 2015-02-13 (×8): 7.5 mg via ORAL
  Filled 2015-02-05 (×9): qty 1

## 2015-02-05 MED ORDER — GABAPENTIN 600 MG PO TABS
600.0000 mg | ORAL_TABLET | Freq: Every day | ORAL | Status: DC
  Administered 2015-02-05 – 2015-02-12 (×8): 600 mg via ORAL
  Filled 2015-02-05 (×9): qty 1

## 2015-02-05 MED ORDER — IRBESARTAN 300 MG PO TABS
300.0000 mg | ORAL_TABLET | Freq: Every day | ORAL | Status: DC
  Administered 2015-02-05 – 2015-02-13 (×9): 300 mg via ORAL
  Filled 2015-02-05 (×9): qty 1

## 2015-02-05 MED ORDER — ALLOPURINOL 300 MG PO TABS
300.0000 mg | ORAL_TABLET | Freq: Every day | ORAL | Status: DC
Start: 2015-02-05 — End: 2015-02-13
  Administered 2015-02-05 – 2015-02-13 (×9): 300 mg via ORAL
  Filled 2015-02-05 (×9): qty 1

## 2015-02-05 MED ORDER — FLUTICASONE PROPIONATE 50 MCG/ACT NA SUSP
2.0000 | Freq: Every day | NASAL | Status: DC
  Administered 2015-02-05 – 2015-02-13 (×8): 2 via NASAL
  Filled 2015-02-05 (×2): qty 16

## 2015-02-05 MED ORDER — VANCOMYCIN HCL 10 G IV SOLR
2000.0000 mg | INTRAVENOUS | Status: AC
  Administered 2015-02-05: 2000 mg via INTRAVENOUS
  Filled 2015-02-05: qty 2000

## 2015-02-05 MED ORDER — ASPIRIN EC 81 MG PO TBEC
81.0000 mg | DELAYED_RELEASE_TABLET | Freq: Every day | ORAL | Status: DC
  Administered 2015-02-05 – 2015-02-13 (×9): 81 mg via ORAL
  Filled 2015-02-05 (×9): qty 1

## 2015-02-05 MED ORDER — DEXTROSE 5 % IV SOLN
2.0000 g | Freq: Once | INTRAVENOUS | Status: AC
  Filled 2015-02-05: qty 2

## 2015-02-05 MED ORDER — GABAPENTIN 600 MG PO TABS: 600.0000 mg | ORAL_TABLET | Freq: Two times a day (BID) | ORAL | Status: DC

## 2015-02-05 MED ORDER — BUDESONIDE 0.25 MG/2ML IN SUSP
0.2500 mg | Freq: Two times a day (BID) | RESPIRATORY_TRACT | Status: DC
  Administered 2015-02-05 – 2015-02-12 (×14): 0.25 mg via RESPIRATORY_TRACT
  Filled 2015-02-05 (×18): qty 2

## 2015-02-05 MED ORDER — VANCOMYCIN HCL IN DEXTROSE 1-5 GM/200ML-% IV SOLN: 1000.0000 mg | Freq: Two times a day (BID) | INTRAVENOUS | Status: DC

## 2015-02-05 MED ORDER — PANTOPRAZOLE SODIUM 40 MG PO TBEC
40.0000 mg | DELAYED_RELEASE_TABLET | Freq: Every day | ORAL | Status: DC
  Administered 2015-02-05 – 2015-02-13 (×8): 40 mg via ORAL
  Filled 2015-02-05 (×6): qty 1

## 2015-02-05 MED ORDER — PREDNISONE 20 MG PO TABS
20.0000 mg | ORAL_TABLET | Freq: Every day | ORAL | Status: DC
  Filled 2015-02-05 (×2): qty 2

## 2015-02-05 MED ORDER — DEXTROSE 5 % IV SOLN
1.0000 g | Freq: Three times a day (TID) | INTRAVENOUS | Status: DC
  Filled 2015-02-05: qty 1

## 2015-02-05 MED ORDER — ONDANSETRON HCL 4 MG/2ML IJ SOLN: 4.0000 mg | Freq: Once | INTRAMUSCULAR | Status: DC

## 2015-02-05 MED ORDER — AZITHROMYCIN 500 MG IV SOLR
500.0000 mg | INTRAVENOUS | Status: DC
  Administered 2015-02-05: 500 mg via INTRAVENOUS
  Filled 2015-02-05: qty 500

## 2015-02-05 MED ORDER — PREDNISONE 20 MG PO TABS
20.0000 mg | ORAL_TABLET | Freq: Every day | ORAL | Status: DC
  Administered 2015-02-05 – 2015-02-07 (×3): 20 mg via ORAL
  Filled 2015-02-05 (×4): qty 1

## 2015-02-05 MED ORDER — PREDNISONE 20 MG PO TABS
40.0000 mg | ORAL_TABLET | Freq: Every day | ORAL | Status: DC
  Administered 2015-02-05 – 2015-02-08 (×4): 40 mg via ORAL
  Filled 2015-02-05 (×6): qty 2

## 2015-02-05 MED ORDER — INSULIN ASPART PROT & ASPART (70-30 MIX) 100 UNIT/ML ~~LOC~~ SUSP
40.0000 [IU] | Freq: Two times a day (BID) | SUBCUTANEOUS | Status: DC
  Administered 2015-02-05 – 2015-02-12 (×15): 40 [IU] via SUBCUTANEOUS
  Filled 2015-02-05: qty 10

## 2015-02-05 MED ORDER — DOXYCYCLINE HYCLATE 100 MG PO TABS
100.0000 mg | ORAL_TABLET | Freq: Two times a day (BID) | ORAL | Status: DC
  Administered 2015-02-05 – 2015-02-07 (×6): 100 mg via ORAL
  Filled 2015-02-05 (×10): qty 1

## 2015-02-05 MED ORDER — GABAPENTIN 600 MG PO TABS
1200.0000 mg | ORAL_TABLET | Freq: Every day | ORAL | Status: DC
  Administered 2015-02-05 – 2015-02-13 (×9): 1200 mg via ORAL
  Filled 2015-02-05 (×9): qty 2

## 2015-02-05 MED ORDER — HYDRALAZINE HCL 20 MG/ML IJ SOLN: 10.0000 mg | Freq: Four times a day (QID) | INTRAMUSCULAR | Status: DC | PRN

## 2015-02-05 MED ORDER — INSULIN GLARGINE 100 UNIT/ML ~~LOC~~ SOLN
35.0000 [IU] | Freq: Two times a day (BID) | SUBCUTANEOUS | Status: DC
  Administered 2015-02-05 – 2015-02-09 (×9): 35 [IU] via SUBCUTANEOUS
  Filled 2015-02-05 (×10): qty 0.35

## 2015-02-05 MED ORDER — HEPARIN SODIUM (PORCINE) 5000 UNIT/ML IJ SOLN
5000.0000 [IU] | Freq: Three times a day (TID) | INTRAMUSCULAR | Status: DC
  Administered 2015-02-05 – 2015-02-13 (×26): 5000 [IU] via SUBCUTANEOUS
  Filled 2015-02-05 (×27): qty 1

## 2015-02-05 MED ORDER — MONTELUKAST SODIUM 10 MG PO TABS
10.0000 mg | ORAL_TABLET | Freq: Every day | ORAL | Status: DC
Start: 2015-02-05 — End: 2015-02-13
  Administered 2015-02-05 – 2015-02-12 (×8): 10 mg via ORAL
  Filled 2015-02-05 (×9): qty 1

## 2015-02-05 MED ORDER — FLUTICASONE PROPIONATE HFA 44 MCG/ACT IN AERO: 1.0000 | INHALATION_SPRAY | Freq: Two times a day (BID) | RESPIRATORY_TRACT | Status: DC

## 2015-02-05 MED ORDER — LORATADINE 10 MG PO TABS
10.0000 mg | ORAL_TABLET | Freq: Every day | ORAL | Status: DC
  Administered 2015-02-05 – 2015-02-13 (×8): 10 mg via ORAL
  Filled 2015-02-05 (×9): qty 1

## 2015-02-05 MED ORDER — FOLIC ACID 1 MG PO TABS
1.0000 mg | ORAL_TABLET | Freq: Every day | ORAL | Status: DC
  Administered 2015-02-05 – 2015-02-13 (×8): 1 mg via ORAL
  Filled 2015-02-05 (×9): qty 1

## 2015-02-05 NOTE — Progress Notes (Signed)
Utilization Review completed. Audry Kauzlarich RN BSN CM 

## 2015-02-05 NOTE — Progress Notes (Signed)
Patient seen and examined. Admitted after midnight secondary to progressive SOB and non-productive cough. Patient is afebrile and his WBC's are WNL. Patient chronically on immunosuppressants for dermatomyositis. CXR/CT scan of the chest demonstrating ground glass opacity and new upper lobe infiltrates. Please referred to H&P written by Dr. Julian Reil for further details/info on admission.  Plan: -will consult PCCM -on doxycycline for atypical PNA -might need bronchoscopy -hold MTX -follow clinical response   Derek Anthony [phone]

## 2015-02-05 NOTE — Progress Notes (Signed)
Nutrition Brief Note  Patient identified on the Malnutrition Screening Tool (MST) Report  Wt Readings from Last 15 Encounters:  02/05/15 227 lb 12.8 oz (103.329 kg)  12/23/14 214 lb 4 oz (97.183 kg)  11/02/14 223 lb (101.152 kg)  11/01/14 227 lb 4.7 oz (103.1 kg)  05/21/13 255 lb (115.667 kg)   Derek Anthony is Anthony 59 y.o. male with recent diagnosis of dermatomyositis. Started MTX 1 week ago Monday. Patient presents to ED with 3 month history of ongoing SOB since his discharge from the hospital in March. He has very minimal cough, non-productive. Becomes winded when going up Anthony flight of stairs. He is certain that his SOB symptoms significantly predate the point in time when he started MTX. His symptoms have not worsened or changed since starting MTX  Pt reports he has had Anthony "voracious" appetite since being started on prednisone back in March. Noted wt gain trend. Pt reports he consumed 100% of his breakfast, as well as Anthony [employer] sandwich earlier this morning.   Pt reports he noticed improvements in his muscle tone. Nutrition-Focused physical exam completed. Findings are no fat depletion, mild muscle depletion, and no edema.   Pt declined offer of supplements, reporting he is eating well and can meet his needs through meals. Discussed importance of good meal intake to promote healing.   Body mass index is 30.06 kg/(m^2). Patient meets criteria for obesity, class I based on current BMI.   Current diet order is carb modified, patient is consuming approximately 100% of meals at this time. Labs and medications reviewed.   No nutrition interventions warranted at this time. If nutrition issues arise, please consult RD.   Derek Anthony. Mayford Knife, RD, LDN, CDE Pager: [phone] After hours Pager: [phone]

## 2015-02-05 NOTE — Consult Note (Signed)
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 He reports being SOB(DOE >25 feet) since being discharged hospital in 3/16. Chronic cough with brown sputum, no FCS. No lower ext edema noted, no chest pain or leg pain. His CT of chest is remarkable for bilateral as dz R>L. He has a documented Sickle Cell trait. PMH significant for DM(a1c 8.5 in 3/16), HTN, GERD,anemia, gout and recent dermatomyositis. PCCM asked to evaluate.  PAST MEDICAL HISTORY :   has a past medical history of Diabetes mellitus without complication; Hypertension; GERD (gastroesophageal reflux disease); Anemia; Sickle cell trait; Gout; Wears glasses; Muscle pain; and Dermatomyositis.  has past surgical history that includes Shoulder surgery; Knee reconstruction (Right); Vasectomy; and Muscle biopsy (Left, 12/23/2014). Prior to Admission medications   Medication Sig Start Date End Date Taking? Authorizing Provider  allopurinol (ZYLOPRIM) 300 MG tablet Take 300 mg by mouth daily.   Yes Historical Provider, MD  aspirin EC 81 MG tablet Take 81 mg by mouth daily.   Yes Historical Provider, MD  cetirizine (ZYRTEC) 10 MG tablet Take 10 mg by mouth every morning.    Yes Historical Provider, MD  fluticasone (FLONASE) 50 MCG/ACT nasal spray Place 2 sprays into both nostrils daily.   Yes Historical Provider, MD  fluticasone (FLOVENT HFA) 44 MCG/ACT inhaler Inhale 1 puff into the lungs 2 (two) times daily.   Yes Historical Provider, MD  folic acid (FOLVITE) 1 MG tablet Take  1 mg by mouth daily.   Yes Historical Provider, MD  gabapentin (NEURONTIN) 600 MG tablet Take 600-1,200 mg by mouth 2 (two) times daily. TAKES 1200MG  IN AM AND 600MG  IN PM   Yes Historical Provider, MD  HYDROcodone-acetaminophen (NORCO) 5-325 MG per tablet Take 1-2 tablets by mouth every 6 (six) hours as needed for moderate pain or severe pain. 12/23/14  Yes Claud Kelp, MD  insulin aspart protamine- aspart (NOVOLOG MIX 70/30) (70-30) 100 UNIT/ML injection Inject 40 Units into the skin 2 (two) times daily.    Yes Historical Provider, MD  insulin glargine (LANTUS) 100 UNIT/ML injection Inject 0.4 mLs (40 Units total) into the skin at bedtime. Patient taking differently: Inject 35 Units into the skin 2 (two) times daily.  11/05/14  Yes Christiane Ha, MD  meloxicam (MOBIC) 7.5 MG tablet Take 1 tablet (7.5 mg total) by mouth daily. 11/05/14  Yes Christiane Ha, MD  METHOTREXATE SODIUM IJ Inject 0.6 mLs as directed every Monday.   Yes Historical Provider, MD  montelukast (SINGULAIR) 10 MG tablet Take 10 mg by mouth at bedtime.   Yes Historical Provider, MD  Naphazoline-Pheniramine (OPCON-A) 0.027-0.315 % SOLN Apply 1 drop to eye daily as needed (FOR DRY EYES).   Yes Historical Provider, MD  olmesartan (BENICAR) 40 MG tablet Take 40 mg by mouth daily.   Yes Historical Provider, MD  omeprazole (PRILOSEC) 20 MG capsule Take 20 mg by mouth daily.   Yes Historical Provider, MD  predniSONE (DELTASONE) 20 MG tablet Take 20-40  mg by mouth daily with breakfast. TAKES  IN AM AND  IN PM   Yes Historical Provider, MD  traMADol (ULTRAM) 50 MG tablet Take 50 mg by mouth every 6 (six) hours as needed for moderate pain.   Yes Historical Provider, MD   Allergies  Allergen Reactions  . Sulfa Antibiotics Itching and Rash    Burning also  . Penicillins Itching and Rash    FAMILY HISTORY:  family history includes Diabetes in his mother; Hypertension in his father and mother. SOCIAL HISTORY:  reports  that he quit smoking about 22 years ago. His smoking use included Cigarettes. He has a 20 pack-year smoking history. He does not have any smokeless tobacco history on file. He reports that he does not drink alcohol or use illicit drugs.  REVIEW OF SYSTEMS:   10 point review of system taken, please see HPI for positives and negatives.   SUBJECTIVE:  NAD  VITAL SIGNS: Temp:  [98.2 F (36.8 C)-99.1 F (37.3 C)] 99 F (37.2 C) (06/15 0627) Pulse Rate:  [78-114] 85 (06/15 0627) Resp:  [12-29] 18 (06/15 0627) BP: (141-192)/(79-109) 154/79 mmHg (06/15 0627) SpO2:  [90 %-95 %] 92 % (06/15 0916) FiO2 (%):  [0 %] 0 % (06/15 0227) Weight:  [222 lb (100.699 kg)-227 lb 12.8 oz (103.329 kg)] 227 lb 12.8 oz (103.329 kg) (06/15 0352)  PHYSICAL EXAMINATION: General: WNWDAAM in no acute distress Neuro:  Intact, mae 4, speech clear and articulate HEENT:  NO LAN/JVD, Oral mucosa clear, good dentition  Cardiovascular:  HSR RRR no mumur Lungs:  Diminished in bases, good excursion, scant brown sputum noted Abdomen:  Soft +bs Musculoskeletal:  Intact Skin:  Warm and dry   Recent Labs Lab 02/04/15 1945  NA 135  K 4.6  CL 102  CO2 22  BUN 21*  CREATININE 1.07  GLUCOSE 417*    Recent Labs Lab 02/04/15 1945  HGB 10.2*  HCT 31.8*  WBC 11.2*  PLT 258   Dg Chest 2 View  02/04/2015   CLINICAL DATA:  Shortness of breath and cough for 2 days  EXAM: CHEST  2 VIEW  COMPARISON:  November 02, 2014  FINDINGS: There is patchy infiltrate in both upper lobes and more subtly in the left lower lobe. Heart size and pulmonary vascularity are normal. No adenopathy. No bone lesions.  IMPRESSION: Areas of infiltrate bilaterally, more severe in the right upper lobe than elsewhere. Followup PA and lateral chest radiographs recommended in 3-4 weeks following trial of antibiotic therapy to ensure resolution and exclude underlying malignancy.   Electronically Signed   By: Bretta Bang III M.D.   On: 02/04/2015  20:51   Ct Angio Chest Pe W/cm &/or Wo Cm  02/05/2015   CLINICAL DATA:  Chronic shortness of breath and weakness. Initial encounter.  EXAM: CT ANGIOGRAPHY CHEST WITH CONTRAST  TECHNIQUE: Multidetector CT imaging of the chest was performed using the standard protocol during bolus administration of intravenous contrast. Multiplanar CT image reconstructions and MIPs were obtained to evaluate the vascular anatomy.  CONTRAST:  OMNIPAQUE IOHEXOL 350 MG/ML SOLN  COMPARISON:  Chest radiograph performed earlier today at 8:46 p.m., and CT of the chest performed 10/26/2014  FINDINGS: There is no evidence of pulmonary embolus.  There is diffuse patchy ground-glass airspace opacification throughout both lungs, most prominent at the upper lung lobes. This may reflect pneumonia or an unusual appearance to pulmonary edema. Atypical infection cannot be excluded. There is only minimal crazy paving, suggesting against  pulmonary alveolar proteinosis or hemorrhage.  There is no evidence of pleural effusion or pneumothorax. No definite masses are identified; no abnormal focal contrast enhancement is seen.  Scattered coronary artery calcifications are seen. The mediastinum is otherwise unremarkable. No mediastinal lymphadenopathy is seen. No pericardial effusion is identified. The great vessels are grossly unremarkable in appearance. No axillary lymphadenopathy is seen. The visualized portions of the thyroid gland are unremarkable in appearance.  A calcified granuloma is noted within the right hepatic lobe. The visualized portions of the liver and spleen are otherwise unremarkable. The gallbladder and visualized portions of the pancreas are within normal limits.  No acute osseous abnormalities are seen.  Review of the MIP images confirms the above findings.  IMPRESSION: 1. No evidence of pulmonary embolus. 2. Diffuse patchy ground-glass airspace opacification throughout both lungs, most prominent at the upper lung lobes. This may  reflect pneumonia or an unusual appearance to pulmonary edema. Atypical infection cannot be excluded. Only minimal crazy paving is noted, suggesting against pulmonary alveolar proteinosis or hemorrhage. 3. Scattered coronary artery calcifications seen.   Electronically Signed   By: Roanna Raider M.D.   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 He reports being SOB(DOE >25 feet) since being discharged hospital in 3/16. Chronic cough with brown sputum, no FCS. No lower ext edema noted, no chest pain or leg pain. His CT of chest is remarkable for bilateral as dz R>L. He has a documented Sickle Cell trait. PMH significant for DM(a1c 8.5 in 3/16), HTN, GERD,anemia, gout and recent dermatomyositis. PCCM asked to evaluate.      PLAN  O2 as needed  Empirical abx in immunosuppressed pt.  Check Pro calcitonin and pan cultures  May FOB for further evaluation possible PCP.  Potential to worsen looking at CT chest scan.  HTN, DM, Anemia, GERD, Dermatomyositis per PCP.  Brett Canales Minor ACNP [facility] Pager [phone] till 3 pm If no answer page [phone] 02/05/2015, 11:00 AM     Attending:  I have seen and examined the patient with nurse practitioner/resident and agree with the note above.   I had a lengthy conversation with Derek Anthony today and spoke with his wife as well.  He has had progressive dyspnea since his diagnosis of dermatomyositis.     He has been on immunosuppression since March, most recently methotrexate.  On my exam, some crackles but normal effort.  O2 saturation 93% RA  I have reviewed the CT images from March and from yesterday, there is new bilateral airspace disease in the upper lobes primarily, some sub pleural sparing.    My ddx here: PCP from prednisone and methotrexate vs methotrexate injury vs NSIP from dermatomyositis.    Plan: Bronch tomorrow with BAL for PCP Hold methotrexate, consider alternative immunosupressant WITH PCP PROPHYLAXIS If progressive disease then will need open lung  biopsy  Wife updated  Keep NPO overnight  Heber [place], MD [facility] Pager: [phone] Cell: [phone] After 3pm or if no response, call [phone]

## 2015-02-05 NOTE — H&P (Signed)
[facility] History and Physical  Derek Anthony DVV:[id number] DOB: May 03, 1956 DOA: 02/04/2015  Referring physician: EDP PCP: [name], MD   Chief Complaint: SOB, generalized weakness   HPI: Derek Anthony is a 59 y.o. male with recent diagnosis of dermatomyositis.  Started MTX 1 week ago Monday.  Patient presents to ED with 3 month history of ongoing SOB since his discharge from the hospital in March.  He has very minimal cough, non-productive.  Becomes winded when going up a flight of stairs.  He is certain that his SOB symptoms significantly predate the point in time when he started MTX.  His symptoms have not worsened or changed since starting MTX.  Review of Systems: Systems reviewed.  As above, otherwise negative  Past Medical History  Diagnosis Date  . Diabetes mellitus without complication   . Hypertension   . GERD (gastroesophageal reflux disease)   . Anemia   . Sickle cell trait   . Gout   . Wears glasses   . Muscle pain   . Dermatomyositis    Past Surgical History  Procedure Laterality Date  . Shoulder surgery    . Knee reconstruction Right   . Vasectomy    . Muscle biopsy Left 12/23/2014    Procedure: MUSCLE BIOPSY LEFT THIGH;  Surgeon: Claud Kelp, MD;  Location: [facility];  Service: General;  Laterality: Left;   Social History:  reports that he quit smoking about 22 years ago. His smoking use included Cigarettes. He has a 20 pack-year smoking history. He does not have any smokeless tobacco history on file. He reports that he does not drink alcohol or use illicit drugs.  Allergies  Allergen Reactions  . Sulfa Antibiotics Itching and Rash    Burning also  . Penicillins Itching and Rash    Family History  Problem Relation Age of Onset  . Hypertension Mother   . Diabetes Mother   . Hypertension Father      Prior to Admission medications   Medication Sig Start Date End Date Taking? Authorizing Provider  allopurinol  (ZYLOPRIM) 300 MG tablet Take 300 mg by mouth daily.   Yes Historical Provider, MD  aspirin EC 81 MG tablet Take 81 mg by mouth daily.   Yes Historical Provider, MD  cetirizine (ZYRTEC) 10 MG tablet Take 10 mg by mouth every morning.    Yes Historical Provider, MD  fluticasone (FLONASE) 50 MCG/ACT nasal spray Place 2 sprays into both nostrils daily.   Yes Historical Provider, MD  fluticasone (FLOVENT HFA) 44 MCG/ACT inhaler Inhale 1 puff into the lungs 2 (two) times daily.   Yes Historical Provider, MD  folic acid (FOLVITE) 1 MG tablet Take 1 mg by mouth daily.   Yes Historical Provider, MD  gabapentin (NEURONTIN) 600 MG tablet Take 600-1,200 mg by mouth 2 (two) times daily. TAKES 1200MG  IN AM AND 600MG  IN PM   Yes Historical Provider, MD  HYDROcodone-acetaminophen (NORCO) 5-325 MG per tablet Take 1-2 tablets by mouth every 6 (six) hours as needed for moderate pain or severe pain. 12/23/14  Yes Claud Kelp, MD  insulin aspart protamine- aspart (NOVOLOG MIX 70/30) (70-30) 100 UNIT/ML injection Inject 40 Units into the skin 2 (two) times daily.    Yes Historical Provider, MD  insulin glargine (LANTUS) 100 UNIT/ML injection Inject 0.4 mLs (40 Units total) into the skin at bedtime. Patient taking differently: Inject 35 Units into the skin 2 (two) times daily.  11/05/14  Yes Christiane Ha,  MD  meloxicam (MOBIC) 7.5 MG tablet Take 1 tablet (7.5 mg total) by mouth daily. 11/05/14  Yes Christiane Ha, MD  METHOTREXATE SODIUM IJ Inject 0.6 mLs as directed every Monday.   Yes Historical Provider, MD  montelukast (SINGULAIR) 10 MG tablet Take 10 mg by mouth at bedtime.   Yes Historical Provider, MD  Naphazoline-Pheniramine (OPCON-A) 0.027-0.315 % SOLN Apply 1 drop to eye daily as needed (FOR DRY EYES).   Yes Historical Provider, MD  olmesartan (BENICAR) 40 MG tablet Take 40 mg by mouth daily.   Yes Historical Provider, MD  omeprazole (PRILOSEC) 20 MG capsule Take 20 mg by mouth daily.   Yes Historical  Provider, MD  predniSONE (DELTASONE) 20 MG tablet Take 20-40 mg by mouth daily with breakfast. TAKES  IN AM AND  IN PM   Yes Historical Provider, MD  traMADol (ULTRAM) 50 MG tablet Take 50 mg by mouth every 6 (six) hours as needed for moderate pain.   Yes Historical Provider, MD   Physical Exam: Filed Vitals:   02/05/15 0200  BP: 175/85  Pulse: 95  Temp:   Resp: 15    BP 175/85 mmHg  Pulse 95  Temp(Src) 98.2 F (36.8 C) (Oral)  Resp 15  Ht  (1.854 m)  Wt 100.699 kg (222 lb)  BMI 29.30 kg/m2  SpO2 95%  General Appearance:    Alert, oriented, no distress, appears stated age  Head:    Normocephalic, atraumatic  Eyes:    PERRL, EOMI, sclera non-icteric        Nose:   Nares without drainage or epistaxis. Mucosa, turbinates normal  Throat:   Moist mucous membranes. Oropharynx without erythema or exudate.  Neck:   Supple. No carotid bruits.  No thyromegaly.  No lymphadenopathy.   Back:     No CVA tenderness, no spinal tenderness  Lungs:     Clear to auscultation bilaterally, without wheezes, rhonchi or rales  Chest wall:    No tenderness to palpitation  Heart:    Regular rate and rhythm without murmurs, gallops, rubs  Abdomen:     Soft, non-tender, nondistended, normal bowel sounds, no organomegaly  Genitalia:    deferred  Rectal:    deferred  Extremities:   No clubbing, cyanosis or edema.  Pulses:   2+ and symmetric all extremities  Skin:   Skin color, texture, turgor normal, no rashes or lesions  Lymph nodes:   Cervical, supraclavicular, and axillary nodes normal  Neurologic:   CNII-XII intact. Normal strength, sensation and reflexes      throughout    Labs on Admission:  Basic Metabolic Panel:  Recent Labs Lab 02/04/15 1945  NA 135  K 4.6  CL 102  CO2 22  GLUCOSE 417*  BUN 21*  CREATININE 1.07  CALCIUM 7.9*   Liver Function Tests:  Recent Labs Lab 02/04/15 1945  AST 35  ALT 22  ALKPHOS 104  BILITOT 0.8  PROT 5.2*  ALBUMIN 2.6*   No  results for input(s): LIPASE, AMYLASE in the last 168 hours. No results for input(s): AMMONIA in the last 168 hours. CBC:  Recent Labs Lab 02/04/15 1945  WBC 11.2*  HGB 10.2*  HCT 31.8*  MCV 80.7  PLT 258   Cardiac Enzymes: No results for input(s): CKTOTAL, CKMB, CKMBINDEX, TROPONINI in the last 168 hours.  BNP (last 3 results) No results for input(s): PROBNP in the last 8760 hours. CBG: No results for input(s): GLUCAP in the last 168 hours.  Radiological Exams on Admission: Dg Chest 2 View  02/04/2015   CLINICAL DATA:  Shortness of breath and cough for 2 days  EXAM: CHEST  2 VIEW  COMPARISON:  November 02, 2014  FINDINGS: There is patchy infiltrate in both upper lobes and more subtly in the left lower lobe. Heart size and pulmonary vascularity are normal. No adenopathy. No bone lesions.  IMPRESSION: Areas of infiltrate bilaterally, more severe in the right upper lobe than elsewhere. Followup PA and lateral chest radiographs recommended in 3-4 weeks following trial of antibiotic therapy to ensure resolution and exclude underlying malignancy.   Electronically Signed   By: Bretta Bang III M.D.   On: 02/04/2015 20:51   Ct Angio Chest Pe W/cm &/or Wo Cm  02/05/2015   CLINICAL DATA:  Chronic shortness of breath and weakness. Initial encounter.  EXAM: CT ANGIOGRAPHY CHEST WITH CONTRAST  TECHNIQUE: Multidetector CT imaging of the chest was performed using the standard protocol during bolus administration of intravenous contrast. Multiplanar CT image reconstructions and MIPs were obtained to evaluate the vascular anatomy.  CONTRAST:  OMNIPAQUE IOHEXOL 350 MG/ML SOLN  COMPARISON:  Chest radiograph performed earlier today at 8:46 p.m., and CT of the chest performed 10/26/2014  FINDINGS: There is no evidence of pulmonary embolus.  There is diffuse patchy ground-glass airspace opacification throughout both lungs, most prominent at the upper lung lobes. This may reflect pneumonia or an unusual  appearance to pulmonary edema. Atypical infection cannot be excluded. There is only minimal crazy paving, suggesting against pulmonary alveolar proteinosis or hemorrhage.  There is no evidence of pleural effusion or pneumothorax. No definite masses are identified; no abnormal focal contrast enhancement is seen.  Scattered coronary artery calcifications are seen. The mediastinum is otherwise unremarkable. No mediastinal lymphadenopathy is seen. No pericardial effusion is identified. The great vessels are grossly unremarkable in appearance. No axillary lymphadenopathy is seen. The visualized portions of the thyroid gland are unremarkable in appearance.  A calcified granuloma is noted within the right hepatic lobe. The visualized portions of the liver and spleen are otherwise unremarkable. The gallbladder and visualized portions of the pancreas are within normal limits.  No acute osseous abnormalities are seen.  Review of the MIP images confirms the above findings.  IMPRESSION: 1. No evidence of pulmonary embolus. 2. Diffuse patchy ground-glass airspace opacification throughout both lungs, most prominent at the upper lung lobes. This may reflect pneumonia or an unusual appearance to pulmonary edema. Atypical infection cannot be excluded. Only minimal crazy paving is noted, suggesting against pulmonary alveolar proteinosis or hemorrhage. 3. Scattered coronary artery calcifications seen.   Electronically Signed   By: Roanna Raider M.D.   On: 02/05/2015 00:39    EKG: Independently reviewed.  Assessment/Plan Active Problems:   Cough   Benign essential HTN   DM type 2, goal A1c below 7   Dermatomyositis   Pulmonary infiltrates   1. Pulmonary infiltrates, SOB, cough - doubt this represents HCAP given the 3 month indolent course of symptoms, no fever, no sputum production 1. PNA pathway for cultures and work up 2. Will just put on azithromycin for now for atypical coverage 3. MTX lung toxicity is not  consistent with the timing, his symptoms onset before he started using MTX and have been unchanged since using MTX. 4. May benefit from pulm consult in AM for further diagnosis especially given the atypical presentation 2. Dermatomyositis - continue home prednisone 3. DM2 - 1. continue home lantus and 70/30 2. CBG checks  AC/HS 4. HTN - continue home meds    Code Status: Full Code  Family Communication: No family in room Disposition Plan: Admit to inpatient   Time spent: 70 min  GARDNER, JARED M. [facility] Pager [phone]  If 7AM-7PM, please contact the day team taking care of the patient [email or web address] Password [facility] 02/05/2015, 2:33 AM

## 2015-02-05 NOTE — Progress Notes (Signed)
Received report

## 2015-02-05 NOTE — Progress Notes (Signed)
ANTIBIOTIC CONSULT NOTE - INITIAL  Pharmacy Consult for Vancomycin and Aztreonam Indication: pneumonia  Allergies  Allergen Reactions  . Sulfa Antibiotics Itching and Rash    Burning also  . Penicillins Itching and Rash    Patient Measurements: Height: 6\' 1"  (185.4 cm) Weight: 222 lb (100.699 kg) IBW/kg (Calculated) : 79.9  Vital Signs: Temp: 98.2 F (36.8 C) (06/14 1935) Temp Source: Oral (06/14 1935) BP: 165/84 mmHg (06/14 2330) Pulse Rate: 85 (06/14 2330) Intake/Output from previous day:   Intake/Output from this shift:    Labs:  Recent Labs  02/04/15 1945  WBC 11.2*  HGB 10.2*  PLT 258  CREATININE 1.07   Estimated Creatinine Clearance: 90.4 mL/min (by C-G formula based on Cr of 1.07). No results for input(s): VANCOTROUGH, VANCOPEAK, VANCORANDOM, GENTTROUGH, GENTPEAK, GENTRANDOM, TOBRATROUGH, TOBRAPEAK, TOBRARND, AMIKACINPEAK, AMIKACINTROU, AMIKACIN in the last 72 hours.   Microbiology: No results found for this or any previous visit (from the past 720 hour(s)).  Medical History: Past Medical History  Diagnosis Date  . Diabetes mellitus without complication   . Hypertension   . GERD (gastroesophageal reflux disease)   . Anemia   . Sickle cell trait   . Gout   . Wears glasses   . Muscle pain   . Dermatomyositis     Medications:  See electronic med rec  Assessment: 59 y.o. male presents with cough and hypertension. Noted pt on methotrexate and steroids PTA so immunocompromised. Noted pt with hospitalization April 2016. To begin broad spectrum antibiotics (Vancomycin and Aztreonam) for HCAP. SCr 1.07, est CrCl 90 ml/min. WBC slightly elevated to 11.2. Afeb.  Goal of Therapy:  Vancomycin trough level 15-20 mcg/ml  Plan:  Vancomycin 2gm IV now then 1gm IV q12h Aztreonam 2gm IV now then 1gm IV q8h Will f/u micro data, pt's clinical condition, and renal function Vanc trough prn  Christoper Fabian, PharmD, BCPS Clinical pharmacist, pager  [phone] 02/05/2015,1:00 AM

## 2015-02-05 NOTE — Progress Notes (Signed)
  Pt admitted to the unit. Pt is stable, alert and oriented per baseline. Oriented to room, staff, and call Vana. Educated to call for any assistance. Bed in lowest position, call Reen within reach- will continue to monitor. 

## 2015-02-06 ENCOUNTER — Encounter (HOSPITAL_COMMUNITY)
Admission: EM | Disposition: A | Discharge status: Home / Self Care | Payer: 59 | Source: Home / Self Care | Attending: Internal Medicine

## 2015-02-06 ENCOUNTER — Inpatient Hospital Stay (HOSPITAL_COMMUNITY): Payer: 59

## 2015-02-06 DIAGNOSIS — T451X1D Poisoning by antineoplastic and immunosuppressive drugs, accidental (unintentional), subsequent encounter: Secondary | ICD-10-CM

## 2015-02-06 DIAGNOSIS — J189 Pneumonia, unspecified organism: Secondary | ICD-10-CM

## 2015-02-06 DIAGNOSIS — R0609 Other forms of dyspnea: Secondary | ICD-10-CM

## 2015-02-06 HISTORY — PX: VIDEO BRONCHOSCOPY: SHX5072

## 2015-02-06 LAB — LEGIONELLA ANTIGEN, URINE

## 2015-02-06 LAB — GLUCOSE, CAPILLARY
GLUCOSE-CAPILLARY: 80 mg/dL (ref 65–99)
Glucose-Capillary: 173 mg/dL — ABNORMAL HIGH (ref 65–99)
Glucose-Capillary: 48 mg/dL — ABNORMAL LOW (ref 65–99)
Glucose-Capillary: 81 mg/dL (ref 65–99)
Glucose-Capillary: 71 mg/dL (ref 65–99)
Glucose-Capillary: 135 mg/dL — ABNORMAL HIGH (ref 65–99)
Glucose-Capillary: 183 mg/dL — ABNORMAL HIGH (ref 65–99)

## 2015-02-06 LAB — BODY FLUID CELL COUNT WITH DIFFERENTIAL
EOS FL: 0 %
Lymphs, Fluid: 8 %
Monocyte-Macrophage-Serous Fluid: 65 % (ref 50–90)
Neutrophil Count, Fluid: 27 % — ABNORMAL HIGH (ref 0–25)
Other Cells, Fluid: 0 %
Total Nucleated Cell Count, Fluid: 463 cu mm (ref 0–1000)

## 2015-02-06 LAB — EXPECTORATED SPUTUM ASSESSMENT W GRAM STAIN, RFLX TO RESP C

## 2015-02-06 LAB — STREP PNEUMONIAE URINARY ANTIGEN: STREP PNEUMO URINARY ANTIGEN: NEGATIVE

## 2015-02-06 LAB — EXPECTORATED SPUTUM ASSESSMENT W REFEX TO RESP CULTURE

## 2015-02-06 SURGERY — VIDEO BRONCHOSCOPY WITHOUT FLUORO
Anesthesia: Moderate Sedation | Laterality: Bilateral

## 2015-02-06 MED ORDER — CLONIDINE HCL 0.1 MG PO TABS
0.1000 mg | ORAL_TABLET | Freq: Once | ORAL | Status: AC
  Administered 2015-02-06: 0.1 mg via ORAL
  Filled 2015-02-06: qty 1

## 2015-02-06 MED ORDER — LIDOCAINE HCL (PF) 1 % IJ SOLN
INTRAMUSCULAR | Status: DC | PRN
  Administered 2015-02-06 (×2): 6 mL

## 2015-02-06 MED ORDER — MIDAZOLAM HCL 5 MG/ML IJ SOLN
INTRAMUSCULAR | Status: AC
  Filled 2015-02-06: qty 2

## 2015-02-06 MED ORDER — PHENYLEPHRINE HCL 0.25 % NA SOLN
NASAL | Status: DC | PRN
  Administered 2015-02-06 (×2): 2 via NASAL

## 2015-02-06 MED ORDER — DEXTROSE 50 % IV SOLN
INTRAVENOUS | Status: AC
  Filled 2015-02-06: qty 50

## 2015-02-06 MED ORDER — DEXTROSE-NACL 5-0.45 % IV SOLN
INTRAVENOUS | Status: DC
  Administered 2015-02-06: 09:00:00 via INTRAVENOUS

## 2015-02-06 MED ORDER — MIDAZOLAM HCL 5 MG/ML IJ SOLN
INTRAMUSCULAR | Status: DC | PRN
  Administered 2015-02-06: 2 mg via INTRAVENOUS

## 2015-02-06 MED ORDER — LIDOCAINE HCL 2 % EX GEL
CUTANEOUS | Status: DC | PRN
  Administered 2015-02-06 (×2): 1

## 2015-02-06 MED ORDER — FENTANYL CITRATE (PF) 100 MCG/2ML IJ SOLN
INTRAMUSCULAR | Status: AC
  Filled 2015-02-06: qty 4

## 2015-02-06 MED ORDER — SODIUM CHLORIDE 0.9 % IV SOLN
INTRAVENOUS | Status: DC
  Administered 2015-02-06: 09:00:00 via INTRAVENOUS

## 2015-02-06 MED ORDER — DEXTROSE 50 % IV SOLN
25.0000 mL | Freq: Once | INTRAVENOUS | Status: AC
  Administered 2015-02-06: 25 mL via INTRAVENOUS

## 2015-02-06 MED ORDER — FENTANYL BOLUS VIA INFUSION
INTRAVENOUS | Status: DC | PRN
  Administered 2015-02-06: 50 ug via INTRAVENOUS

## 2015-02-06 MED ORDER — DEXTROSE 50 % IV SOLN
INTRAVENOUS | Status: AC
  Administered 2015-02-06: 08:00:00
  Filled 2015-02-06: qty 50

## 2015-02-06 NOTE — Progress Notes (Signed)
Video Bronchoscopy Performed  Bronchial Washing Done  Pt tolerated well

## 2015-02-06 NOTE — Progress Notes (Signed)
[facility] PROGRESS NOTE  Derek Anthony ZOX:[id number] DOB: Mar 23, 1956 DOA: 02/04/2015 PCP: [name], MD  Assessment/Plan: 1-hypoxemic respiratory failure with worsening infiltrates in a patient with baseline dermatomyositis: No fever, no chest pain. Patient with shortness of breath mainly on x-ray showed and productive cough -CT chest demonstrating groundglass patchy consolidation -Plan is for bronchoscopy today -Follow sputum cultures -Continue doxycycline for atypical infection -Continue supportive care, as needed oxygen supplementation and the use of Pulmicort  2-hypertension: Uncontrolled this morning after no receiving his antihypertensive due to nothing by mouth status for bronchoscopy -Improved after doses of clonidine 1 and resumption of his Avapro -Will monitor and use as needed hydralazine for further blood pressure control  3-diabetes mellitus with neuropathy: On insulin therapy and with hypoglycemia this morning. -Due to nothing by mouth status. -Patient received D50 and his sugar went up to 117 -Will continue the use of 70/30 and Lantus  4-neuropathy: Will continue the use of gabapentin  5-allergic rhinitis: We'll continue the use of Claritin and Flonase  6-dermatomyositis: Continue prednisone. Methotrexate on hold while inpatient  7- GERD/GI protection: Will continue Protonix   Code Status: Full code Family Communication: Wife at bedside Disposition Plan: To be determine; for now remains inpatient   Consultants:  Pulmonary service  Procedures:  See below for x-rays reports  Bronchoscopy planned for 6/16  Antibiotics:  Doxycycline  HPI/Subjective: Afebrile, continued complaining of productive cough and is still feeling short of breath.  Objective: Filed Vitals:   02/06/15 1548  BP: 140/80  Pulse: 102  Temp:   Resp:     Intake/Output Summary (Last 24 hours) at 02/06/15 1622 Last data filed at 02/06/15 1611  Gross per 24  hour  Intake  352.5 ml  Output   2025 ml  Net -1672.5 ml   Filed Weights   02/04/15 1935 02/05/15 0352  Weight: 100.699 kg (222 lb) 103.329 kg (227 lb 12.8 oz)    Exam:   General:  Afebrile, reports shortness of breath unchanged and continuation of productive cough.  Cardiovascular: S1 and S2, no rubs, no gallops  Respiratory: No wheezing, no crackles, positive rhonchi  Abdomen: Soft, nontender, nondistended, positive bowel sounds  Musculoskeletal: No edema, no cyanosis or clubbing.  Data Reviewed: Basic Metabolic Panel:  Recent Labs Lab 02/04/15 1945  NA 135  K 4.6  CL 102  CO2 22  GLUCOSE 417*  BUN 21*  CREATININE 1.07  CALCIUM 7.9*   Liver Function Tests:  Recent Labs Lab 02/04/15 1945  AST 35  ALT 22  ALKPHOS 104  BILITOT 0.8  PROT 5.2*  ALBUMIN 2.6*   CBC:  Recent Labs Lab 02/04/15 1945  WBC 11.2*  HGB 10.2*  HCT 31.8*  MCV 80.7  PLT 258   CBG:  Recent Labs Lab 02/06/15 0800 02/06/15 0820 02/06/15 1011 02/06/15 1108 02/06/15 1319  GLUCAP 48* 173* 81 71 80    Recent Results (from the past 240 hour(s))  Culture, blood (routine x 2)     Status: None (Preliminary result)   Collection Time: 02/04/15  9:00 PM  Result Value Ref Range Status   Specimen Description BLOOD RIGHT ARM  Final   Special Requests BOTTLES DRAWN AEROBIC AND ANAEROBIC 5CC EA  Final   Culture   Final           BLOOD CULTURE RECEIVED NO GROWTH TO DATE CULTURE WILL BE HELD FOR 5 DAYS BEFORE ISSUING A FINAL NEGATIVE REPORT Performed at [facility]    Report  Status PENDING  Incomplete  Culture, blood (routine x 2)     Status: None (Preliminary result)   Collection Time: 02/04/15  9:15 PM  Result Value Ref Range Status   Specimen Description BLOOD LEFT ARM  Final   Special Requests BOTTLES DRAWN AEROBIC AND ANAEROBIC 5CC EA  Final   Culture   Final           BLOOD CULTURE RECEIVED NO GROWTH TO DATE CULTURE WILL BE HELD FOR 5 DAYS BEFORE ISSUING A FINAL  NEGATIVE REPORT Performed at [facility]    Report Status PENDING  Incomplete  Culture, sputum-assessment     Status: None   Collection Time: 02/06/15 11:27 AM  Result Value Ref Range Status   Specimen Description SPUTUM  Final   Special Requests NONE  Final   Sputum evaluation   Final    MICROSCOPIC FINDINGS SUGGEST THAT THIS SPECIMEN IS NOT REPRESENTATIVE OF LOWER RESPIRATORY SECRETIONS. PLEASE RECOLLECT. Results Called to: R L'ESPERANCE,RN AT 1205 02/06/15 BY L BENFIELD    Report Status 02/06/2015 FINAL  Final     Studies: Dg Chest 2 View  02/04/2015   CLINICAL DATA:  Shortness of breath and cough for 2 days  EXAM: CHEST  2 VIEW  COMPARISON:  November 02, 2014  FINDINGS: There is patchy infiltrate in both upper lobes and more subtly in the left lower lobe. Heart size and pulmonary vascularity are normal. No adenopathy. No bone lesions.  IMPRESSION: Areas of infiltrate bilaterally, more severe in the right upper lobe than elsewhere. Followup PA and lateral chest radiographs recommended in 3-4 weeks following trial of antibiotic therapy to ensure resolution and exclude underlying malignancy.   Electronically Signed   By: Bretta Bang III M.D.   On: 02/04/2015 20:51   Ct Angio Chest Pe W/cm &/or Wo Cm  02/05/2015   CLINICAL DATA:  Chronic shortness of breath and weakness. Initial encounter.  EXAM: CT ANGIOGRAPHY CHEST WITH CONTRAST  TECHNIQUE: Multidetector CT imaging of the chest was performed using the standard protocol during bolus administration of intravenous contrast. Multiplanar CT image reconstructions and MIPs were obtained to evaluate the vascular anatomy.  CONTRAST:  OMNIPAQUE IOHEXOL 350 MG/ML SOLN  COMPARISON:  Chest radiograph performed earlier today at 8:46 p.m., and CT of the chest performed 10/26/2014  FINDINGS: There is no evidence of pulmonary embolus.  There is diffuse patchy ground-glass airspace opacification throughout both lungs, most prominent at the  upper lung lobes. This may reflect pneumonia or an unusual appearance to pulmonary edema. Atypical infection cannot be excluded. There is only minimal crazy paving, suggesting against pulmonary alveolar proteinosis or hemorrhage.  There is no evidence of pleural effusion or pneumothorax. No definite masses are identified; no abnormal focal contrast enhancement is seen.  Scattered coronary artery calcifications are seen. The mediastinum is otherwise unremarkable. No mediastinal lymphadenopathy is seen. No pericardial effusion is identified. The great vessels are grossly unremarkable in appearance. No axillary lymphadenopathy is seen. The visualized portions of the thyroid gland are unremarkable in appearance.  A calcified granuloma is noted within the right hepatic lobe. The visualized portions of the liver and spleen are otherwise unremarkable. The gallbladder and visualized portions of the pancreas are within normal limits.  No acute osseous abnormalities are seen.  Review of the MIP images confirms the above findings.  IMPRESSION: 1. No evidence of pulmonary embolus. 2. Diffuse patchy ground-glass airspace opacification throughout both lungs, most prominent at the upper lung lobes. This may reflect  pneumonia or an unusual appearance to pulmonary edema. Atypical infection cannot be excluded. Only minimal crazy paving is noted, suggesting against pulmonary alveolar proteinosis or hemorrhage. 3. Scattered coronary artery calcifications seen.   Electronically Signed   By: Roanna Raider M.D.   On: 02/05/2015 00:39    Scheduled Meds: . allopurinol  300 mg Oral Daily  . aspirin EC  81 mg Oral Daily  . budesonide (PULMICORT) nebulizer solution  0.25 mg Nebulization BID  . doxycycline  100 mg Oral Q12H  . fluticasone  2 spray Each Nare Daily  . folic acid  1 mg Oral Daily  . gabapentin  1,200 mg Oral Daily  . gabapentin  600 mg Oral QHS  . heparin  5,000 Units Subcutaneous 3 times per day  . insulin aspart  protamine- aspart  40 Units Subcutaneous BID WC  . insulin glargine  35 Units Subcutaneous BID  . irbesartan  300 mg Oral Daily  . loratadine  10 mg Oral Daily  . meloxicam  7.5 mg Oral Daily  . montelukast  10 mg Oral QHS  . pantoprazole  40 mg Oral Daily  . predniSONE  20 mg Oral Q supper  . predniSONE  40 mg Oral Q breakfast   Continuous Infusions: . sodium chloride 10 mL/hr at 02/06/15 0903  . dextrose 5 % and 0.45% NaCl 75 mL/hr at 02/06/15 0830    Active Problems:   Cough   Benign essential HTN   DM type 2, goal A1c below 7   Sickle cell trait   Dermatomyositis   Pulmonary infiltrates   Dyspnea on exertion since march 2016   Lung infiltrate on CTm bilateral air space disease   Methotrexate toxicity, possibe afe 2 doses   Time spent: 30 minutes   Vassie Loll  [facility] Pager [phone]. If 7PM-7AM, please contact night-coverage at [email or web address], password [facility] 02/06/2015, 4:22 PM  LOS: 1 day

## 2015-02-06 NOTE — H&P (Signed)
  [facility]  HPI: Derek Anthony has dermatomyositis and a worsening CT scan of the chest.  He is on methotrexate and prednisone.  He came down for a Bronch with BAL earlier but we had to postpone the procedure because his BP was elevated.  Past Medical History  Diagnosis Date  . Diabetes mellitus without complication   . Hypertension   . GERD (gastroesophageal reflux disease)   . Anemia   . Sickle cell trait   . Gout   . Wears glasses   . Muscle pain   . Dermatomyositis      Family History  Problem Relation Age of Onset  . Hypertension Mother   . Diabetes Mother   . Hypertension Father      History   Social History  . Marital Status: Married    Spouse Name: N/A  . Number of Children: N/A  . Years of Education: N/A   Occupational History  . Not on file.   Social History Main Topics  . Smoking status: Former Smoker -- 1.00 packs/day for 20 years    Types: Cigarettes    Quit date: 10/29/1992  . Smokeless tobacco: Not on file  . Alcohol Use: No  . Drug Use: No  . Sexual Activity: Not on file   Other Topics Concern  . Not on file   Social History Narrative     Allergies  Allergen Reactions  . Sulfa Antibiotics Itching and Rash    Burning also  . Penicillins Itching and Rash     @[email or web address] @  Filed Vitals:   02/06/15 0920 02/06/15 1006 02/06/15 1117 02/06/15 1118  BP:  200/98 162/95 157/100  Pulse: 79 82  89  Temp:      TempSrc:      Resp: 15     Height:      Weight:      SpO2: 98%      Gen: well appearing HENT: OP clear, NCAT PULM: Crackles upper lobes, normal effort CV: RRR, no mgr, trace edema GI: BS+, soft, nontender Derm: no cyanosis or rash Psyche: normal mood and affect  CT angiogram of the chest images reviewed again > upper lobe predominant ground glass and patchy consolidation, improves towards bases of lungs, cysts noted which were chronic  Impression/Plan: Hypoxemic respiratory failure with worsening infiltrates with baseline  dermatomyositis.   Bronch with BAL> culture, pcp, cytology, cell count with diff, flow  [name], MD [place] [facility] Pager: [phone] Cell: [phone] After 3pm or if no response, call [phone]

## 2015-02-06 NOTE — Progress Notes (Signed)
Dr. Gwenlyn Perking and [name] made aware of BP 200/98 HR 82, patient denies associated symptoms. Telephone order from [name] to give scheduled avapro and clonodine now and recheck BP in 60-90 mintues, if still elevated to give PRN hydralazine.

## 2015-02-06 NOTE — Progress Notes (Signed)
Dr. Gwenlyn Perking made aware of CBG 71 prior to procedure. Verbal orders received.

## 2015-02-06 NOTE — Progress Notes (Signed)
Hypoglycemic Event  CBG: 48  Treatment: D50 IV 50 mL  Symptoms: Shaky  Follow-up CBG: Time:0820 CBG Result:173  Possible Reasons for Event: Inadequate meal intake, Pt NPO for procedure  Comments/MD notified:Dr. Gwenlyn Perking notified    Lubertha Basque  Remember to initiate Hypoglycemia Order Set & complete

## 2015-02-06 NOTE — Op Note (Signed)
PCCM Bronchoscopy Procedure Note  The patient was informed of the risks (including but not limited to bleeding, infection, respiratory failure, lung injury, tooth/oral injury) and benefits of the procedure and gave consent, see chart.  Indication: Dermatomyositis, worsening infiltrates  Post procedure diagnosis: Dermatomyositis, worsening infiltrates, normal appearing airways  Location: [facility]  Condition pre procedure: Stable  Medications for procedure: Versed 2mg , Fentanyl IV, Topical Lidocaine 1% 8 cc  Procedure description: The bronchoscope was introduced through the nose and passed to the bilateral lungs to the level of the subsegmental bronchi throughout the tracheobronchial tree.  Airway exam revealed normal larynx, normal trachea, sharp carina.  The mucosa of the tracheobronchial tree of both lungs was normal without airway lesion.  Procedures performed:  BAL RUL anterior segment> 100cc sterile saline instilled, 50cc cloudy, non-bloody return  Specimens sent:  Routine Cytology PCP by DFA Bacterial culture Fungal culture AFB culture Cell count with diff Flow cytometry  Condition post procedure: Stable  EBL: none  Complications: None immediate  Heber Greenbrier, MD [place] [facility] Pager: [phone] Cell: [phone] After 3pm or if no response, call [phone]

## 2015-02-06 NOTE — Progress Notes (Signed)
Bronchoscopy rescheduled for 1200 today due to increased BP. Pt will be transferred back to room.  RN called and made aware.  Orders have been put on by MD for BP medication.

## 2015-02-07 ENCOUNTER — Encounter (HOSPITAL_COMMUNITY): Payer: Self-pay | Admitting: Pulmonary Disease

## 2015-02-07 LAB — GLUCOSE, CAPILLARY
Glucose-Capillary: 196 mg/dL — ABNORMAL HIGH (ref 65–99)
Glucose-Capillary: 135 mg/dL — ABNORMAL HIGH (ref 65–99)
Glucose-Capillary: 220 mg/dL — ABNORMAL HIGH (ref 65–99)
Glucose-Capillary: 271 mg/dL — ABNORMAL HIGH (ref 65–99)

## 2015-02-07 LAB — BASIC METABOLIC PANEL
ANION GAP: 10 (ref 5–15)
BUN: 18 mg/dL (ref 6–20)
CHLORIDE: 106 mmol/L (ref 101–111)
CO2: 22 mmol/L (ref 22–32)
CREATININE: 0.97 mg/dL (ref 0.61–1.24)
Calcium: 8 mg/dL — ABNORMAL LOW (ref 8.9–10.3)
GFR calc Af Amer: >60 mL/min (ref >60)
GFR calc non Af Amer: >60 mL/min (ref >60)
Glucose, Bld: 294 mg/dL — ABNORMAL HIGH (ref 65–99)
Potassium: 4.4 mmol/L (ref 3.5–5.1)
Sodium: 138 mmol/L (ref 135–145)

## 2015-02-07 LAB — CBC
HCT: 30.4 % — ABNORMAL LOW (ref 39.0–52.0)
Hemoglobin: 9.6 g/dL — ABNORMAL LOW (ref 13.0–17.0)
MCH: 25.9 pg — ABNORMAL LOW (ref 26.0–34.0)
MCHC: 31.6 g/dL (ref 30.0–36.0)
MCV: 82.2 fL (ref 78.0–100.0)
Platelets: 201 10*3/uL (ref 150–400)
RBC: 3.7 MIL/uL — AB (ref 4.22–5.81)
RDW: 20.1 % — AB (ref 11.5–15.5)
WBC: 7.3 10*3/uL (ref 4.0–10.5)

## 2015-02-07 LAB — PNEUMOCYSTIS JIROVECI SMEAR BY DFA: Pneumocystis jiroveci Ag: NEGATIVE

## 2015-02-07 LAB — LEGIONELLA ANTIGEN, URINE

## 2015-02-07 LAB — PROCALCITONIN: Procalcitonin: <0.1 ng/mL

## 2015-02-07 LAB — LYMPHOCYTE SUBSETS, FLOW CYTOMETRY (INPT)

## 2015-02-07 MED ORDER — ACETAMINOPHEN 325 MG PO TABS: 650.0000 mg | ORAL_TABLET | Freq: Three times a day (TID) | ORAL | Status: DC | PRN

## 2015-02-07 NOTE — Consult Note (Deleted)
   Name: Derek Anthony MRN: [id number] DOB: Apr 13, 1956    ADMISSION DATE:  02/04/2015 CONSULTATION DATE: 6/15  REFERRING MD :  [facility]  CHIEF COMPLAINT:  SOB  BRIEF PATIENT DESCRIPTION: 59 y/o M with recent diagnosis of dermatomyositis who was admitted 6/14 with progressive dyspnea (since March of 2016).  CT with new bilateral airspace disease.  PCCM consulted for evaluation.   SIGNIFICANT EVENTS  6/14  Admit with progressive dyspnea 6/16  FOB with bronchial washings   STUDIES:  6/14 CT Chest >> new bilateral airspace disease predominately in upper lobes, sub-pleural sparring     SUBJECTIVE: Pt denies new complaints.  Anxious to have results of bronch.    VITAL SIGNS: Temp:  [98.4 F (36.9 C)-99.6 F (37.6 C)] 98.5 F (36.9 C) (06/17 0553) Pulse Rate:  [63-108] 63 (06/17 0553) Resp:  [12-28] 20 (06/17 0553) BP: (140-202)/(80-112) 167/95 mmHg (06/17 0553) SpO2:  [84 %-100 %] 100 % (06/17 0553)  PHYSICAL EXAMINATION: General: WNWD AAM in no acute distress Neuro:  AAOx4, speech clear and articulate HEENT:  NO LAN/JVD, Oral mucosa clear, good dentition  Cardiovascular:  S1S2 rrr, no mumur Lungs:  Even/non-labored, lungs bilaterally with crackles in upper lobes, LLL Abdomen:  Soft +bs Musculoskeletal:  Intact Skin:  Warm and dry   Recent Labs Lab 02/04/15 1945 02/07/15 0428  NA 135 138  K 4.6 4.4  CL 102 106  CO2 22 22  BUN 21* 18  CREATININE 1.07 0.97  GLUCOSE 417* 294*    Recent Labs Lab 02/04/15 1945 02/07/15 0428  HGB 10.2* 9.6*  HCT 31.8* 30.4*  WBC 11.2* 7.3  PLT 258 201   No results found.  ASSESSMENT:    Lung infiltrate on CTm bilateral air space disease    Methotrexate toxicity, possibe afer 2 doses   Dyspnea - progressive, on exertion since March 2016   Cough x 3 mos with brown sputum   Dermatomyositis dx 3/16, on prednisone and post 2 cycles of methotrexate, last 6/13   Benign essential HTN   DM type 2, goal A1c below 7   Sickle cell  trait    Discussion: 59 yo aam, DX with Dermatomyositis 3/16 and has had 2 cycles of Methotrexate last on 6/13 and has been on prednisone for an extended time. 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 Chronic cough with brown sputum, no FCS. No lower ext edema noted, no chest pain or leg pain. His CT of chest shows new bilateral airspace disease predominately in upper lobes, sub-pleural sparring. DDx PCP from prednisone and methotrexate vs methotrexate injury vs NSIP from dermatomyositis   PLAN  O2 as needed to supports saturations > 90% Budesonide, claritin, flonase, singulair Prednisone 40 QAM, 20 QPM Empiric abx in immunosuppressed pt, doxy BID Trend PCT  Follow cultures If progressive disease, then will need open lung biopsy Follow up with Pulmonary at discharge  Await 6/16 FOB results:  BAL >> PCP >> AFB >>  Cell Ct >> Cytology >>  Culture >>    HTN, DM, Anemia, GERD, Dermatomyositis per primary MD  Patient updated at bedside.  Await return of labs to ensure proper discharge plan.  PCCM will follow.    Canary Brim, NP-C [place] [facility] Pgr: [phone] or [phone] 02/07/2015, 9:08 AM

## 2015-02-07 NOTE — Progress Notes (Signed)
[facility] PROGRESS NOTE  COREYON NICOTRA ZOX:[id number] DOB: [birth year] DOA: 02/04/2015 PCP: [name], MD  Assessment/Plan: 1-hypoxemic respiratory failure with worsening infiltrates in a patient with baseline dermatomyositis: No fever, no chest pain. Patient with shortness of breath mainly on x-ray showed and productive cough -CT chest demonstrating groundglass patchy consolidation -S/P bronchoscopy; will follow cx results. If neg for PCP plan is for high dose steroids X 3 days with subsequent use of prednisone  and use of cytoxan. Pulmonology on board will follow rec's -Sputum cultures non-representative of lower resp tract -Continue doxycycline for atypical infection -Continue supportive care, as needed oxygen supplementation and the use of Pulmicort -will add flutter valve  2-hypertension: stable and under fair control now -Continue current antihypertensive regimen  3-diabetes mellitus with neuropathy: On insulin therapy  -no further hypoglycemia -Will continue the use of 70/30 and Lantus  4-Neuropathy: Will continue the use of gabapentin  5-allergic rhinitis: Will continue the use of Claritin and Flonase  6-dermatomyositis: Continue prednisone. Methotrexate on hold   7- GERD/GI protection: Will continue Protonix   Code Status: Full code Family Communication: Wife at bedside Disposition Plan: To be determine; for now remains inpatient   Consultants:  Pulmonary service  Procedures:  See below for x-rays reports  Bronchoscopy done on 6/16  Antibiotics:  Doxycycline  HPI/Subjective: Afebrile, continue continue experiencing some shortness of breath/tachypnea at rest and with minimal exertion. Patient also with some productive cough.  Objective: Filed Vitals:   02/07/15 1350  BP: 146/77  Pulse: 92  Temp: 98.9 F (37.2 C)  Resp:     Intake/Output Summary (Last 24 hours) at 02/07/15 1630 Last data filed at 02/07/15 0826  Gross per 24  hour  Intake    240 ml  Output    225 ml  Net     15 ml   Filed Weights   02/04/15 1935 02/05/15 0352  Weight: 100.699 kg (222 lb) 103.329 kg (227 lb 12.8 oz)    Exam:   General:  Afebrile, still complaining of shortness of breath and productive cough. Feeling better overall. Patient tolerated bronchoscopy without complications.  Cardiovascular: S1 and S2, no rubs, no gallops  Respiratory: No wheezing, no crackles, positive rhonchi  Abdomen: Soft, nontender, nondistended, positive bowel sounds  Musculoskeletal: No edema, no cyanosis or clubbing.  Data Reviewed: Basic Metabolic Panel:  Recent Labs Lab 02/04/15 1945 02/07/15 0428  NA 135 138  K 4.6 4.4  CL 102 106  CO2 22 22  GLUCOSE 417* 294*  BUN 21* 18  CREATININE 1.07 0.97  CALCIUM 7.9* 8.0*   Liver Function Tests:  Recent Labs Lab 02/04/15 1945  AST 35  ALT 22  ALKPHOS 104  BILITOT 0.8  PROT 5.2*  ALBUMIN 2.6*   CBC:  Recent Labs Lab 02/04/15 1945 02/07/15 0428  WBC 11.2* 7.3  HGB 10.2* 9.6*  HCT 31.8* 30.4*  MCV 80.7 82.2  PLT 258 201   CBG:  Recent Labs Lab 02/06/15 1319 02/06/15 1732 02/06/15 2122 02/07/15 0740 02/07/15 1142  GLUCAP 80 135* 183* 196* 135*    Recent Results (from the past 240 hour(s))  Culture, blood (routine x 2)     Status: None (Preliminary result)   Collection Time: 02/04/15  9:00 PM  Result Value Ref Range Status   Specimen Description BLOOD RIGHT ARM  Final   Special Requests BOTTLES DRAWN AEROBIC AND ANAEROBIC 5CC EA  Final   Culture   Final  BLOOD CULTURE RECEIVED NO GROWTH TO DATE CULTURE WILL BE HELD FOR 5 DAYS BEFORE ISSUING A FINAL NEGATIVE REPORT Performed at [facility]    Report Status PENDING  Incomplete  Culture, blood (routine x 2)     Status: None (Preliminary result)   Collection Time: 02/04/15  9:15 PM  Result Value Ref Range Status   Specimen Description BLOOD LEFT ARM  Final   Special Requests BOTTLES DRAWN AEROBIC  AND ANAEROBIC 5CC EA  Final   Culture   Final           BLOOD CULTURE RECEIVED NO GROWTH TO DATE CULTURE WILL BE HELD FOR 5 DAYS BEFORE ISSUING A FINAL NEGATIVE REPORT Performed at [facility]    Report Status PENDING  Incomplete  Culture, sputum-assessment     Status: None   Collection Time: 02/06/15 11:27 AM  Result Value Ref Range Status   Specimen Description SPUTUM  Final   Special Requests NONE  Final   Sputum evaluation   Final    MICROSCOPIC FINDINGS SUGGEST THAT THIS SPECIMEN IS NOT REPRESENTATIVE OF LOWER RESPIRATORY SECRETIONS. PLEASE RECOLLECT. Results Called to: R L'ESPERANCE,RN AT 1205 02/06/15 BY L BENFIELD    Report Status 02/06/2015 FINAL  Final  Culture, respiratory (NON-Expectorated)     Status: None (Preliminary result)   Collection Time: 02/06/15 12:38 PM  Result Value Ref Range Status   Specimen Description BRONCHIAL ALVEOLAR LAVAGE  Final   Special Requests Immunocompromised  Final   Gram Stain   Final    RARE WBC PRESENT,BOTH PMN AND MONONUCLEAR NO SQUAMOUS EPITHELIAL CELLS SEEN NO ORGANISMS SEEN Performed at [facility]    Culture NO GROWTH Performed at [facility]   Final   Report Status PENDING  Incomplete  Fungus Culture with Smear     Status: None (Preliminary result)   Collection Time: 02/06/15 12:38 PM  Result Value Ref Range Status   Specimen Description BRONCHIAL ALVEOLAR LAVAGE  Final   Special Requests Immunocompromised  Final   Fungal Smear   Final    NO YEAST OR FUNGAL ELEMENTS SEEN Performed at [facility]    Culture   Final    CULTURE IN PROGRESS FOR FOUR WEEKS Performed at [facility]    Report Status PENDING  Incomplete  AFB culture with smear     Status: None (Preliminary result)   Collection Time: 02/06/15 12:38 PM  Result Value Ref Range Status   Specimen Description BRONCHIAL ALVEOLAR LAVAGE  Final   Special Requests Immunocompromised  Final   Acid Fast Smear   Final    NO ACID  FAST BACILLI SEEN Performed at [facility]    Culture   Final    CULTURE WILL BE EXAMINED FOR 6 WEEKS BEFORE ISSUING A FINAL REPORT Performed at [facility]    Report Status PENDING  Incomplete  Pneumocystis smear by DFA     Status: None   Collection Time: 02/06/15 12:38 PM  Result Value Ref Range Status   Specimen Source-[facility] BRONCHIAL ALVEOLAR LAVAGE  Final   Pneumocystis jiroveci Ag NEGATIVE  Final    Comment: Performed at [facility]     Studies: No results found.  Scheduled Meds: . allopurinol  300 mg Oral Daily  . aspirin EC  81 mg Oral Daily  . budesonide (PULMICORT) nebulizer solution  0.25 mg Nebulization BID  . doxycycline  100 mg Oral Q12H  . fluticasone  2  spray Each Nare Daily  . folic acid  1 mg Oral Daily  . gabapentin  1,200 mg Oral Daily  . gabapentin  600 mg Oral QHS  . heparin  5,000 Units Subcutaneous 3 times per day  . insulin aspart protamine- aspart  40 Units Subcutaneous BID WC  . insulin glargine  35 Units Subcutaneous BID  . irbesartan  300 mg Oral Daily  . loratadine  10 mg Oral Daily  . meloxicam  7.5 mg Oral Daily  . montelukast  10 mg Oral QHS  . pantoprazole  40 mg Oral Daily  . predniSONE  20 mg Oral Q supper  . predniSONE  40 mg Oral Q breakfast   Continuous Infusions: . sodium chloride 10 mL/hr at 02/06/15 9381    Active Problems:   Cough   Benign essential HTN   DM type 2, goal A1c below 7   Sickle cell trait   Dermatomyositis   Pulmonary infiltrates   Dyspnea on exertion since march 2016   Lung infiltrate on CTm bilateral air space disease   Methotrexate toxicity, possibe afe 2 doses   Time spent: 30 minutes   Vassie Loll  [facility] Pager [phone]. If 7PM-7AM, please contact night-coverage at [email or web address], password [facility] 02/07/2015, 4:30 PM  LOS: 2 days

## 2015-02-07 NOTE — Progress Notes (Addendum)
Name: Derek Anthony MRN: [id number] DOB: 1956-05-24    ADMISSION DATE:  02/04/2015 CONSULTATION DATE: 6/15  REFERRING MD :  [facility]  CHIEF COMPLAINT:  SOB  BRIEF PATIENT DESCRIPTION: 59 y/o M with recent diagnosis of dermatomyositis who was admitted 6/14 with progressive dyspnea (since March of 2016).  CT with new bilateral airspace disease.  PCCM consulted for evaluation.   SIGNIFICANT EVENTS  6/14  Admit with progressive dyspnea 6/16  FOB with bronchial washings   STUDIES:  6/14 CT Chest >> new bilateral airspace disease predominately in upper lobes, sub-pleural sparring     SUBJECTIVE: Pt denies new complaints.  Anxious to have results of bronch.    VITAL SIGNS: Temp:  [98.4 F (36.9 C)-99.6 F (37.6 C)] 98.5 F (36.9 C) (06/17 0553) Pulse Rate:  [63-108] 63 (06/17 0553) Resp:  [15-28] 20 (06/17 0553) BP: (140-200)/(80-112) 167/95 mmHg (06/17 0553) SpO2:  [84 %-100 %] 100 % (06/17 0553)  PHYSICAL EXAMINATION: General: WNWD AAM in no acute distress Neuro:  AAOx4, speech clear and articulate HEENT:  NO LAN/JVD, Oral mucosa clear, good dentition  Cardiovascular:  S1S2 rrr, no mumur Lungs:  Even/non-labored, lungs bilaterally with crackles in upper lobes, LLL Abdomen:  Soft +bs Musculoskeletal:  Intact Skin:  Warm and dry   Recent Labs Lab 02/04/15 1945 02/07/15 0428  NA 135 138  K 4.6 4.4  CL 102 106  CO2 22 22  BUN 21* 18  CREATININE 1.07 0.97  GLUCOSE 417* 294*    Recent Labs Lab 02/04/15 1945 02/07/15 0428  HGB 10.2* 9.6*  HCT 31.8* 30.4*  WBC 11.2* 7.3  PLT 258 201   No results found.  ASSESSMENT:    Lung infiltrate on CTm bilateral air space disease    Methotrexate toxicity, possibe afer 2 doses   Dyspnea - progressive, on exertion since March 2016   Cough x 3 mos with brown sputum   Dermatomyositis dx 3/16, on prednisone and post 2 cycles of methotrexate, last 6/13   Benign essential HTN   DM type 2, goal A1c below 7   Sickle cell  trait    Discussion: 59 yo aam, DX with Dermatomyositis 3/16 and has had 2 cycles of Methotrexate last on 6/13 and has been on prednisone for an extended time. 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 Chronic cough with brown sputum, no FCS. No lower ext edema noted, no chest pain or leg pain. His CT of chest shows new bilateral airspace disease predominately in upper lobes, sub-pleural sparring. DDx PCP from prednisone and methotrexate vs methotrexate injury vs NSIP from dermatomyositis   PLAN  O2 as needed to supports saturations > 90% Budesonide, claritin, flonase, singulair Prednisone 40 QAM, 20 QPM Empiric abx in immunosuppressed pt, doxy BID Trend PCT  Follow cultures If progressive disease, then will need open lung biopsy Follow up with Pulmonary at discharge  Await 6/16 FOB results:  BAL culture >> PCP >> AFB >>  Cell Ct >> 463 WBC, 65% mono, 27% pmn, 8% lymph, 0% eos Cytology >>  Flow Cytometry >> BAL Fungal Culture >>    HTN, DM, Anemia, GERD, Dermatomyositis per primary MD  Patient updated at bedside.  Await return of labs to ensure proper discharge plan.  PCCM will follow.    Canary Brim, NP-C [facility] Pgr: [phone] or [phone] 02/07/2015, 9:44 AM  Attending:  I have seen and examined the patient with nurse practitioner/resident and agree with the note above.   Derek Anthony feels well.  His lungs are surprisingly clear on exam  Still awaiting result of BAL, particularly PCP Cell count and diff from BAL shows a predominantly monocyte predominant fluid making acute infection and hypersensitivity pneumonitis seem slightly less likely (which is the typical injury pattern for MTX)  Impression/Plan: Worsening bilateral infiltrates in host with dermatomyositis > If the PCP stain is negative then we need to ramp up  immunosupression to treat for NSIP due connective tissue disease > CXR 2 view in AM > if PCP negative, would give pulse dose solumedrol (  IV q6h x3 days) followed by prednisone  > after pulse dose steroids would favor stopping methotrexate and changing to oral cytoxan /kg/day; I attempted to call his Rheumatologist Dr. Zenovia Jordan but she was not available > will need to be discharged ON PCP prophylaxis with bactrim > will need close follow up in Pulmonary clinic, will call to arrange  Heber Burgin, MD  PCCM Pager: [phone] Cell: [phone] After 3pm or if no response, call [phone]

## 2015-02-08 DIAGNOSIS — J189 Pneumonia, unspecified organism: Secondary | ICD-10-CM | POA: Insufficient documentation

## 2015-02-08 LAB — GLUCOSE, CAPILLARY
GLUCOSE-CAPILLARY: 92 mg/dL (ref 65–99)
Glucose-Capillary: 215 mg/dL — ABNORMAL HIGH (ref 65–99)
Glucose-Capillary: 89 mg/dL (ref 65–99)
Glucose-Capillary: 150 mg/dL — ABNORMAL HIGH (ref 65–99)

## 2015-02-08 MED ORDER — INSULIN ASPART 100 UNIT/ML ~~LOC~~ SOLN: 0.0000 [IU] | Freq: Three times a day (TID) | SUBCUTANEOUS | Status: DC

## 2015-02-08 MED ORDER — INSULIN ASPART 100 UNIT/ML ~~LOC~~ SOLN
0.0000 [IU] | Freq: Three times a day (TID) | SUBCUTANEOUS | Status: DC
Start: 2015-02-08 — End: 2015-02-13
  Administered 2015-02-08: 3 [IU] via SUBCUTANEOUS
  Administered 2015-02-09 (×2): 7 [IU] via SUBCUTANEOUS
  Administered 2015-02-09: 11 [IU] via SUBCUTANEOUS
  Administered 2015-02-09 – 2015-02-10 (×2): 7 [IU] via SUBCUTANEOUS
  Administered 2015-02-10: 11 [IU] via SUBCUTANEOUS
  Administered 2015-02-10 – 2015-02-11 (×4): 4 [IU] via SUBCUTANEOUS
  Administered 2015-02-11: 7 [IU] via SUBCUTANEOUS
  Administered 2015-02-11: 3 [IU] via SUBCUTANEOUS
  Administered 2015-02-12: 15 [IU] via SUBCUTANEOUS
  Administered 2015-02-12: 4 [IU] via SUBCUTANEOUS
  Administered 2015-02-12: 7 [IU] via SUBCUTANEOUS
  Administered 2015-02-12: 11 [IU] via SUBCUTANEOUS
  Administered 2015-02-13: 4 [IU] via SUBCUTANEOUS

## 2015-02-08 MED ORDER — SODIUM CHLORIDE 0.9 % IV SOLN
250.0000 mg | Freq: Four times a day (QID) | INTRAVENOUS | Status: AC
  Administered 2015-02-08 – 2015-02-11 (×12): 250 mg via INTRAVENOUS
  Filled 2015-02-08 (×15): qty 2

## 2015-02-08 NOTE — Progress Notes (Signed)
Name: Derek Anthony MRN: [id number] DOB: Aug 11, 1956    ADMISSION DATE:  02/04/2015 CONSULTATION DATE: 6/15  REFERRING MD :  [facility]  CHIEF COMPLAINT:  SOB  BRIEF PATIENT DESCRIPTION: 59 y/o M with recent diagnosis of dermatomyositis who was admitted 6/14 with progressive dyspnea (since March of 2016).  CT with new bilateral airspace disease.  PCCM consulted for evaluation.   SIGNIFICANT EVENTS  6/14  Admit with progressive dyspnea 6/16  FOB with bronchial washings>>neg for pcp   STUDIES:  6/14 CT Chest >> new bilateral airspace disease predominately in upper lobes, sub-pleural sparring  SUBJECTIVE:  No new issues  VITAL SIGNS: Temp:  [98.6 F (37 C)-98.9 F (37.2 C)] 98.6 F (37 C) (06/18 4098) Pulse Rate:  [85-93] 93 (06/18 0956) Resp:  [18-20] 20 (06/17 2212) BP: (146-171)/(77-89) 162/89 mmHg (06/18 0956) SpO2:  [87 %-97 %] 92 % (06/18 0801)  PHYSICAL EXAMINATION: General: WNWD AAM in no acute distress Neuro:  AAOx4, speech clear and articulate HEENT:  NO LAN/JVD, Oral mucosa clear, good dentition  Cardiovascular:  S1S2 rrr, no mumur Lungs:  Even/non-labored, lungs bilaterally with crackles in upper lobes, LLL Abdomen:  Soft +bs Musculoskeletal:  Intact Skin:  Warm and dry   Recent Labs Lab 02/04/15 1945 02/07/15 0428  NA 135 138  K 4.6 4.4  CL 102 106  CO2 22 22  BUN 21* 18  CREATININE 1.07 0.97  GLUCOSE 417* 294*    Recent Labs Lab 02/04/15 1945 02/07/15 0428  HGB 10.2* 9.6*  HCT 31.8* 30.4*  WBC 11.2* 7.3  PLT 258 201   No results found. BAL neg for PCP  ASSESSMENT:    Lung infiltrate on CTm bilateral air space disease , neg BAL for PCP   Methotrexate toxicity, possibe afer 2 doses   Dyspnea - progressive, on exertion since March 2016   Cough x 3 mos with brown sputum   Dermatomyositis dx 3/16, on prednisone and post 2 cycles of methotrexate, last 6/13   Benign essential HTN   DM type 2, goal A1c below 7   Sickle cell trait     Discussion: 59 yo aam, DX with Dermatomyositis 3/16 and has had 2 cycles of Methotrexate last on 6/13 and has been on prednisone for an extended time. 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 Chronic cough with brown sputum, no FCS. No lower ext edema noted, no chest pain or leg pain. His CT of chest shows new bilateral airspace disease predominately in upper lobes, sub-pleural sparring. 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  BAL NEG for PCP   PLAN  O2 as needed to supports saturations > 90% Cont Budesonide, claritin, flonase, singulair Start pulse dose solumedrol (  IV q6h x3 days) followed by prednisone  > after pulse dose steroids would favor stopping methotrexate and changing to oral cytoxan /kg/day;  We will call his Rheumatologist Dr. Zenovia Jordan on Monday. She was unavailable friday > will need to be discharged ON PCP prophylaxis with bactrim D/c doxycyclene Needs outpt pulm f/u with dr Kendrick Fries, will be arranged   6/16 FOB results:  BAL culture >>neg PCP >>neg AFB >> neg Cell Ct >> 463 WBC, 65% mono, 27% pmn, 8% lymph, 0% eos Cytology >> neg for CA  Flow Cytometry >>pending  BAL Fungal Culture >>pending    HTN, DM, Anemia, GERD, Dermatomyositis  per primary MD  Patient and spouse updated at bedside.    Dorcas Carrow Beeper  [phone]  Cell  [phone]  If no response or cell goes to voicemail, call beeper [phone]  10:10 AM 02/08/2015  02/08/2015, 10:05 AM

## 2015-02-08 NOTE — Progress Notes (Signed)
[facility] PROGRESS NOTE  Derek Anthony WUJ:[id number] DOB: 06-Mar-1956 DOA: 02/04/2015 PCP: [name], MD  Assessment/Plan: 1-hypoxemic respiratory failure with worsening infiltrates in a patient with baseline dermatomyositis: No fever, no chest pain. Patient with shortness of breath mainly on x-ray showed and productive cough -CT chest demonstrating groundglass patchy consolidation -S/P bronchoscopy; BAL neg for PCP. Follow pulmonologist rec's will start high dose steroids X 3 days with subsequent use of prednisone  daily and use of cytoxan. Pulmonology on board will follow tjeir rec's -Sputum cultures non-representative of lower resp tract -will discontinue doxycycline, no signs of infection on BAL -Continue supportive care, as needed oxygen supplementation and the use of Pulmicort -will continue flutter valve  2-hypertension: stable and under fair control now -Continue current antihypertensive regimen -will adjust as needed -expecting steroids to increased BP as well  3-diabetes mellitus with neuropathy: On insulin therapy at home -no further hypoglycemia, CBG's running high due to steroids therapy -Will continue the use of 70/30 and Lantus -on SSI as well  4-Neuropathy: Will continue the use of gabapentin  5-allergic rhinitis: Will continue the use of Claritin and Flonase  6-dermatomyositis: Methotrexate on hold; getting high dose steroids for NISP/pneumonitis -at discharge plan is for prednisone  daily and cytoxan  7- GERD/GI protection: Will continue Protonix   Code Status: Full code Family Communication: Wife at bedside Disposition Plan: To be determine; for now remains inpatient   Consultants:  Pulmonary service  Procedures:  See below for x-rays reports  Bronchoscopy done on 6/16 (BAL neg for PCP)  Antibiotics:  Doxycycline 6/14>>6/18  HPI/Subjective: Afebrile, continue continue experiencing some shortness of breath; but overall  feeling a lot better  Objective: Filed Vitals:   02/08/15 1336  BP: 162/85  Pulse: 88  Temp: 98.5 F (36.9 C)  Resp: 16    Intake/Output Summary (Last 24 hours) at 02/08/15 1533 Last data filed at 02/08/15 0721  Gross per 24 hour  Intake   1980 ml  Output    450 ml  Net   1530 ml   Filed Weights   02/04/15 1935 02/05/15 0352  Weight: 100.699 kg (222 lb) 103.329 kg (227 lb 12.8 oz)    Exam:   General:  Afebrile, still with some of shortness of breath; but feeling better overall. Patient now started on high dose steroids therapy.  Cardiovascular: S1 and S2, no rubs, no gallops  Respiratory: No wheezing, no crackles, positive rhonchi  Abdomen: Soft, nontender, nondistended, positive bowel sounds  Musculoskeletal: No edema, no cyanosis or clubbing.  Data Reviewed: Basic Metabolic Panel:  Recent Labs Lab 02/04/15 1945 02/07/15 0428  NA 135 138  K 4.6 4.4  CL 102 106  CO2 22 22  GLUCOSE 417* 294*  BUN 21* 18  CREATININE 1.07 0.97  CALCIUM 7.9* 8.0*   Liver Function Tests:  Recent Labs Lab 02/04/15 1945  AST 35  ALT 22  ALKPHOS 104  BILITOT 0.8  PROT 5.2*  ALBUMIN 2.6*   CBC:  Recent Labs Lab 02/04/15 1945 02/07/15 0428  WBC 11.2* 7.3  HGB 10.2* 9.6*  HCT 31.8* 30.4*  MCV 80.7 82.2  PLT 258 201   CBG:  Recent Labs Lab 02/07/15 1142 02/07/15 1629 02/07/15 2216 02/08/15 0823 02/08/15 1226  GLUCAP 135* 220* 271* 215* 92    Recent Results (from the past 240 hour(s))  Culture, blood (routine x 2)     Status: None (Preliminary result)   Collection Time: 02/04/15  9:00 PM  Result Value  Ref Range Status   Specimen Description BLOOD RIGHT ARM  Final   Special Requests BOTTLES DRAWN AEROBIC AND ANAEROBIC 5CC EA  Final   Culture   Final           BLOOD CULTURE RECEIVED NO GROWTH TO DATE CULTURE WILL BE HELD FOR 5 DAYS BEFORE ISSUING A FINAL NEGATIVE REPORT Performed at [facility]    Report Status PENDING  Incomplete  Culture,  blood (routine x 2)     Status: None (Preliminary result)   Collection Time: 02/04/15  9:15 PM  Result Value Ref Range Status   Specimen Description BLOOD LEFT ARM  Final   Special Requests BOTTLES DRAWN AEROBIC AND ANAEROBIC 5CC EA  Final   Culture   Final           BLOOD CULTURE RECEIVED NO GROWTH TO DATE CULTURE WILL BE HELD FOR 5 DAYS BEFORE ISSUING A FINAL NEGATIVE REPORT Performed at [facility]    Report Status PENDING  Incomplete  Culture, sputum-assessment     Status: None   Collection Time: 02/06/15 11:27 AM  Result Value Ref Range Status   Specimen Description SPUTUM  Final   Special Requests NONE  Final   Sputum evaluation   Final    MICROSCOPIC FINDINGS SUGGEST THAT THIS SPECIMEN IS NOT REPRESENTATIVE OF LOWER RESPIRATORY SECRETIONS. PLEASE RECOLLECT. Results Called to: R L'ESPERANCE,RN AT 1205 02/06/15 BY L BENFIELD    Report Status 02/06/2015 FINAL  Final  Culture, respiratory (NON-Expectorated)     Status: None (Preliminary result)   Collection Time: 02/06/15 12:38 PM  Result Value Ref Range Status   Specimen Description BRONCHIAL ALVEOLAR LAVAGE  Final   Special Requests Immunocompromised  Final   Gram Stain   Final    RARE WBC PRESENT,BOTH PMN AND MONONUCLEAR NO SQUAMOUS EPITHELIAL CELLS SEEN NO ORGANISMS SEEN Performed at [facility]    Culture   Final    Culture reincubated for better growth Performed at [facility]    Report Status PENDING  Incomplete  Fungus Culture with Smear     Status: None (Preliminary result)   Collection Time: 02/06/15 12:38 PM  Result Value Ref Range Status   Specimen Description BRONCHIAL ALVEOLAR LAVAGE  Final   Special Requests Immunocompromised  Final   Fungal Smear   Final    NO YEAST OR FUNGAL ELEMENTS SEEN Performed at [facility]    Culture   Final    CULTURE IN PROGRESS FOR FOUR WEEKS Performed at [facility]    Report Status PENDING  Incomplete  AFB culture with smear      Status: None (Preliminary result)   Collection Time: 02/06/15 12:38 PM  Result Value Ref Range Status   Specimen Description BRONCHIAL ALVEOLAR LAVAGE  Final   Special Requests Immunocompromised  Final   Acid Fast Smear   Final    NO ACID FAST BACILLI SEEN Performed at [facility]    Culture   Final    CULTURE WILL BE EXAMINED FOR 6 WEEKS BEFORE ISSUING A FINAL REPORT Performed at [facility]    Report Status PENDING  Incomplete  Pneumocystis smear by DFA     Status: None   Collection Time: 02/06/15 12:38 PM  Result Value Ref Range Status   Specimen Source-[facility] BRONCHIAL ALVEOLAR LAVAGE  Final   Pneumocystis jiroveci Ag NEGATIVE  Final    Comment: Performed at [facility]  Studies: No results found.  Scheduled Meds: . allopurinol  300 mg Oral Daily  . aspirin EC  81 mg Oral Daily  . budesonide (PULMICORT) nebulizer solution  0.25 mg Nebulization BID  . fluticasone  2 spray Each Nare Daily  . folic acid  1 mg Oral Daily  . gabapentin  1,200 mg Oral Daily  . gabapentin  600 mg Oral QHS  . heparin  5,000 Units Subcutaneous 3 times per day  . insulin aspart  0-20 Units Subcutaneous TID AC & HS  . insulin aspart protamine- aspart  40 Units Subcutaneous BID WC  . insulin glargine  35 Units Subcutaneous BID  . irbesartan  300 mg Oral Daily  . loratadine  10 mg Oral Daily  . meloxicam  7.5 mg Oral Daily  . methylPREDNISolone (SOLU-MEDROL) injection  250 mg Intravenous Q6H  . montelukast  10 mg Oral QHS  . pantoprazole  40 mg Oral Daily   Continuous Infusions: . sodium chloride 10 mL/hr at 02/06/15 4098    Active Problems:   Cough   Benign essential HTN   DM type 2, goal A1c below 7   Sickle cell trait   Dermatomyositis   Pulmonary infiltrates   Dyspnea on exertion since march 2016   Lung infiltrate on CTm bilateral air space disease   Methotrexate toxicity, possibe afe 2 doses   Time spent: 30 minutes   Vassie Loll  [facility] Pager [phone]. If 7PM-7AM, please contact night-coverage at [email or web address], password [facility] 02/08/2015, 3:33 PM  LOS: 3 days

## 2015-02-09 LAB — CULTURE, RESPIRATORY W GRAM STAIN

## 2015-02-09 LAB — CULTURE, RESPIRATORY

## 2015-02-09 LAB — GLUCOSE, CAPILLARY
Glucose-Capillary: 279 mg/dL — ABNORMAL HIGH (ref 65–99)
Glucose-Capillary: 241 mg/dL — ABNORMAL HIGH (ref 65–99)
Glucose-Capillary: 213 mg/dL — ABNORMAL HIGH (ref 65–99)
Glucose-Capillary: 202 mg/dL — ABNORMAL HIGH (ref 65–99)

## 2015-02-09 LAB — PROCALCITONIN: Procalcitonin: <0.1 ng/mL

## 2015-02-09 MED ORDER — LIDOCAINE 4 % EX CREA
TOPICAL_CREAM | Freq: Three times a day (TID) | CUTANEOUS | Status: DC | PRN
  Administered 2015-02-09 – 2015-02-11 (×2): 1 via TOPICAL
  Filled 2015-02-09 (×2): qty 5

## 2015-02-09 MED ORDER — INSULIN GLARGINE 100 UNIT/ML ~~LOC~~ SOLN
40.0000 [IU] | Freq: Two times a day (BID) | SUBCUTANEOUS | Status: DC
  Administered 2015-02-09 – 2015-02-12 (×6): 40 [IU] via SUBCUTANEOUS
  Filled 2015-02-09 (×7): qty 0.4

## 2015-02-09 NOTE — Progress Notes (Signed)
PT was educated on how to use the incentive spirometer today with a goal of maintaining at least 1250 every time. Patient met his goal by verbalizing understanding and return demonstrating appropriate use by using the incentive spirometer four times in the hour and maintaining his goal by exceeding 1250.

## 2015-02-09 NOTE — Progress Notes (Signed)
Name: Derek Anthony MRN: [id number] DOB: Oct 27, 1955    ADMISSION DATE:  02/04/2015 CONSULTATION DATE: 6/15  REFERRING MD :  [facility]  CHIEF COMPLAINT:  SOB  BRIEF PATIENT DESCRIPTION: 59 y/o M with recent diagnosis of dermatomyositis who was admitted 6/14 with progressive dyspnea (since March of 2016).  CT with new bilateral airspace disease.  PCCM consulted for evaluation.   SIGNIFICANT EVENTS  6/14  Admit with progressive dyspnea 6/16  FOB with bronchial washings>>neg for pcp   STUDIES:  6/14 CT Chest >> new bilateral airspace disease predominately in upper lobes, sub-pleural sparring  SUBJECTIVE:  Prod cough brown material.  No chest pain. On high dose IV medrol  VITAL SIGNS: Temp:  [98.1 F (36.7 C)-98.6 F (37 C)] 98.1 F (36.7 C) (06/19 0557) Pulse Rate:  [88-95] 92 (06/19 0557) Resp:  [16-20] 20 (06/19 0557) BP: (162-182)/(85-97) 166/96 mmHg (06/19 0557) SpO2:  [92 %-93 %] 93 % (06/19 0557)  PHYSICAL EXAMINATION: General: WNWD AAM in no acute distress Neuro:  AAOx4, speech clear and articulate HEENT:  NO LAN/JVD, Oral mucosa clear, good dentition  Cardiovascular:  S1S2 rrr, no mumur Lungs:  Even/non-labored, lungs bilaterally with crackles in upper lobes, LLL but improved Abdomen:  Soft +bs Musculoskeletal:  Intact Skin:  Warm and dry   Recent Labs Lab 02/04/15 1945 02/07/15 0428  NA 135 138  K 4.6 4.4  CL 102 106  CO2 22 22  BUN 21* 18  CREATININE 1.07 0.97  GLUCOSE 417* 294*    Recent Labs Lab 02/04/15 1945 02/07/15 0428  HGB 10.2* 9.6*  HCT 31.8* 30.4*  WBC 11.2* 7.3  PLT 258 201   No results found. BAL neg for PCP  ASSESSMENT:    Lung infiltrate on CTm bilateral air space disease , neg BAL for PCP   Doubt Methotrexate toxicity, with cellular features of BAL   Dyspnea - progressive, on exertion since March 2016   Cough x 3 mos with brown sputum   Dermatomyositis dx 3/16, on prednisone and post 2 cycles of methotrexate, last 6/13  Benign essential HTN   DM type 2, goal A1c below 7   Sickle cell trait    Discussion: 59 yo aam, DX with Dermatomyositis 3/16 and has had 2 cycles of Methotrexate last on 6/13 and has been on prednisone for an extended time. 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 Chronic cough with brown sputum, no FCS. No lower ext edema noted, no chest pain or leg pain. His CT of chest shows new bilateral airspace disease predominately in upper lobes, sub-pleural sparring. 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   Note BAL NEG for PCP Suspect infiltrates are inflammatory d/t dermatomyositis with pneumonitis.   PLAN  O2 as needed to supports saturations > 90% Cont Budesonide, claritin, flonase, singulair Cont  pulse dose solumedrol (  IV q6h x3 days) followed by prednisone  > after pulse dose steroids would favor stopping methotrexate and changing to oral cytoxan /kg/day;  We will call his Rheumatologist Dr. Zenovia Jordan on Monday. She was unavailable friday > will need to be discharged ON PCP prophylaxis with bactrim D/c doxycyclene Needs outpt pulm f/u with dr Kendrick Fries, will be arranged   6/16 FOB results:  BAL culture >>neg PCP >>neg AFB >> neg Cell Ct >> 463 WBC, 65% mono, 27% pmn, 8% lymph, 0%  eos Cytology >> neg for CA  Flow Cytometry >>pending  BAL Fungal Culture >>pending    HTN, DM, Anemia, GERD, Dermatomyositis per primary MD  Patient and spouse updated at bedside.    Dorcas Carrow Beeper  [phone]  Cell  [phone]  If no response or cell goes to voicemail, call beeper [phone]  10:03 AM 02/09/2015  02/09/2015, 10:03 AM

## 2015-02-09 NOTE — Progress Notes (Signed)
[facility] PROGRESS NOTE  Derek Anthony ZOX:[id number] DOB: 07/19/1956 DOA: 02/04/2015 PCP: [name], MD  Assessment/Plan: 1-hypoxemic respiratory failure with worsening infiltrates in a patient with baseline dermatomyositis: No fever, no chest pain. Patient with shortness of breath mainly on x-ray showed and productive cough -CT chest demonstrating groundglass patchy consolidation -S/P bronchoscopy; BAL neg for PCP. Follow pulmonologist rec's will start high dose steroids X 3 days with subsequent use of prednisone  daily and use of cytoxan. Will also need treatment with bactrim for PCP prophylaxis at discharge. Pulmonology on board will follow tjeir rec's -Sputum cultures non-representative of lower resp tract -Doxycycline has been disocntinued, no signs of infection on BAL -Continue supportive care, as needed oxygen supplementation and the use of Pulmicort -will continue flutter valve  2-hypertension: stable and under fair control now -Continue current antihypertensive regimen -will adjust as needed -expecting steroids to increased BP as well  3-diabetes mellitus with neuropathy: On insulin therapy at home -no further hypoglycemia, CBG's running high due to steroids therapy -Will continue the use of 70/30 and Lantus, last one will be increase to 40 units for better control -on SSI as well  4-Neuropathy: Will continue the use of gabapentin  5-Allergic rhinitis: Will continue the use of Claritin and Flonase  6-dermatomyositis: Methotrexate on hold; getting high dose steroids for NISP/pneumonitis -at discharge plan is for prednisone  daily and cytoxan -will contact [name] on Monday for updates and rec's.  7- GERD/GI protection: Will continue Protonix   Code Status: Full code Family Communication: Wife at bedside Disposition Plan: To be determine; for now remains inpatient   Consultants:  Pulmonary service  Procedures:  See below for x-rays  reports  Bronchoscopy done on 6/16 (BAL neg for PCP)  Antibiotics:  Doxycycline 6/14>>6/18  HPI/Subjective: Afebrile, continue continue experiencing some shortness of breath with exertion and deep breaths); but overall is feeling better.  Objective: Filed Vitals:   02/09/15 1030  BP: 167/95  Pulse: 103  Temp:   Resp:     Intake/Output Summary (Last 24 hours) at 02/09/15 1235 Last data filed at 02/09/15 0430  Gross per 24 hour  Intake    344 ml  Output   1550 ml  Net  -1206 ml   Filed Weights   02/04/15 1935 02/05/15 0352  Weight: 100.699 kg (222 lb) 103.329 kg (227 lb 12.8 oz)    Exam:   General:  Afebrile, feeling better overall, but still with some SOB (on exertion and/or with deep inspiration). Tolerating treatment with high dose steroids.  Cardiovascular: S1 and S2, no rubs, no gallops  Respiratory: No wheezing, no crackles, positive rhonchi  Abdomen: Soft, nontender, nondistended, positive bowel sounds  Musculoskeletal: No edema, no cyanosis or clubbing.  Data Reviewed: Basic Metabolic Panel:  Recent Labs Lab 02/04/15 1945 02/07/15 0428  NA 135 138  K 4.6 4.4  CL 102 106  CO2 22 22  GLUCOSE 417* 294*  BUN 21* 18  CREATININE 1.07 0.97  CALCIUM 7.9* 8.0*   Liver Function Tests:  Recent Labs Lab 02/04/15 1945  AST 35  ALT 22  ALKPHOS 104  BILITOT 0.8  PROT 5.2*  ALBUMIN 2.6*   CBC:  Recent Labs Lab 02/04/15 1945 02/07/15 0428  WBC 11.2* 7.3  HGB 10.2* 9.6*  HCT 31.8* 30.4*  MCV 80.7 82.2  PLT 258 201   CBG:  Recent Labs Lab 02/08/15 1226 02/08/15 1707 02/08/15 2307 02/09/15 0852 02/09/15 1153  GLUCAP 92 89 150* 279* 241*  Recent Results (from the past 240 hour(s))  Culture, blood (routine x 2)     Status: None (Preliminary result)   Collection Time: 02/04/15  9:00 PM  Result Value Ref Range Status   Specimen Description BLOOD RIGHT ARM  Final   Special Requests BOTTLES DRAWN AEROBIC AND ANAEROBIC 5CC EA  Final    Culture   Final           BLOOD CULTURE RECEIVED NO GROWTH TO DATE CULTURE WILL BE HELD FOR 5 DAYS BEFORE ISSUING A FINAL NEGATIVE REPORT Performed at [facility]    Report Status PENDING  Incomplete  Culture, blood (routine x 2)     Status: None (Preliminary result)   Collection Time: 02/04/15  9:15 PM  Result Value Ref Range Status   Specimen Description BLOOD LEFT ARM  Final   Special Requests BOTTLES DRAWN AEROBIC AND ANAEROBIC 5CC EA  Final   Culture   Final           BLOOD CULTURE RECEIVED NO GROWTH TO DATE CULTURE WILL BE HELD FOR 5 DAYS BEFORE ISSUING A FINAL NEGATIVE REPORT Performed at [facility]    Report Status PENDING  Incomplete  Culture, sputum-assessment     Status: None   Collection Time: 02/06/15 11:27 AM  Result Value Ref Range Status   Specimen Description SPUTUM  Final   Special Requests NONE  Final   Sputum evaluation   Final    MICROSCOPIC FINDINGS SUGGEST THAT THIS SPECIMEN IS NOT REPRESENTATIVE OF LOWER RESPIRATORY SECRETIONS. PLEASE RECOLLECT. Results Called to: R L'ESPERANCE,RN AT 1205 02/06/15 BY L BENFIELD    Report Status 02/06/2015 FINAL  Final  Culture, respiratory (NON-Expectorated)     Status: None   Collection Time: 02/06/15 12:38 PM  Result Value Ref Range Status   Specimen Description BRONCHIAL ALVEOLAR LAVAGE  Final   Special Requests Immunocompromised  Final   Gram Stain   Final    RARE WBC PRESENT,BOTH PMN AND MONONUCLEAR NO SQUAMOUS EPITHELIAL CELLS SEEN NO ORGANISMS SEEN Performed at [facility]    Culture   Final    Non-Pathogenic Oropharyngeal-type Flora Isolated. Performed at [facility]    Report Status 02/09/2015 FINAL  Final  Fungus Culture with Smear     Status: None (Preliminary result)   Collection Time: 02/06/15 12:38 PM  Result Value Ref Range Status   Specimen Description BRONCHIAL ALVEOLAR LAVAGE  Final   Special Requests Immunocompromised  Final   Fungal Smear   Final    NO  YEAST OR FUNGAL ELEMENTS SEEN Performed at [facility]    Culture   Final    CULTURE IN PROGRESS FOR FOUR WEEKS Performed at [facility]    Report Status PENDING  Incomplete  AFB culture with smear     Status: None (Preliminary result)   Collection Time: 02/06/15 12:38 PM  Result Value Ref Range Status   Specimen Description BRONCHIAL ALVEOLAR LAVAGE  Final   Special Requests Immunocompromised  Final   Acid Fast Smear   Final    NO ACID FAST BACILLI SEEN Performed at [facility]    Culture   Final    CULTURE WILL BE EXAMINED FOR 6 WEEKS BEFORE ISSUING A FINAL REPORT Performed at [facility]    Report Status PENDING  Incomplete  Pneumocystis smear by DFA     Status: None   Collection Time: 02/06/15 12:38 PM  Result Value Ref Range Status  Specimen Source-[facility] BRONCHIAL ALVEOLAR LAVAGE  Final   Pneumocystis jiroveci Ag NEGATIVE  Final    Comment: Performed at [facility]     Studies: No results found.  Scheduled Meds: . allopurinol  300 mg Oral Daily  . aspirin EC  81 mg Oral Daily  . budesonide (PULMICORT) nebulizer solution  0.25 mg Nebulization BID  . fluticasone  2 spray Each Nare Daily  . folic acid  1 mg Oral Daily  . gabapentin  1,200 mg Oral Daily  . gabapentin  600 mg Oral QHS  . heparin  5,000 Units Subcutaneous 3 times per day  . insulin aspart  0-20 Units Subcutaneous TID AC & HS  . insulin aspart protamine- aspart  40 Units Subcutaneous BID WC  . insulin glargine  40 Units Subcutaneous BID  . irbesartan  300 mg Oral Daily  . loratadine  10 mg Oral Daily  . meloxicam  7.5 mg Oral Daily  . methylPREDNISolone (SOLU-MEDROL) injection  250 mg Intravenous Q6H  . montelukast  10 mg Oral QHS  . pantoprazole  40 mg Oral Daily   Continuous Infusions: . sodium chloride 10 mL/hr at 02/06/15 3220    Active Problems:   Cough   Benign essential HTN   DM type 2, goal A1c below 7   Sickle cell trait    Dermatomyositis   Pulmonary infiltrates   Dyspnea on exertion since march 2016   Lung infiltrate on CTm bilateral air space disease   Methotrexate toxicity, possibe afe 2 doses   Time spent: 30 minutes   Vassie Loll  [facility] Pager [phone]. If 7PM-7AM, please contact night-coverage at [email or web address], password [facility] 02/09/2015, 12:35 PM  LOS: 4 days

## 2015-02-10 LAB — GLUCOSE, CAPILLARY
GLUCOSE-CAPILLARY: 162 mg/dL — AB (ref 65–99)
Glucose-Capillary: 190 mg/dL — ABNORMAL HIGH (ref 65–99)
Glucose-Capillary: 203 mg/dL — ABNORMAL HIGH (ref 65–99)
Glucose-Capillary: 262 mg/dL — ABNORMAL HIGH (ref 65–99)

## 2015-02-10 NOTE — Progress Notes (Signed)
Inpatient Diabetes Program Recommendations  AACE/ADA: New Consensus Statement on Inpatient Glycemic Control (2013)  Target Ranges:  Prepandial:   less than 140 mg/dL      Peak postprandial:   less than 180 mg/dL (1-2 hours)      Critically ill patients:  140 - 180 mg/dL    Note: Patient receiving Novolog resistant scale tid before meals and at HS.    Recommendation: Change Novolog resistant scale to tid with meals Order Novolog HS correction scale at HS.  Thank you.  Lanayah Gartley S. Elsie Lincoln, RN, CNS, CDE Inpatient Diabetes Program, team pager [phone] (8am to 5pm)

## 2015-02-10 NOTE — Progress Notes (Signed)
[facility] PROGRESS NOTE  Derek Anthony EAV:[id number] DOB: 1955/10/24 DOA: 02/04/2015 PCP: [name], MD  Assessment/Plan: 1-hypoxemic respiratory failure with worsening infiltrates in a patient with baseline dermatomyositis: No fever, no chest pain. Patient with shortness of breath mainly on x-ray showed and productive cough -CT chest demonstrating groundglass patchy consolidation -S/P bronchoscopy; BAL neg for PCP. Follow pulmonologist rec's will start high dose steroids X 3 days with subsequent use of prednisone  daily and use of cytoxan. Will also need treatment with bactrim for PCP prophylaxis at discharge. Pulmonology on board will follow tjeir rec's -Sputum cultures non-representative of lower resp tract -Doxycycline has been disocntinued, no signs of infection on BAL -Continue supportive care, as needed oxygen supplementation and the use of Pulmicort -will continue flutter valve -PRN oxygen supplementation   2-hypertension: stable and under fair control now -Continue current antihypertensive regimen -will adjust as needed -expecting steroids to increased BP as well  3-diabetes mellitus with neuropathy: On insulin therapy at home -no further hypoglycemia, CBG's running high due to steroids therapy -Will continue the use of 70/30 and Lantus, last one will be increase to 40 units for better control -on SSI as well -CBG's in 160-190 now, will monitor -expecting improvement with lower dose of steroids   4-Neuropathy: Will continue the use of gabapentin  5-Allergic rhinitis: Will continue the use of Claritin and Flonase  6-dermatomyositis: Methotrexate on hold; getting high dose steroids for NISP/pneumonitis -at discharge plan is for prednisone  daily and cytoxan -per Pulmonary service they will contact [name] at discharge with updates, and for any other rec's.  7- GERD/GI protection: Will continue Protonix   Code Status: Full code Family  Communication: Wife at bedside Disposition Plan: To be determine; for now remains inpatient   Consultants:  Pulmonary service  Procedures:  See below for x-rays reports  Bronchoscopy done on 6/16 (BAL neg for PCP)  Antibiotics:  Doxycycline 6/14>>6/18  HPI/Subjective: Afebrile, mild hypoxic episode and SOB with activity; responded well to use of 2-3L oxygen supplementation. No CP, no N/V  Objective: Filed Vitals:   02/10/15 1436  BP: 151/78  Pulse: 101  Temp: 98.4 F (36.9 C)  Resp: 19    Intake/Output Summary (Last 24 hours) at 02/10/15 1615 Last data filed at 02/10/15 1554  Gross per 24 hour  Intake    836 ml  Output   1950 ml  Net  -1114 ml   Filed Weights   02/04/15 1935 02/05/15 0352  Weight: 100.699 kg (222 lb) 103.329 kg (227 lb 12.8 oz)    Exam:   General:  Afebrile, feeling better overall, but with some hypoxic episode and SOB with activity today. No fever, no nausea, no vomiting   Cardiovascular: S1 and S2, no rubs, no gallops  Respiratory: No wheezing, no crackles, positive rhonchi  Abdomen: Soft, nontender, nondistended, positive bowel sounds  Musculoskeletal: No edema, no cyanosis or clubbing.  Data Reviewed: Basic Metabolic Panel:  Recent Labs Lab 02/04/15 1945 02/07/15 0428  NA 135 138  K 4.6 4.4  CL 102 106  CO2 22 22  GLUCOSE 417* 294*  BUN 21* 18  CREATININE 1.07 0.97  CALCIUM 7.9* 8.0*   Liver Function Tests:  Recent Labs Lab 02/04/15 1945  AST 35  ALT 22  ALKPHOS 104  BILITOT 0.8  PROT 5.2*  ALBUMIN 2.6*   CBC:  Recent Labs Lab 02/04/15 1945 02/07/15 0428  WBC 11.2* 7.3  HGB 10.2* 9.6*  HCT 31.8* 30.4*  MCV 80.7  82.2  PLT 258 201   CBG:  Recent Labs Lab 02/09/15 1153 02/09/15 1742 02/09/15 2139 02/10/15 0803 02/10/15 1147  GLUCAP 241* 213* 202* 162* 190*    Recent Results (from the past 240 hour(s))  Culture, blood (routine x 2)     Status: None (Preliminary result)   Collection Time:  02/04/15  9:00 PM  Result Value Ref Range Status   Specimen Description BLOOD RIGHT ARM  Final   Special Requests BOTTLES DRAWN AEROBIC AND ANAEROBIC 5CC EA  Final   Culture   Final           BLOOD CULTURE RECEIVED NO GROWTH TO DATE CULTURE WILL BE HELD FOR 5 DAYS BEFORE ISSUING A FINAL NEGATIVE REPORT Performed at [facility]    Report Status PENDING  Incomplete  Culture, blood (routine x 2)     Status: None (Preliminary result)   Collection Time: 02/04/15  9:15 PM  Result Value Ref Range Status   Specimen Description BLOOD LEFT ARM  Final   Special Requests BOTTLES DRAWN AEROBIC AND ANAEROBIC 5CC EA  Final   Culture   Final           BLOOD CULTURE RECEIVED NO GROWTH TO DATE CULTURE WILL BE HELD FOR 5 DAYS BEFORE ISSUING A FINAL NEGATIVE REPORT Performed at [facility]    Report Status PENDING  Incomplete  Culture, sputum-assessment     Status: None   Collection Time: 02/06/15 11:27 AM  Result Value Ref Range Status   Specimen Description SPUTUM  Final   Special Requests NONE  Final   Sputum evaluation   Final    MICROSCOPIC FINDINGS SUGGEST THAT THIS SPECIMEN IS NOT REPRESENTATIVE OF LOWER RESPIRATORY SECRETIONS. PLEASE RECOLLECT. Results Called to: R L'ESPERANCE,RN AT 1205 02/06/15 BY L BENFIELD    Report Status 02/06/2015 FINAL  Final  Culture, respiratory (NON-Expectorated)     Status: None   Collection Time: 02/06/15 12:38 PM  Result Value Ref Range Status   Specimen Description BRONCHIAL ALVEOLAR LAVAGE  Final   Special Requests Immunocompromised  Final   Gram Stain   Final    RARE WBC PRESENT,BOTH PMN AND MONONUCLEAR NO SQUAMOUS EPITHELIAL CELLS SEEN NO ORGANISMS SEEN Performed at [facility]    Culture   Final    Non-Pathogenic Oropharyngeal-type Flora Isolated. Performed at [facility]    Report Status 02/09/2015 FINAL  Final  Fungus Culture with Smear     Status: None (Preliminary result)   Collection Time: 02/06/15 12:38 PM   Result Value Ref Range Status   Specimen Description BRONCHIAL ALVEOLAR LAVAGE  Final   Special Requests Immunocompromised  Final   Fungal Smear   Final    NO YEAST OR FUNGAL ELEMENTS SEEN Performed at [facility]    Culture   Final    CULTURE IN PROGRESS FOR FOUR WEEKS Performed at [facility]    Report Status PENDING  Incomplete  AFB culture with smear     Status: None (Preliminary result)   Collection Time: 02/06/15 12:38 PM  Result Value Ref Range Status   Specimen Description BRONCHIAL ALVEOLAR LAVAGE  Final   Special Requests Immunocompromised  Final   Acid Fast Smear   Final    NO ACID FAST BACILLI SEEN Performed at [facility]    Culture   Final    CULTURE WILL BE EXAMINED FOR 6 WEEKS BEFORE ISSUING A FINAL REPORT Performed at [facility]  Report Status PENDING  Incomplete  Pneumocystis smear by DFA     Status: None   Collection Time: 02/06/15 12:38 PM  Result Value Ref Range Status   Specimen Source-[facility] BRONCHIAL ALVEOLAR LAVAGE  Final   Pneumocystis jiroveci Ag NEGATIVE  Final    Comment: Performed at [facility]     Studies: No results found.  Scheduled Meds: . allopurinol  300 mg Oral Daily  . aspirin EC  81 mg Oral Daily  . budesonide (PULMICORT) nebulizer solution  0.25 mg Nebulization BID  . fluticasone  2 spray Each Nare Daily  . folic acid  1 mg Oral Daily  . gabapentin  1,200 mg Oral Daily  . gabapentin  600 mg Oral QHS  . heparin  5,000 Units Subcutaneous 3 times per day  . insulin aspart  0-20 Units Subcutaneous TID AC & HS  . insulin aspart protamine- aspart  40 Units Subcutaneous BID WC  . insulin glargine  40 Units Subcutaneous BID  . irbesartan  300 mg Oral Daily  . loratadine  10 mg Oral Daily  . meloxicam  7.5 mg Oral Daily  . methylPREDNISolone (SOLU-MEDROL) injection  250 mg Intravenous Q6H  . montelukast  10 mg Oral QHS  . pantoprazole  40 mg Oral Daily   Continuous  Infusions: . sodium chloride 10 mL/hr at 02/06/15 3536    Active Problems:   Cough   Benign essential HTN   DM type 2, goal A1c below 7   Sickle cell trait   Dermatomyositis   Pulmonary infiltrates   Dyspnea on exertion since march 2016   Lung infiltrate on CTm bilateral air space disease   Methotrexate toxicity, possibe afe 2 doses   Time spent: 30 minutes   Vassie Loll  [facility] Pager [phone]. If 7PM-7AM, please contact night-coverage at [email or web address], password [facility] 02/10/2015, 4:15 PM  LOS: 5 days

## 2015-02-10 NOTE — Progress Notes (Signed)
Utilization Review completed. Trinidee Schrag RN BSN CM 

## 2015-02-10 NOTE — Progress Notes (Signed)
Patient was having some shortness of breath. Checked O2 sat and was 77% on room air. Placed patient on 3L of O2 and had patient do some deep breathing. O2 sat rose to 93% on 3L. MD notified. Will continue to monitor.

## 2015-02-10 NOTE — Progress Notes (Signed)
   Name: Derek Anthony MRN: [id number] DOB: [birth year]    ADMISSION DATE:  02/04/2015 CONSULTATION DATE: 6/15  REFERRING MD :  [facility]  CHIEF COMPLAINT:  SOB  BRIEF PATIENT DESCRIPTION: 59 y/o M with recent diagnosis of dermatomyositis who was admitted 6/14 with progressive dyspnea (since March of 2016).  CT with new bilateral airspace disease.  PCCM consulted for evaluation.   SIGNIFICANT EVENTS  6/14  Admit with progressive dyspnea 6/16  FOB with bronchial washings>>neg for pcp   STUDIES:  6/14 CT Chest >> new bilateral airspace disease predominately in upper lobes, sub-pleural sparing  SUBJECTIVE:  Cough improved  No chest pain. Dyspnea on exertion On high dose IV medrol  VITAL SIGNS: Temp:  [98 F (36.7 C)-98.2 F (36.8 C)] 98 F (36.7 C) (06/20 0551) Pulse Rate:  [88-97] 88 (06/20 0551) Resp:  [18-20] 20 (06/20 0551) BP: (151-170)/(77-93) 155/77 mmHg (06/20 0551) SpO2:  [89 %-95 %] 95 % (06/20 1109)  PHYSICAL EXAMINATION: General: WNWD AAM in no acute distress Neuro:  AAOx4, speech clear and articulate HEENT:  NO LAN/JVD, Oral mucosa clear, good dentition  Cardiovascular:  S1S2 rrr, no mumur Lungs:  Even/non-labored, decreased crackles in upper lobes, LLL but improved Abdomen:  Soft +bs Musculoskeletal:  Intact Skin:  Warm and dry   Recent Labs Lab 02/04/15 1945 02/07/15 0428  NA 135 138  K 4.6 4.4  CL 102 106  CO2 22 22  BUN 21* 18  CREATININE 1.07 0.97  GLUCOSE 417* 294*    Recent Labs Lab 02/04/15 1945 02/07/15 0428  HGB 10.2* 9.6*  HCT 31.8* 30.4*  WBC 11.2* 7.3  PLT 258 201   No results found. BAL neg for PCP  ASSESSMENT:    Lung infiltrate on CT- bilateral air space disease , neg BAL for PCP   Doubt Methotrexate toxicity, with cellular features of BAL   Dyspnea - progressive, on exertion since March 2016   Cough x 3 mos with brown sputum   Dermatomyositis dx 3/16, on prednisone and post 2 cycles of methotrexate, last 6/13   Benign  essential HTN   DM type 2, goal A1c below 7   Sickle cell trait    DDx  - methotrexate injury vs NSIP from dermatomyositis.   Note BAL NEG for PCP Suspect infiltrates are inflammatory d/t dermatomyositis with pneumonitis.   PLAN  O2 as needed to supports saturations > 90% Cont Budesonide, claritin, flonase, singulair Cont  pulse dose solumedrol (250mg  IV q6h x3 days) followed by prednisone 60mg  > after pulse dose steroids would favor stopping methotrexate and changing to oral cytoxan 2mg /kg/day;  Discuss with  Rheumatologist Dr. Zenovia Jordan at discharge > will need to be discharged ON PCP prophylaxis with bactrim D/c doxycycline Needs outpt pulm f/u with dr Kendrick Fries arranged   6/16 FOB results:  BAL culture >>neg PCP >>neg AFB >> neg Cell Ct >> 463 WBC, 65% mono, 27% pmn, 8% lymph, 0% eos Cytology >> neg for CA  Flow Cytometry >>pending  BAL Fungal Culture >>ng  Hyperglycemia , steroid worsened - per [facility]  HTN, DM, Anemia, GERD, Dermatomyositis per primary MD  Patient  updated at bedside.    [facility] V.MD Cyril Mourning MD. Tonny Bollman. [place] [facility] Pager [phone] If no response call [phone]    12:45 PM 02/10/2015  02/10/2015, 12:45 PM

## 2015-02-11 LAB — CULTURE, BLOOD (ROUTINE X 2)
CULTURE: NO GROWTH
Culture: NO GROWTH

## 2015-02-11 LAB — GLUCOSE, CAPILLARY
GLUCOSE-CAPILLARY: 155 mg/dL — AB (ref 65–99)
GLUCOSE-CAPILLARY: 236 mg/dL — AB (ref 65–99)
GLUCOSE-CAPILLARY: 244 mg/dL — AB (ref 65–99)
Glucose-Capillary: 193 mg/dL — ABNORMAL HIGH (ref 65–99)

## 2015-02-11 MED ORDER — PREDNISONE 50 MG PO TABS
60.0000 mg | ORAL_TABLET | Freq: Every day | ORAL | Status: DC
  Administered 2015-02-12 – 2015-02-13 (×2): 60 mg via ORAL
  Filled 2015-02-11 (×3): qty 1

## 2015-02-11 NOTE — Progress Notes (Signed)
Patient's oxygen level:  Sitting with O2= 95% Sitting without O2=87%  Walking with O2=95% Walking without O2=83%  Patient is stable and back in bed with O2 at 2L. Will continue to monitor

## 2015-02-11 NOTE — Progress Notes (Signed)
   Name: Derek Anthony MRN: [id number] DOB: 17-Sep-1955    ADMISSION DATE:  02/04/2015 CONSULTATION DATE: 6/15  REFERRING MD :  [facility]  CHIEF COMPLAINT:  SOB  BRIEF PATIENT DESCRIPTION: 59 y/o M with recent diagnosis of dermatomyositis who was admitted 6/14 with progressive dyspnea (since March of 2016).  CT with new bilateral airspace disease.  PCCM consulted for evaluation.   SIGNIFICANT EVENTS  6/14  Admit with progressive dyspnea 6/16  FOB with bronchial washings>>neg for pcp   STUDIES:  6/14 CT Chest >> new bilateral airspace disease predominately in upper lobes, sub-pleural sparing  SUBJECTIVE:  Afebrile -on O2 3 L/m Cough improved  No chest pain.  C/o Dyspnea on exertion Completed high dose IV medrol Sugars & BP high  VITAL SIGNS: Temp:  [97.9 F (36.6 C)-98.9 F (37.2 C)] 97.9 F (36.6 C) (06/21 0508) Pulse Rate:  [88-102] 88 (06/21 0508) Resp:  [18-20] 20 (06/21 0508) BP: (151-160)/(78-92) 160/92 mmHg (06/21 0508) SpO2:  [92 %-96 %] 96 % (06/21 0738)  PHYSICAL EXAMINATION: General: WNWD AAM in no acute distress Neuro:  AAOx4, speech clear and articulate HEENT:  NO LAN/JVD, Oral mucosa clear, good dentition  Cardiovascular:  S1S2 rrr, no mumur Lungs:  Even/non-labored, bibasal crackles Abdomen:  Soft +bs Musculoskeletal:  Intact Skin:  Warm and dry   Recent Labs Lab 02/04/15 1945 02/07/15 0428  NA 135 138  K 4.6 4.4  CL 102 106  CO2 22 22  BUN 21* 18  CREATININE 1.07 0.97  GLUCOSE 417* 294*    Recent Labs Lab 02/04/15 1945 02/07/15 0428  HGB 10.2* 9.6*  HCT 31.8* 30.4*  WBC 11.2* 7.3  PLT 258 201   No results found. 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 Zenovia Jordan at discharge - about options incl oral cytoxan 2mg /kg/day; awaiting call back > will need to be discharged ON PCP prophylaxis with bactrim Needs outpt pulm f/u with dr Kendrick Fries arranged   6/16 FOB results:  BAL culture >>neg PCP >>neg AFB >> neg Cell Ct >> 463 WBC, 65% mono, 27% pmn, 8% lymph, 0% eos Cytology >> neg for CA  Flow Cytometry >>non diagnostic BAL Fungal Culture >>ng  Hyperglycemia , steroid worsened - per [facility]  HTN, DM, Anemia, GERD, Dermatomyositis per primary MD  Patient  updated at bedside.    [facility] V.MD Cyril Mourning MD. Tonny Bollman. [place] [facility] Pager [phone] If no response call [phone]    10:08 AM 02/11/2015

## 2015-02-11 NOTE — Progress Notes (Signed)
[facility] PROGRESS NOTE  MICHAH MINTON WUJ:[id number] DOB: 1956-05-07 DOA: 02/04/2015 PCP: [name], MD  Assessment/Plan: 1-hypoxemic respiratory failure with worsening infiltrates in a patient with baseline dermatomyositis: No fever, no chest pain. Patient with shortness of breath mainly on x-ray showed and productive cough -CT chest demonstrating groundglass patchy consolidation -S/P bronchoscopy; BAL neg for PCP. Follow pulmonologist rec's will start prednisone  daily and use of cytoxan (last one waiting final rec's from [name]). Will also need treatment with bactrim for PCP prophylaxis at discharge. Pulmonology on board will follow tjeir rec's -Sputum cultures non-representative of lower resp tract -Doxycycline has been disocntinued, no signs of infection on BAL -Continue supportive care, as needed oxygen supplementation and the use of Pulmicort -will continue flutter valve -continue PRN oxygen supplementation and assess home oxygen need  2-hypertension: stable and under fair control now -Continue current antihypertensive regimen -will adjust as needed -expecting steroids decrease to help BP as well  3-diabetes mellitus with neuropathy: On insulin therapy at home -no further hypoglycemia, CBG's running high due to steroids therapy -Will continue the use of 70/30 and Lantus, last one will be increase to 40 units for better control -on SSI as well -expecting improvement with lower dose of steroids   4-Neuropathy: Will continue the use of gabapentin  5-Allergic rhinitis: Will continue the use of Claritin and Flonase  6-dermatomyositis: Methotrexate on hold; getting high dose steroids for NISP/pneumonitis -at discharge plan is for prednisone  daily and cytoxan -per Pulmonary service they will contact [name] at discharge with updates, and for any other rec's.  7- GERD/GI protection: Will continue Protonix   Code Status: Full code Family  Communication: Wife at bedside Disposition Plan: To be determine; for now remains inpatient   Consultants:  Pulmonary service  Procedures:  See below for x-rays reports  Bronchoscopy done on 6/16 (BAL neg for PCP)  Antibiotics:  Doxycycline 6/14>>6/18  HPI/Subjective: Afebrile, patient with another episode of SOB and hypoxia while doing activity on RA. No CP, no N/V  Objective: Filed Vitals:   02/11/15 1358  BP: 147/84  Pulse: 108  Temp: 99 F (37.2 C)  Resp: 16    Intake/Output Summary (Last 24 hours) at 02/11/15 2225 Last data filed at 02/11/15 1700  Gross per 24 hour  Intake    104 ml  Output   1425 ml  Net  -1321 ml   Filed Weights   02/04/15 1935 02/05/15 0352  Weight: 100.699 kg (222 lb) 103.329 kg (227 lb 12.8 oz)    Exam:   General:  Afebrile, feeling better overall, but continue experiencing SOB with activity. No fever, no nausea, no vomiting   Cardiovascular: S1 and S2, no rubs, no gallops  Respiratory: No wheezing, no crackles, positive rhonchi  Abdomen: Soft, nontender, nondistended, positive bowel sounds  Musculoskeletal: No edema, no cyanosis or clubbing.  Data Reviewed: Basic Metabolic Panel:  Recent Labs Lab 02/07/15 0428  NA 138  K 4.4  CL 106  CO2 22  GLUCOSE 294*  BUN 18  CREATININE 0.97  CALCIUM 8.0*   Liver Function Tests: No results for input(s): AST, ALT, ALKPHOS, BILITOT, PROT, ALBUMIN in the last 168 hours. CBC:  Recent Labs Lab 02/07/15 0428  WBC 7.3  HGB 9.6*  HCT 30.4*  MCV 82.2  PLT 201   CBG:  Recent Labs Lab 02/10/15 2252 02/11/15 0818 02/11/15 1232 02/11/15 1712 02/11/15 2041  GLUCAP 262* 155* 193* 236* 244*    Recent Results (from the  past 240 hour(s))  Culture, blood (routine x 2)     Status: None   Collection Time: 02/04/15  9:00 PM  Result Value Ref Range Status   Specimen Description BLOOD RIGHT ARM  Final   Special Requests BOTTLES DRAWN AEROBIC AND ANAEROBIC 5CC EA  Final    Culture   Final    NO GROWTH 5 DAYS Performed at [facility]    Report Status 02/11/2015 FINAL  Final  Culture, blood (routine x 2)     Status: None   Collection Time: 02/04/15  9:15 PM  Result Value Ref Range Status   Specimen Description BLOOD LEFT ARM  Final   Special Requests BOTTLES DRAWN AEROBIC AND ANAEROBIC 5CC EA  Final   Culture   Final    NO GROWTH 5 DAYS Performed at [facility]    Report Status 02/11/2015 FINAL  Final  Culture, sputum-assessment     Status: None   Collection Time: 02/06/15 11:27 AM  Result Value Ref Range Status   Specimen Description SPUTUM  Final   Special Requests NONE  Final   Sputum evaluation   Final    MICROSCOPIC FINDINGS SUGGEST THAT THIS SPECIMEN IS NOT REPRESENTATIVE OF LOWER RESPIRATORY SECRETIONS. PLEASE RECOLLECT. Results Called to: R L'ESPERANCE,RN AT 1205 02/06/15 BY L BENFIELD    Report Status 02/06/2015 FINAL  Final  Culture, respiratory (NON-Expectorated)     Status: None   Collection Time: 02/06/15 12:38 PM  Result Value Ref Range Status   Specimen Description BRONCHIAL ALVEOLAR LAVAGE  Final   Special Requests Immunocompromised  Final   Gram Stain   Final    RARE WBC PRESENT,BOTH PMN AND MONONUCLEAR NO SQUAMOUS EPITHELIAL CELLS SEEN NO ORGANISMS SEEN Performed at [facility]    Culture   Final    Non-Pathogenic Oropharyngeal-type Flora Isolated. Performed at [facility]    Report Status 02/09/2015 FINAL  Final  Fungus Culture with Smear     Status: None (Preliminary result)   Collection Time: 02/06/15 12:38 PM  Result Value Ref Range Status   Specimen Description BRONCHIAL ALVEOLAR LAVAGE  Final   Special Requests Immunocompromised  Final   Fungal Smear   Final    NO YEAST OR FUNGAL ELEMENTS SEEN Performed at [facility]    Culture   Final    CULTURE IN PROGRESS FOR FOUR WEEKS Performed at [facility]    Report Status PENDING  Incomplete  AFB culture with  smear     Status: None (Preliminary result)   Collection Time: 02/06/15 12:38 PM  Result Value Ref Range Status   Specimen Description BRONCHIAL ALVEOLAR LAVAGE  Final   Special Requests Immunocompromised  Final   Acid Fast Smear   Final    NO ACID FAST BACILLI SEEN Performed at [facility]    Culture   Final    CULTURE WILL BE EXAMINED FOR 6 WEEKS BEFORE ISSUING A FINAL REPORT Performed at [facility]    Report Status PENDING  Incomplete  Pneumocystis smear by DFA     Status: None   Collection Time: 02/06/15 12:38 PM  Result Value Ref Range Status   Specimen Source-[facility] BRONCHIAL ALVEOLAR LAVAGE  Final   Pneumocystis jiroveci Ag NEGATIVE  Final    Comment: Performed at [facility]     Studies: No results found.  Scheduled Meds: . allopurinol  300 mg Oral Daily  . aspirin EC  81 mg  Oral Daily  . budesonide (PULMICORT) nebulizer solution  0.25 mg Nebulization BID  . fluticasone  2 spray Each Nare Daily  . folic acid  1 mg Oral Daily  . gabapentin  1,200 mg Oral Daily  . gabapentin  600 mg Oral QHS  . heparin  5,000 Units Subcutaneous 3 times per day  . insulin aspart  0-20 Units Subcutaneous TID AC & HS  . insulin aspart protamine- aspart  40 Units Subcutaneous BID WC  . insulin glargine  40 Units Subcutaneous BID  . irbesartan  300 mg Oral Daily  . loratadine  10 mg Oral Daily  . meloxicam  7.5 mg Oral Daily  . montelukast  10 mg Oral QHS  . pantoprazole  40 mg Oral Daily  . [START ON 02/12/2015] predniSONE  60 mg Oral Q breakfast   Continuous Infusions: . sodium chloride 10 mL/hr at 02/06/15 1610    Active Problems:   Cough   Benign essential HTN   DM type 2, goal A1c below 7   Sickle cell trait   Dermatomyositis   Pulmonary infiltrates   Dyspnea on exertion since march 2016   Lung infiltrate on CTm bilateral air space disease   Methotrexate toxicity, possibe afe 2 doses   Time spent: 30 minutes   Vassie Loll  [facility] Pager [phone]. If 7PM-7AM, please contact night-coverage at [email or web address], password [facility] 02/11/2015, 10:25 PM  LOS: 6 days

## 2015-02-12 LAB — GLUCOSE, CAPILLARY
Glucose-Capillary: 190 mg/dL — ABNORMAL HIGH (ref 65–99)
Glucose-Capillary: 277 mg/dL — ABNORMAL HIGH (ref 65–99)
Glucose-Capillary: 337 mg/dL — ABNORMAL HIGH (ref 65–99)
Glucose-Capillary: 242 mg/dL — ABNORMAL HIGH (ref 65–99)

## 2015-02-12 MED ORDER — INSULIN GLARGINE 100 UNIT/ML ~~LOC~~ SOLN
45.0000 [IU] | Freq: Two times a day (BID) | SUBCUTANEOUS | Status: DC
  Administered 2015-02-12: 45 [IU] via SUBCUTANEOUS
  Filled 2015-02-12 (×3): qty 0.45

## 2015-02-12 NOTE — Progress Notes (Signed)
[facility] PROGRESS NOTE  Derek Anthony ZOX:[id number] DOB: [birth year] DOA: 02/04/2015 PCP: [name], MD  Assessment/Plan: 1-hypoxemic respiratory failure with worsening infiltrates in a patient with baseline dermatomyositis: No fever, no chest pain. Patient with shortness of breath mainly on x-ray showed and productive cough -CT chest demonstrating groundglass patchy consolidation -S/P bronchoscopy; BAL neg for PCP. Follow pulmonologist rec's will start prednisone  daily and use of cytoxan (last one waiting final rec's from [name]). Will also need treatment with bactrim for PCP prophylaxis at discharge. Pulmonology on board will follow tjeir rec's -Sputum cultures non-representative of lower resp tract -Doxycycline has been disocntinued, no signs of infection on BAL -Continue supportive care, as needed oxygen supplementation and the use of Pulmicort -will continue flutter valve -will arrange for home oxygen   2-hypertension: stable and under fair control now -Continue current antihypertensive regimen -will adjust as needed -expecting steroids decrease to help BP as well  3-diabetes mellitus with neuropathy: On insulin therapy at home -no further hypoglycemia, CBG's running high due to steroids therapy -Will continue the use of 70/30 and Lantus, last one will be increased to 45 units for better control -on SSI as well -expecting improvement with lower dose of steroids   4-Neuropathy: Will continue the use of gabapentin  5-Allergic rhinitis: Will continue the use of Claritin and Flonase  6-dermatomyositis: Methotrexate on hold; getting high dose steroids for NISP/pneumonitis -at discharge plan is for prednisone  daily and cytoxan -per Pulmonary service they will contact [name] at discharge with updates, and for any other rec's.  7- GERD/GI protection: Will continue Protonix   Code Status: Full code Family Communication: Wife at bedside Disposition  Plan: home in am if remains stable   Consultants:  Pulmonary service  ID (Dr. Luciana Axe; recommended use of Dapsone for PCP prophylaxis)  Procedures:  See below for x-rays reports  Bronchoscopy done on 6/16 (BAL neg for PCP)  Antibiotics:  Doxycycline 6/14>>6/18  HPI/Subjective: Afebrile, patient with another episode of SOB and hypoxia while doing activity on RA (83%); with O2 supplementation patient is 93-94%. No CP, no N/V  Objective: Filed Vitals:   02/12/15 1408  BP: 123/65  Pulse: 114  Temp: 98.6 F (37 C)  Resp: 18    Intake/Output Summary (Last 24 hours) at 02/12/15 1954 Last data filed at 02/12/15 1844  Gross per 24 hour  Intake    700 ml  Output    675 ml  Net     25 ml   Filed Weights   02/04/15 1935 02/05/15 0352  Weight: 100.699 kg (222 lb) 103.329 kg (227 lb 12.8 oz)    Exam:   General:  Afebrile, feeling better overall, but continue experiencing SOB with activity. Patient has failed maintaining O2 sat on RA > 88%. No fever  Cardiovascular: S1 and S2, no rubs, no gallops  Respiratory: No wheezing, no crackles, positive rhonchi  Abdomen: Soft, nontender, nondistended, positive bowel sounds  Musculoskeletal: No edema, no cyanosis or clubbing.  Data Reviewed: Basic Metabolic Panel:  Recent Labs Lab 02/07/15 0428  NA 138  K 4.4  CL 106  CO2 22  GLUCOSE 294*  BUN 18  CREATININE 0.97  CALCIUM 8.0*   CBC:  Recent Labs Lab 02/07/15 0428  WBC 7.3  HGB 9.6*  HCT 30.4*  MCV 82.2  PLT 201   CBG:  Recent Labs Lab 02/11/15 1712 02/11/15 2041 02/12/15 0800 02/12/15 1152 02/12/15 1650  GLUCAP 236* 244* 190* 277* 337*  Recent Results (from the past 240 hour(s))  Culture, blood (routine x 2)     Status: None   Collection Time: 02/04/15  9:00 PM  Result Value Ref Range Status   Specimen Description BLOOD RIGHT ARM  Final   Special Requests BOTTLES DRAWN AEROBIC AND ANAEROBIC 5CC EA  Final   Culture   Final    NO GROWTH 5  DAYS Performed at [facility]    Report Status 02/11/2015 FINAL  Final  Culture, blood (routine x 2)     Status: None   Collection Time: 02/04/15  9:15 PM  Result Value Ref Range Status   Specimen Description BLOOD LEFT ARM  Final   Special Requests BOTTLES DRAWN AEROBIC AND ANAEROBIC 5CC EA  Final   Culture   Final    NO GROWTH 5 DAYS Performed at [facility]    Report Status 02/11/2015 FINAL  Final  Culture, sputum-assessment     Status: None   Collection Time: 02/06/15 11:27 AM  Result Value Ref Range Status   Specimen Description SPUTUM  Final   Special Requests NONE  Final   Sputum evaluation   Final    MICROSCOPIC FINDINGS SUGGEST THAT THIS SPECIMEN IS NOT REPRESENTATIVE OF LOWER RESPIRATORY SECRETIONS. PLEASE RECOLLECT. Results Called to: R L'ESPERANCE,RN AT 1205 02/06/15 BY L BENFIELD    Report Status 02/06/2015 FINAL  Final  Culture, respiratory (NON-Expectorated)     Status: None   Collection Time: 02/06/15 12:38 PM  Result Value Ref Range Status   Specimen Description BRONCHIAL ALVEOLAR LAVAGE  Final   Special Requests Immunocompromised  Final   Gram Stain   Final    RARE WBC PRESENT,BOTH PMN AND MONONUCLEAR NO SQUAMOUS EPITHELIAL CELLS SEEN NO ORGANISMS SEEN Performed at [facility]    Culture   Final    Non-Pathogenic Oropharyngeal-type Flora Isolated. Performed at [facility]    Report Status 02/09/2015 FINAL  Final  Fungus Culture with Smear     Status: None (Preliminary result)   Collection Time: 02/06/15 12:38 PM  Result Value Ref Range Status   Specimen Description BRONCHIAL ALVEOLAR LAVAGE  Final   Special Requests Immunocompromised  Final   Fungal Smear   Final    NO YEAST OR FUNGAL ELEMENTS SEEN Performed at [facility]    Culture   Final    CULTURE IN PROGRESS FOR FOUR WEEKS Performed at [facility]    Report Status PENDING  Incomplete  AFB culture with smear     Status: None (Preliminary  result)   Collection Time: 02/06/15 12:38 PM  Result Value Ref Range Status   Specimen Description BRONCHIAL ALVEOLAR LAVAGE  Final   Special Requests Immunocompromised  Final   Acid Fast Smear   Final    NO ACID FAST BACILLI SEEN Performed at [facility]    Culture   Final    CULTURE WILL BE EXAMINED FOR 6 WEEKS BEFORE ISSUING A FINAL REPORT Performed at [facility]    Report Status PENDING  Incomplete  Pneumocystis smear by DFA     Status: None   Collection Time: 02/06/15 12:38 PM  Result Value Ref Range Status   Specimen Source-[facility] BRONCHIAL ALVEOLAR LAVAGE  Final   Pneumocystis jiroveci Ag NEGATIVE  Final    Comment: Performed at [facility]     Studies: No results found.  Scheduled Meds: . allopurinol  300 mg Oral Daily  . aspirin  EC  81 mg Oral Daily  . fluticasone  2 spray Each Nare Daily  . folic acid  1 mg Oral Daily  . gabapentin  1,200 mg Oral Daily  . gabapentin  600 mg Oral QHS  . heparin  5,000 Units Subcutaneous 3 times per day  . insulin aspart  0-20 Units Subcutaneous TID AC & HS  . insulin aspart protamine- aspart  40 Units Subcutaneous BID WC  . insulin glargine  40 Units Subcutaneous BID  . irbesartan  300 mg Oral Daily  . loratadine  10 mg Oral Daily  . meloxicam  7.5 mg Oral Daily  . montelukast  10 mg Oral QHS  . pantoprazole  40 mg Oral Daily  . predniSONE  60 mg Oral Q breakfast   Continuous Infusions: . sodium chloride 10 mL/hr at 02/06/15 3329    Active Problems:   Cough   Benign essential HTN   DM type 2, goal A1c below 7   Sickle cell trait   Dermatomyositis   Pulmonary infiltrates   Dyspnea on exertion since march 2016   Lung infiltrate on CTm bilateral air space disease   Methotrexate toxicity, possibe afe 2 doses   Time spent: 30 minutes   Vassie Loll  [facility] Pager [phone]. If 7PM-7AM, please contact night-coverage at [email or web address], password [facility] 02/12/2015, 7:54 PM   LOS: 7 days

## 2015-02-12 NOTE — Evaluation (Signed)
Physical Therapy Evaluation Patient Details Name: Derek Anthony MRN: [id number] DOB: 1955/09/19 Today's Date: 02/12/2015   History of Present Illness  Derek Anthony is a 59 y.o. male with recent diagnosis of dermatomyositis. Started MTX 1 week ago Monday. Patient presents to ED with 3 month history of ongoing SOB since his discharge from the hospital in March. He has very minimal cough, non-productive. Becomes winded when going up a flight of stairs. He is certain that his SOB symptoms significantly predate the point in time when he started MTX. His symptoms have not worsened or changed since starting MTX.  Clinical Impression  Pt admitted with above diagnosis. Pt currently with functional limitations due to the deficits listed below (see PT Problem List). Pt will benefit from skilled PT to increase their independence and safety with mobility to allow discharge to the venue listed below.  Pt fatigues quickly but is very motivated to keep his independence and interested in any DME that can help him achieve this.  Recommend 3-1 BSC and HHPT to assess home environment and A pt in set-up to make things easier.  Pt reports that going upstairs to his bedroom exhausts him and that [place] will cover cost of chair/stair lift if MD writes prescription.    Pt's o2 dropped to 82% on 2 L/min with gait and stayed 89% on 4 L/min with second half of gait.     Follow Up Recommendations Home health PT    Equipment Recommendations  3in1 (PT)    Recommendations for Other Services OT consult     Precautions / Restrictions Precautions Precaution Comments: de-sats quickly      Mobility  Bed Mobility Overal bed mobility: Modified Independent                Transfers Overall transfer level: Modified independent Equipment used: Straight cane                Ambulation/Gait Ambulation/Gait assistance: Min guard Ambulation Distance (Feet): 86 Feet (x 2 reps) Assistive device: Straight  cane Gait Pattern/deviations: Step-through pattern;Drifts right/left     General Gait Details: Pt ambulated on 2 L/min for 86' then rest break and checked o2 and 82%. Increased to 3, then 4L/min and it went to 89% with education on pursed lip breathing.  Walked 42' back and o2 89%.  Once sitting, changed back to 2 L/min and 92% when PT left.  Stairs            Wheelchair Mobility    Modified Rankin (Stroke Patients Only)       Balance Overall balance assessment: Needs assistance   Sitting balance-Leahy Scale: Good       Standing balance-Leahy Scale: Fair                               Pertinent Vitals/Pain Pain Assessment: 0-10 Pain Score: 8  Pain Location: arthritic pain in joints, dermatomyositis pain in espohagus, sores on elbows and buttocks Pain Descriptors / Indicators: Burning;Aching Pain Intervention(s): Monitored during session;Repositioned    Home Living Family/patient expects to be discharged to:: Private residence Living Arrangements: Spouse/significant other Available Help at Discharge: Available PRN/intermittently;Family (wife works as school Child psychotherapist) Type of Home: [employer] Access: Level entry     Home Layout: Two level;1/2 bath on main level Home Equipment: Cane - single point      Prior Function Level of Independence: Independent  Comments: Pt reports that he gets very fatigued with everything, but is independent.  1 fall when outside due to wet grass.     Hand Dominance   Dominant Hand: Right    Extremity/Trunk Assessment   Upper Extremity Assessment: Overall WFL for tasks assessed           Lower Extremity Assessment: Overall WFL for tasks assessed      Cervical / Trunk Assessment: Normal  Communication   Communication: No difficulties  Cognition Arousal/Alertness: Awake/alert Behavior During Therapy: WFL for tasks assessed/performed Overall Cognitive Status: Within Functional Limits for tasks  assessed                      General Comments General comments (skin integrity, edema, etc.): Pt very open to any ideas to maximize his independence and to make things easier.    Exercises        Assessment/Plan    PT Assessment Patient needs continued PT services  PT Diagnosis Generalized weakness   PT Problem List Decreased activity tolerance;Decreased balance;Cardiopulmonary status limiting activity;Decreased mobility  PT Treatment Interventions Gait training;Functional mobility training;Therapeutic activities;Therapeutic exercise;Stair training;DME instruction;Balance training   PT Goals (Current goals can be found in the Care Plan section) Acute Rehab PT Goals Patient Stated Goal: To get endurance back PT Goal Formulation: With patient Time For Goal Achievement: 02/26/15 Potential to Achieve Goals: Good    Frequency Min 3X/week   Barriers to discharge        Co-evaluation               End of Session Equipment Utilized During Treatment: Gait belt;Oxygen Activity Tolerance: Treatment limited secondary to medical complications (Comment) (de-sat) Patient left: in chair;with call [place]/phone within reach Nurse Communication: Mobility status;Other (comment) (o2 sats)         Time: 9628-3662 PT Time Calculation (min) (ACUTE ONLY): 37 min   Charges:   PT Evaluation $Initial PT Evaluation Tier I: 1 Procedure PT Treatments $Gait Training: 8-22 mins   PT G Codes:        Dennis Hegeman LUBECK 02/12/2015, 1:04 PM

## 2015-02-12 NOTE — Progress Notes (Signed)
SATURATION QUALIFICATIONS: (This note is used to comply with regulatory documentation for home oxygen)   Patient Saturations on 2 Liters of oxygen while Ambulating = 82%  o2 on 4 L/min with ambulation 89%

## 2015-02-12 NOTE — Progress Notes (Addendum)
   Name: Derek Anthony MRN: [id number] DOB: [birth year]    ADMISSION DATE:  02/04/2015 CONSULTATION DATE: 6/15  REFERRING MD :  [facility]  CHIEF COMPLAINT:  SOB  BRIEF PATIENT DESCRIPTION: 59 y/o M with recent diagnosis of dermatomyositis who was admitted 6/14 with progressive dyspnea (since March of 2016).  CT with new bilateral airspace disease.  PCCM consulted for evaluation.   SIGNIFICANT EVENTS  6/14  Admit with progressive dyspnea 6/16  FOB with bronchial washings>>neg for pcp   STUDIES:  6/14 CT Chest >> new bilateral airspace disease predominately in upper lobes, sub-pleural sparing  SUBJECTIVE:  Afebrile -on O2 2 L/m Cough improved  No chest pain.  desatn requiring O2   VITAL SIGNS: Temp:  [97.9 F (36.6 C)-99 F (37.2 C)] 97.9 F (36.6 C) (06/22 0507) Pulse Rate:  [78-109] 82 (06/22 0616) Resp:  [16-18] 18 (06/22 0507) BP: (147-179)/(84-97) 179/90 mmHg (06/22 0616) SpO2:  [88 %-95 %] 95 % (06/22 0939)  PHYSICAL EXAMINATION: General: WNWD AAM in no acute distress Neuro:  AAOx4, speech clear and articulate HEENT:  NO LAN/JVD, Oral mucosa clear, good dentition  Cardiovascular:  S1S2 rrr, no mumur Lungs:  Even/non-labored, bibasal crackles Abdomen:  Soft +bs Musculoskeletal:  Intact Skin:  Warm and dry   Recent Labs Lab 02/07/15 0428  NA 138  K 4.4  CL 106  CO2 22  BUN 18  CREATININE 0.97  GLUCOSE 294*    Recent Labs Lab 02/07/15 0428  HGB 9.6*  HCT 30.4*  WBC 7.3  PLT 201   No results found. 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 Zenovia Jordan- about options incl oral cytoxan 2mg /kg/day; I left message 6/22 awaiting call back > will need  PCP prophylaxis - allergic to sulfa - may have to ask ID for other options - outpt pulm f/u with dr Kendrick Fries arranged    Hyperglycemia , steroid worsened - per [facility]  HTN, DM, Anemia, GERD, Dermatomyositis per primary MD  Patient  updated at bedside.  PCCM to sign off   [facility] V.MD Cyril Mourning MD. [facility]. [place] [facility] Pager [phone] If no response call [phone]    10:35 AM 02/12/2015

## 2015-02-12 NOTE — Progress Notes (Signed)
Derek Anthony with [facility] notified of new oxygen set up and DC planned for tomorrow.

## 2015-02-12 NOTE — Hospital Discharge Follow-Up (Signed)
Transitional Care Clinic:  This Case Manager received communication from Carles Collet, RN CM that patient may benefit from [facility].  Met with patient at bedside to discuss the medical management provided at the [facility]. Patient declines post-discharge follow-up at the [facility] and indicates he wants to continue seeing his PCP Dr. Dagmar Hait after discharge.  Carles Collet, RN CM updated.

## 2015-02-13 LAB — GLUCOSE, CAPILLARY
GLUCOSE-CAPILLARY: 90 mg/dL (ref 65–99)
GLUCOSE-CAPILLARY: 158 mg/dL — AB (ref 65–99)
Glucose-Capillary: 31 mg/dL — CL (ref 65–99)

## 2015-02-13 MED ORDER — INSULIN GLARGINE 100 UNIT/ML ~~LOC~~ SOLN: 42.0000 [IU] | Freq: Two times a day (BID) | SUBCUTANEOUS | Status: AC

## 2015-02-13 MED ORDER — PREDNISONE 20 MG PO TABS: 60.0000 mg | ORAL_TABLET | Freq: Every day | ORAL | Status: AC

## 2015-02-13 MED ORDER — FLUTICASONE PROPIONATE HFA 44 MCG/ACT IN AERO: 1.0000 | INHALATION_SPRAY | Freq: Two times a day (BID) | RESPIRATORY_TRACT | Status: AC

## 2015-02-13 MED ORDER — OLMESARTAN MEDOXOMIL 40 MG PO TABS: 40.0000 mg | ORAL_TABLET | Freq: Every day | ORAL | Status: AC

## 2015-02-13 MED ORDER — INSULIN ASPART PROT & ASPART (70-30 MIX) 100 UNIT/ML ~~LOC~~ SUSP
30.0000 [IU] | Freq: Once | SUBCUTANEOUS | Status: AC
  Administered 2015-02-13: 30 [IU] via SUBCUTANEOUS
  Filled 2015-02-13: qty 10

## 2015-02-13 MED ORDER — MONTELUKAST SODIUM 10 MG PO TABS: 10.0000 mg | ORAL_TABLET | Freq: Every day | ORAL | Status: AC

## 2015-02-13 MED ORDER — OMEPRAZOLE 40 MG PO CPDR: 40.0000 mg | DELAYED_RELEASE_CAPSULE | Freq: Every day | ORAL | Status: AC

## 2015-02-13 MED ORDER — GABAPENTIN 600 MG PO TABS: 600.0000 mg | ORAL_TABLET | Freq: Two times a day (BID) | ORAL | Status: AC

## 2015-02-13 MED ORDER — ALLOPURINOL 300 MG PO TABS: 300.0000 mg | ORAL_TABLET | Freq: Every day | ORAL | Status: AC

## 2015-02-13 MED ORDER — FLUTICASONE PROPIONATE 50 MCG/ACT NA SUSP: 2.0000 | Freq: Every day | NASAL | Status: AC

## 2015-02-13 MED ORDER — INSULIN GLARGINE 100 UNIT/ML ~~LOC~~ SOLN
40.0000 [IU] | Freq: Two times a day (BID) | SUBCUTANEOUS | Status: DC
  Administered 2015-02-13: 40 [IU] via SUBCUTANEOUS
  Filled 2015-02-13 (×2): qty 0.4

## 2015-02-13 NOTE — Discharge Summary (Signed)
Physician Discharge Summary  Derek Anthony:[id number] DOB: July 21, 1956 DOA: 02/04/2015  PCP: Derek Sauer, MD  Admit date: 02/04/2015 Discharge date: 02/13/2015  Time spent: >30 minutes  Recommendations for Outpatient Follow-up:  1. Reassess patient blood pressure and adjust antihypertensive regimen as needed 2. Follow-up to patient's CBGs and diabetes given changes and adjustment of his steroids dosage 3. Check basic metabolic panel to follow electrolytes and renal function  Discharge Diagnoses:  Active Problems:   Cough   Benign essential HTN   DM type 2, goal A1c below 7   Sickle cell trait   Dermatomyositis   Pulmonary infiltrates   Dyspnea on exertion since march 2016   Lung infiltrate on CTm bilateral air space disease   Methotrexate toxicity, possibe afe 2 doses   Healthcare-associated pneumonia   Pneumonitis   Discharge Condition: A stable and improved. Patient has been discharged home with home health arrangement for oxygen supplementation and has an appointment to follow with Dr. Kendrick Anthony and Dr. Nickola Anthony next week for further evaluation and/treatment of his dermatomyositis and pneumonitis.  Diet recommendation: Heart healthy diet and low carbohydrate diet.  Filed Weights   02/04/15 1935 02/05/15 0352  Weight: 100.699 kg (222 lb) 103.329 kg (227 lb 12.8 oz)    History of present illness:  59 y.o. male with recent diagnosis of dermatomyositis. Started MTX 1 week ago Monday. Patient presents to ED with 3 month history of ongoing SOB since his discharge from the hospital in March. He has very minimal cough, non-productive. Becomes winded when going up a flight of stairs. He is certain that his SOB symptoms significantly predate the point in time when he started MTX. His symptoms have not worsened or changed since starting MTX.  Hospital Course:  1-hypoxemic respiratory failure with worsening infiltrates in a patient with baseline dermatomyositis: No fever, no  chest pain. Patient with shortness of breath mainly on x-ray showed and productive cough -CT chest demonstrating groundglass patchy consolidation -S/P bronchoscopy; BAL neg for PCP. Follow pulmonologist rec's patient received 3 days of high-dose steroids (using Solu-Medrol 250 mg every 6 hours) and subsequently discharged on prednisone 60 mg by mouth daily. Initial plan was to discharge the patient on Cytoxan or after discussing with Dr. Lendon Anthony this recommendations the plan is for the patient to follow with rheumatology service and pulmonology in the outpatient setting and decide at that time which one would the best immunosuppressant agent to be use for his condition. -Doxycycline was discontinued after 4 days as there was no signs of infection on BAL -Continue supportive care, as needed oxygen supplementation and the use of Pulmicort -will continue flutter valve -will arrange for home oxygen   2-hypertension: stable and under fair control now -Continue current antihypertensive regimen -Advised to follow a heart healthy diet  3-diabetes mellitus with neuropathy: On insulin therapy at home -no further hypoglycemia, CBG's running high due to steroids therapy -Will continue the use of 70/30 and Lantus, last one will be increased to 42 units for better control -expecting a slight higher CBGs with higher dosages of steroids  4-Neuropathy: Will continue the use of gabapentin  5-Allergic rhinitis: Will continue the use of Claritin and Flonase  6-dermatomyositis: Methotrexate on hold; patient received high dose steroids for NISP/pneumonitis -at discharge plan is for prednisone 60mg  daily  -After discussing with rheumatologist (Dr. Nickola Anthony), recommendation was to send the patient home on prednisone 60 mg by mouth only and not to start any immunosuppressants at this moment. Rheumatologist is concerned that  Cytoxan is not the best drug to be used and have too many side effects. Patient has an appointment  with the rheumatologist on 02/20/2015 and also with the pulmonologist Dr. Kendrick Anthony on 02/18/2015 at that moment final decision for future treatment will be taken.  7- GERD/GI protection: Will continue PPI  Procedures:  See below for x-rays reports  Bronchoscopy done on 6/16 (BAL neg for PCP)  Consultations:  Pulmonology  Rheumatology (Dr. Nickola Anthony, curbside for immunosuppressant recommendations at the moment of discharge)   Discharge Exam: Filed Vitals:   02/13/15 1435  BP: 156/74  Pulse: 111  Temp: 98.6 F (37 C)  Resp: 20    General: Afebrile, feeling better overall, but continue experiencing SOB with activity. Patient has failed maintaining O2 sat on RA > 88%. No fever and with good oxygen saturation on 2 L nasal cannula supplementation.  Cardiovascular: S1 and S2, no rubs, no gallops  Respiratory: No wheezing, no crackles, positive rhonchi bilaterally  Abdomen: Soft, nontender, nondistended, positive bowel sounds  Musculoskeletal: No edema, no cyanosis or clubbing.   Discharge Instructions   Discharge Instructions    Diet - low sodium heart healthy    Complete by:  As directed      Discharge instructions    Complete by:  As directed   Take medications as prescribed Please follow with rheumatologist asked arrange (02/20/2015) and also with pulmonologist on 02/18/2015; for further evaluation and treatment decision for your dermatomyositis and lung problems. Increase activity slowly and uses oxygen supplementation as instructed Follow a heart healthy and low carbohydrates diet Keep herself well-hydrated          Current Discharge Medication List    CONTINUE these medications which have CHANGED   Details  allopurinol (ZYLOPRIM) 300 MG tablet Take 1 tablet (300 mg total) by mouth daily. Qty: 30 tablet, Refills: 0    fluticasone (FLONASE) 50 MCG/ACT nasal spray Place 2 sprays into both nostrils daily. Qty: 16 g, Refills: 0    fluticasone (FLOVENT HFA) 44  MCG/ACT inhaler Inhale 1 puff into the lungs 2 (two) times daily. Qty: 1 Inhaler, Refills: 0    gabapentin (NEURONTIN) 600 MG tablet Take 1-2 tablets (600-1,200 mg total) by mouth 2 (two) times daily. TAKES  IN AM AND  IN PM Qty: 90 tablet, Refills: 0    !! insulin glargine (LANTUS) 100 UNIT/ML injection Inject 0.42 mLs (42 Units total) into the skin 2 (two) times daily. Qty: 10 mL, Refills: 0    montelukast (SINGULAIR) 10 MG tablet Take 1 tablet (10 mg total) by mouth at bedtime. Qty: 30 tablet, Refills: 1    olmesartan (BENICAR) 40 MG tablet Take 1 tablet (40 mg total) by mouth daily. Qty: 30 tablet, Refills: 1    omeprazole (PRILOSEC) 40 MG capsule Take 1 capsule (40 mg total) by mouth daily. Qty: 60 capsule, Refills: 1    predniSONE (DELTASONE) 20 MG tablet Take 3 tablets (60 mg total) by mouth daily with breakfast. Qty: 60 tablet, Refills: 0     !! - Potential duplicate medications found. Please discuss with provider.    CONTINUE these medications which have NOT CHANGED   Details  aspirin EC 81 MG tablet Take 81 mg by mouth daily.    cetirizine (ZYRTEC) 10 MG tablet Take 10 mg by mouth every morning.     folic acid (FOLVITE) 1 MG tablet Take 1 mg by mouth daily.    HYDROcodone-acetaminophen (NORCO) 5-325 MG per tablet Take 1-2 tablets by mouth  every 6 (six) hours as needed for moderate pain or severe pain. Qty: 30 tablet, Refills: 0    insulin aspart protamine- aspart (NOVOLOG MIX 70/30) (70-30) 100 UNIT/ML injection Inject 40 Units into the skin 2 (two) times daily.     !! insulin glargine (LANTUS) 100 UNIT/ML injection Inject 0.4 mLs (40 Units total) into the skin at bedtime. Qty: 10 mL, Refills: 11    meloxicam (MOBIC) 7.5 MG tablet Take 1 tablet (7.5 mg total) by mouth daily. Qty: 30 tablet, Refills: 0    Naphazoline-Pheniramine (OPCON-A) 0.027-0.315 % SOLN Apply 1 drop to eye daily as needed (FOR DRY EYES).    traMADol (ULTRAM) 50 MG tablet Take 50 mg  by mouth every 6 (six) hours as needed for moderate pain.     !! - Potential duplicate medications found. Please discuss with provider.    STOP taking these medications     METHOTREXATE SODIUM IJ        Allergies  Allergen Reactions  . Sulfa Antibiotics Itching and Rash    Burning also  . Penicillins Itching and Rash   Follow-up Information    Follow up with Max Fickle, MD On 02/18/2015.   Specialty:  Pulmonary Disease   Why:  130 pm   Contact information:   [address] [place] [address] [phone]       Follow up with [facility].   Why:  Oxygen to be delivered to room prior to discharge.    Contact information:   [address] [place] [address] [phone]       Follow up with Obie Dredge, MD On 02/20/2015.   Specialty:  Rheumatology   Contact information:   [facility] [address] [place] [address] [phone]        The results of significant diagnostics from this hospitalization (including imaging, microbiology, ancillary and laboratory) are listed below for reference.    Significant Diagnostic Studies: Dg Chest 2 View  02/04/2015   CLINICAL DATA:  Shortness of breath and cough for 2 days  EXAM: CHEST  2 VIEW  COMPARISON:  November 02, 2014  FINDINGS: There is patchy infiltrate in both upper lobes and more subtly in the left lower lobe. Heart size and pulmonary vascularity are normal. No adenopathy. No bone lesions.  IMPRESSION: Areas of infiltrate bilaterally, more severe in the right upper lobe than elsewhere. Followup PA and lateral chest radiographs recommended in 3-4 weeks following trial of antibiotic therapy to ensure resolution and exclude underlying malignancy.   Electronically Signed   By: Bretta Bang III M.D.   On: 02/04/2015 20:51   Ct Angio Chest Pe W/cm &/or Wo Cm  02/05/2015   CLINICAL DATA:  Chronic shortness of breath and weakness. Initial encounter.  EXAM:  CT ANGIOGRAPHY CHEST WITH CONTRAST  TECHNIQUE: Multidetector CT imaging of the chest was performed using the standard protocol during bolus administration of intravenous contrast. Multiplanar CT image reconstructions and MIPs were obtained to evaluate the vascular anatomy.  CONTRAST:  OMNIPAQUE IOHEXOL 350 MG/ML SOLN  COMPARISON:  Chest radiograph performed earlier today at 8:46 p.m., and CT of the chest performed 10/26/2014  FINDINGS: There is no evidence of pulmonary embolus.  There is diffuse patchy ground-glass airspace opacification throughout both lungs, most prominent at the upper lung lobes. This may reflect pneumonia or an unusual appearance to pulmonary edema. Atypical infection cannot be excluded. There is only minimal crazy paving, suggesting against  pulmonary alveolar proteinosis or hemorrhage.  There is no evidence of pleural effusion or pneumothorax. No definite masses are identified; no abnormal focal contrast enhancement is seen.  Scattered coronary artery calcifications are seen. The mediastinum is otherwise unremarkable. No mediastinal lymphadenopathy is seen. No pericardial effusion is identified. The great vessels are grossly unremarkable in appearance. No axillary lymphadenopathy is seen. The visualized portions of the thyroid gland are unremarkable in appearance.  A calcified granuloma is noted within the right hepatic lobe. The visualized portions of the liver and spleen are otherwise unremarkable. The gallbladder and visualized portions of the pancreas are within normal limits.  No acute osseous abnormalities are seen.  Review of the MIP images confirms the above findings.  IMPRESSION: 1. No evidence of pulmonary embolus. 2. Diffuse patchy ground-glass airspace opacification throughout both lungs, most prominent at the upper lung lobes. This may reflect pneumonia or an unusual appearance to pulmonary edema. Atypical infection cannot be excluded. Only minimal crazy paving is noted,  suggesting against pulmonary alveolar proteinosis or hemorrhage. 3. Scattered coronary artery calcifications seen.   Electronically Signed   By: Roanna Raider M.D.   On: 02/05/2015 00:39    Microbiology: Recent Results (from the past 240 hour(s))  Culture, blood (routine x 2)     Status: None   Collection Time: 02/04/15  9:00 PM  Result Value Ref Range Status   Specimen Description BLOOD RIGHT ARM  Final   Special Requests BOTTLES DRAWN AEROBIC AND ANAEROBIC 5CC EA  Final   Culture   Final    NO GROWTH 5 DAYS Performed at [facility]    Report Status 02/11/2015 FINAL  Final  Culture, blood (routine x 2)     Status: None   Collection Time: 02/04/15  9:15 PM  Result Value Ref Range Status   Specimen Description BLOOD LEFT ARM  Final   Special Requests BOTTLES DRAWN AEROBIC AND ANAEROBIC 5CC EA  Final   Culture   Final    NO GROWTH 5 DAYS Performed at [facility]    Report Status 02/11/2015 FINAL  Final  Culture, sputum-assessment     Status: None   Collection Time: 02/06/15 11:27 AM  Result Value Ref Range Status   Specimen Description SPUTUM  Final   Special Requests NONE  Final   Sputum evaluation   Final    MICROSCOPIC FINDINGS SUGGEST THAT THIS SPECIMEN IS NOT REPRESENTATIVE OF LOWER RESPIRATORY SECRETIONS. PLEASE RECOLLECT. Results Called to: R L'ESPERANCE,RN AT 1205 02/06/15 BY L BENFIELD    Report Status 02/06/2015 FINAL  Final  Culture, respiratory (NON-Expectorated)     Status: None   Collection Time: 02/06/15 12:38 PM  Result Value Ref Range Status   Specimen Description BRONCHIAL ALVEOLAR LAVAGE  Final   Special Requests Immunocompromised  Final   Gram Stain   Final    RARE WBC PRESENT,BOTH PMN AND MONONUCLEAR NO SQUAMOUS EPITHELIAL CELLS SEEN NO ORGANISMS SEEN Performed at [facility]    Culture   Final    Non-Pathogenic Oropharyngeal-type Flora Isolated. Performed at [facility]    Report Status 02/09/2015 FINAL  Final   Fungus Culture with Smear     Status: None (Preliminary result)   Collection Time: 02/06/15 12:38 PM  Result Value Ref Range Status   Specimen Description BRONCHIAL ALVEOLAR LAVAGE  Final   Special Requests Immunocompromised  Final   Fungal Smear   Final    NO YEAST OR FUNGAL ELEMENTS SEEN Performed at [facility]    Culture   Final    CULTURE IN PROGRESS FOR FOUR WEEKS Performed at [facility]    Report Status PENDING  Incomplete  AFB culture with smear     Status: None (Preliminary result)   Collection Time: 02/06/15 12:38 PM  Result Value Ref Range Status   Specimen Description BRONCHIAL ALVEOLAR LAVAGE  Final   Special Requests Immunocompromised  Final   Acid Fast Smear   Final    NO ACID FAST BACILLI SEEN Performed at [facility]    Culture   Final    CULTURE WILL BE EXAMINED FOR 6 WEEKS BEFORE ISSUING A FINAL REPORT Performed at [facility]    Report Status PENDING  Incomplete  Pneumocystis smear by DFA     Status: None   Collection Time: 02/06/15 12:38 PM  Result Value Ref Range Status   Specimen Source-[facility] BRONCHIAL ALVEOLAR LAVAGE  Final   Pneumocystis jiroveci Ag NEGATIVE  Final    Comment: Performed at [facility]    Labs: Basic Metabolic Panel:  Recent Labs Lab 02/07/15 0428  NA 138  K 4.4  CL 106  CO2 22  GLUCOSE 294*  BUN 18  CREATININE 0.97  CALCIUM 8.0*   CBC:  Recent Labs Lab 02/07/15 0428  WBC 7.3  HGB 9.6*  HCT 30.4*  MCV 82.2  PLT 201    CBG:  Recent Labs Lab 02/12/15 1650 02/12/15 2149 02/13/15 0743 02/13/15 0817 02/13/15 1137  GLUCAP 337* 242* 31* 90 158*    Signed:  Vassie Loll  [facility] 02/13/2015, 4:26 PM

## 2015-02-13 NOTE — Progress Notes (Signed)
NURSING PROGRESS NOTE  Derek Anthony [id number] Discharge Data: 02/13/2015 4:42 PM Attending Provider: Vassie Loll, MD JJO:ACZY,[id number] R, MD   Jerline Pain to be D/C'd Home per MD order with hard copies of all prescriptions, letter requested and signed by MD [facility], and home 02. Patient taken out via wheelchair with wife with all personal belongings.    All IV's will be discontinued and monitored for bleeding.  All belongings will be returned to patient for patient to take home.  Last Documented Vital Signs:  Blood pressure 156/74, pulse 111, temperature 98.6 F (37 C), temperature source Oral, resp. rate 20, height 6\' 1"  (1.854 m), weight 103.329 kg (227 lb 12.8 oz), SpO2 94 %.  Leane Platt RN, BS, BSN

## 2015-02-13 NOTE — Progress Notes (Addendum)
Inpatient Diabetes Program Recommendations  AACE/ADA: New Consensus Statement on Inpatient Glycemic Control (2013)  Target Ranges:  Prepandial:   less than 140 mg/dL      Peak postprandial:   less than 180 mg/dL (1-2 hours)      Critically ill patients:  140 - 180 mg/dL   Results for ADORIAN, CIRILO (MRN [id number]) as of 02/13/2015 09:38  Ref. Range 02/12/2015 08:00 02/12/2015 11:52 02/12/2015 16:50 02/12/2015 21:49 02/13/2015 08:17  Glucose-Capillary Latest Ref Range: 65-99 mg/dL 626 (H) 948 (H) 546 (H) 242 (H) 90    Inpatient Diabetes Program Recommendations Insulin - Basal: As steroids are tapered down may have to decrease basal insulin to home dose, Lantus 35 units BID. Fasting was 90 this am after hypoglycemia this am. Will need to follow trends. May also have to decrease correction scale.  Thanks,  Christena Deem RN, MSN, [facility] Inpatient Diabetes Coordinator Team Pager [phone]

## 2015-02-13 NOTE — Progress Notes (Signed)
Hypoglycemic Event  CBG: 31  Treatment: 6 (4oz) cups of grape juice  Symptoms: None  Follow-up CBG: Time:0817 CBG Result: 90  Possible Reasons for Event: Inadequate meal intake  Comments/MD notified: MD Gwenlyn Perking notified    Leane Platt

## 2015-02-13 NOTE — Progress Notes (Signed)
SATURATION QUALIFICATIONS: (This note is used to comply with regulatory documentation for home oxygen)  Patient Saturations on Room Air at Rest = 83%   

## 2015-02-13 NOTE — Progress Notes (Signed)
Utilization Review completed. Marielle Mantione RN BSN CM 

## 2015-02-14 ENCOUNTER — Telehealth: Payer: Self-pay | Admitting: Pulmonary Disease

## 2015-02-14 NOTE — Telephone Encounter (Signed)
[name] cb, states she will be seeing patient on 6/30 and wants to speak with BQ, requesting that BQ call her when he is back in the office, did not want to speak to nurse, would rather [place] just have BQ to call her  Before she see's the patient on 6/30.

## 2015-02-14 NOTE — Telephone Encounter (Signed)
To BQ, Dr. Zenovia Jordan would like for you to call her back when you get back in the office.   She can be reached at [phone]

## 2015-02-14 NOTE — Telephone Encounter (Signed)
Called [name] back and advised her that [name] is not in office today and asked her if she needed to speak with one of our doctors that are here.   Advised her to call back.

## 2015-02-17 ENCOUNTER — Telehealth: Payer: Self-pay | Admitting: Pulmonary Disease

## 2015-02-17 NOTE — Telephone Encounter (Signed)
Sure, tomorrow at [employer]3:30

## 2015-02-17 NOTE — Telephone Encounter (Signed)
Pt scheduled at below time.  Pt and wife aware.  Nothing further needed.

## 2015-02-17 NOTE — Telephone Encounter (Signed)
Discussed with her today. We are both going to see him this week.

## 2015-02-17 NOTE — Telephone Encounter (Signed)
Patient is having trouble with his heart rate of 122 with exertion.  Patient says that someone messed up his schedule, he was supposed to be scheduled to see [name] tomorrow and not July 28th. He says that he is very upset because he is suffering and needs to be seen ASAP.   [name], is there a place we can add him to your schedule this week?

## 2015-02-18 ENCOUNTER — Encounter (HOSPITAL_COMMUNITY): Payer: Self-pay | Admitting: Neurology

## 2015-02-18 ENCOUNTER — Inpatient Hospital Stay (HOSPITAL_COMMUNITY)
Admission: EM | Admit: 2015-02-18 | Discharge: 2015-03-01 | DRG: 545 | Payer: 59 | Attending: Pulmonary Disease | Admitting: Pulmonary Disease

## 2015-02-18 ENCOUNTER — Ambulatory Visit: Payer: 59 | Admitting: Pulmonary Disease

## 2015-02-18 ENCOUNTER — Inpatient Hospital Stay (HOSPITAL_COMMUNITY): Payer: 59

## 2015-02-18 ENCOUNTER — Emergency Department (HOSPITAL_COMMUNITY): Payer: 59

## 2015-02-18 DIAGNOSIS — J939 Pneumothorax, unspecified: Secondary | ICD-10-CM

## 2015-02-18 DIAGNOSIS — J9588 Other intraoperative complications of respiratory system, not elsewhere classified: Secondary | ICD-10-CM | POA: Diagnosis not present

## 2015-02-18 DIAGNOSIS — T41295A Adverse effect of other general anesthetics, initial encounter: Secondary | ICD-10-CM | POA: Diagnosis not present

## 2015-02-18 DIAGNOSIS — Z22322 Carrier or suspected carrier of Methicillin resistant Staphylococcus aureus: Secondary | ICD-10-CM | POA: Diagnosis not present

## 2015-02-18 DIAGNOSIS — Z794 Long term (current) use of insulin: Secondary | ICD-10-CM

## 2015-02-18 DIAGNOSIS — J9601 Acute respiratory failure with hypoxia: Secondary | ICD-10-CM | POA: Diagnosis not present

## 2015-02-18 DIAGNOSIS — E875 Hyperkalemia: Secondary | ICD-10-CM | POA: Diagnosis not present

## 2015-02-18 DIAGNOSIS — Y95 Nosocomial condition: Secondary | ICD-10-CM | POA: Diagnosis present

## 2015-02-18 DIAGNOSIS — Z4659 Encounter for fitting and adjustment of other gastrointestinal appliance and device: Secondary | ICD-10-CM

## 2015-02-18 DIAGNOSIS — G9340 Encephalopathy, unspecified: Secondary | ICD-10-CM | POA: Diagnosis present

## 2015-02-18 DIAGNOSIS — E1165 Type 2 diabetes mellitus with hyperglycemia: Secondary | ICD-10-CM | POA: Diagnosis present

## 2015-02-18 DIAGNOSIS — D638 Anemia in other chronic diseases classified elsewhere: Secondary | ICD-10-CM | POA: Diagnosis present

## 2015-02-18 DIAGNOSIS — Z9289 Personal history of other medical treatment: Secondary | ICD-10-CM

## 2015-02-18 DIAGNOSIS — E874 Mixed disorder of acid-base balance: Secondary | ICD-10-CM | POA: Diagnosis present

## 2015-02-18 DIAGNOSIS — Z87891 Personal history of nicotine dependence: Secondary | ICD-10-CM | POA: Diagnosis not present

## 2015-02-18 DIAGNOSIS — Z4682 Encounter for fitting and adjustment of non-vascular catheter: Secondary | ICD-10-CM

## 2015-02-18 DIAGNOSIS — I959 Hypotension, unspecified: Secondary | ICD-10-CM | POA: Diagnosis present

## 2015-02-18 DIAGNOSIS — K219 Gastro-esophageal reflux disease without esophagitis: Secondary | ICD-10-CM | POA: Diagnosis present

## 2015-02-18 DIAGNOSIS — Z01818 Encounter for other preprocedural examination: Secondary | ICD-10-CM

## 2015-02-18 DIAGNOSIS — Z7982 Long term (current) use of aspirin: Secondary | ICD-10-CM | POA: Diagnosis not present

## 2015-02-18 DIAGNOSIS — E870 Hyperosmolality and hypernatremia: Secondary | ICD-10-CM | POA: Diagnosis present

## 2015-02-18 DIAGNOSIS — N179 Acute kidney failure, unspecified: Secondary | ICD-10-CM | POA: Diagnosis present

## 2015-02-18 DIAGNOSIS — J982 Interstitial emphysema: Secondary | ICD-10-CM | POA: Diagnosis not present

## 2015-02-18 DIAGNOSIS — Z978 Presence of other specified devices: Secondary | ICD-10-CM

## 2015-02-18 DIAGNOSIS — Y838 Other surgical procedures as the cause of abnormal reaction of the patient, or of later complication, without mention of misadventure at the time of the procedure: Secondary | ICD-10-CM | POA: Diagnosis not present

## 2015-02-18 DIAGNOSIS — Z881 Allergy status to other antibiotic agents status: Secondary | ICD-10-CM

## 2015-02-18 DIAGNOSIS — R918 Other nonspecific abnormal finding of lung field: Secondary | ICD-10-CM | POA: Diagnosis present

## 2015-02-18 DIAGNOSIS — Z79899 Other long term (current) drug therapy: Secondary | ICD-10-CM

## 2015-02-18 DIAGNOSIS — R0682 Tachypnea, not elsewhere classified: Secondary | ICD-10-CM

## 2015-02-18 DIAGNOSIS — D573 Sickle-cell trait: Secondary | ICD-10-CM | POA: Diagnosis present

## 2015-02-18 DIAGNOSIS — M3390 Dermatopolymyositis, unspecified, organ involvement unspecified: Secondary | ICD-10-CM | POA: Diagnosis not present

## 2015-02-18 DIAGNOSIS — Z79891 Long term (current) use of opiate analgesic: Secondary | ICD-10-CM | POA: Diagnosis not present

## 2015-02-18 DIAGNOSIS — Z452 Encounter for adjustment and management of vascular access device: Secondary | ICD-10-CM | POA: Insufficient documentation

## 2015-02-18 DIAGNOSIS — J9600 Acute respiratory failure, unspecified whether with hypoxia or hypercapnia: Secondary | ICD-10-CM | POA: Diagnosis not present

## 2015-02-18 DIAGNOSIS — A419 Sepsis, unspecified organism: Secondary | ICD-10-CM

## 2015-02-18 DIAGNOSIS — Z7952 Long term (current) use of systemic steroids: Secondary | ICD-10-CM

## 2015-02-18 DIAGNOSIS — T8579XA Infection and inflammatory reaction due to other internal prosthetic devices, implants and grafts, initial encounter: Secondary | ICD-10-CM

## 2015-02-18 DIAGNOSIS — J80 Acute respiratory distress syndrome: Secondary | ICD-10-CM

## 2015-02-18 DIAGNOSIS — F22 Delusional disorders: Secondary | ICD-10-CM

## 2015-02-18 DIAGNOSIS — J9690 Respiratory failure, unspecified, unspecified whether with hypoxia or hypercapnia: Secondary | ICD-10-CM

## 2015-02-18 DIAGNOSIS — M3391 Dermatopolymyositis, unspecified with respiratory involvement: Principal | ICD-10-CM | POA: Diagnosis present

## 2015-02-18 DIAGNOSIS — M109 Gout, unspecified: Secondary | ICD-10-CM | POA: Diagnosis present

## 2015-02-18 DIAGNOSIS — T8182XA Emphysema (subcutaneous) resulting from a procedure, initial encounter: Secondary | ICD-10-CM | POA: Diagnosis not present

## 2015-02-18 DIAGNOSIS — I9589 Other hypotension: Secondary | ICD-10-CM | POA: Diagnosis not present

## 2015-02-18 DIAGNOSIS — Z880 Allergy status to penicillin: Secondary | ICD-10-CM | POA: Diagnosis not present

## 2015-02-18 DIAGNOSIS — J189 Pneumonia, unspecified organism: Secondary | ICD-10-CM

## 2015-02-18 DIAGNOSIS — I10 Essential (primary) hypertension: Secondary | ICD-10-CM | POA: Diagnosis present

## 2015-02-18 DIAGNOSIS — I952 Hypotension due to drugs: Secondary | ICD-10-CM | POA: Diagnosis not present

## 2015-02-18 DIAGNOSIS — R739 Hyperglycemia, unspecified: Secondary | ICD-10-CM | POA: Insufficient documentation

## 2015-02-18 DIAGNOSIS — Z9911 Dependence on respirator [ventilator] status: Secondary | ICD-10-CM

## 2015-02-18 DIAGNOSIS — Z9689 Presence of other specified functional implants: Secondary | ICD-10-CM

## 2015-02-18 LAB — URINALYSIS, ROUTINE W REFLEX MICROSCOPIC
Bilirubin Urine: NEGATIVE
Bilirubin Urine: NEGATIVE
GLUCOSE, UA: NEGATIVE mg/dL
Glucose, UA: NEGATIVE mg/dL
Ketones, ur: NEGATIVE mg/dL
Ketones, ur: NEGATIVE mg/dL
Leukocytes, UA: NEGATIVE
Leukocytes, UA: NEGATIVE
NITRITE: NEGATIVE
Nitrite: NEGATIVE
PROTEIN: 30 mg/dL — AB
Protein, ur: 30 mg/dL — AB
Specific Gravity, Urine: 1.015 (ref 1.005–1.030)
Specific Gravity, Urine: 1.016 (ref 1.005–1.030)
Urobilinogen, UA: 1 mg/dL (ref 0.0–1.0)
Urobilinogen, UA: 0.2 mg/dL (ref 0.0–1.0)
pH: 6 (ref 5.0–8.0)
pH: 5.5 (ref 5.0–8.0)

## 2015-02-18 LAB — I-STAT ARTERIAL BLOOD GAS, ED
Acid-Base Excess: 1 mmol/L (ref 0.0–2.0)
Acid-base deficit: 2 mmol/L (ref 0.0–2.0)
BICARBONATE: 24.2 meq/L — AB (ref 20.0–24.0)
BICARBONATE: 27 meq/L — AB (ref 20.0–24.0)
O2 Saturation: 99 %
O2 Saturation: 93 %
PCO2 ART: 32.6 mmHg — AB (ref 35.0–45.0)
PH ART: 7.479 — AB (ref 7.350–7.450)
PH ART: 7.208 — AB (ref 7.350–7.450)
PO2 ART: 110 mmHg — AB (ref 80.0–100.0)
Patient temperature: 98.6
TCO2: 25 mmol/L (ref 0–100)
TCO2: 29 mmol/L (ref 0–100)
pCO2 arterial: 67.7 mmHg (ref 35.0–45.0)
pO2, Arterial: 84 mmHg (ref 80.0–100.0)

## 2015-02-18 LAB — CBC WITH DIFFERENTIAL/PLATELET
BASOS ABS: 0 10*3/uL (ref 0.0–0.1)
Basophils Relative: 0 % (ref 0–1)
EOS PCT: 0 % (ref 0–5)
Eosinophils Absolute: 0 10*3/uL (ref 0.0–0.7)
HEMATOCRIT: 35.2 % — AB (ref 39.0–52.0)
HEMOGLOBIN: 10.9 g/dL — AB (ref 13.0–17.0)
Lymphocytes Relative: 3 % — ABNORMAL LOW (ref 12–46)
Lymphs Abs: 0.7 10*3/uL (ref 0.7–4.0)
MCH: 26.7 pg (ref 26.0–34.0)
MCHC: 31 g/dL (ref 30.0–36.0)
MCV: 86.1 fL (ref 78.0–100.0)
MONOS PCT: 6 % (ref 3–12)
Monocytes Absolute: 1.4 10*3/uL — ABNORMAL HIGH (ref 0.1–1.0)
Neutro Abs: 20.5 10*3/uL — ABNORMAL HIGH (ref 1.7–7.7)
Neutrophils Relative %: 91 % — ABNORMAL HIGH (ref 43–77)
Platelets: 320 10*3/uL (ref 150–400)
RBC: 4.09 MIL/uL — ABNORMAL LOW (ref 4.22–5.81)
RDW: 21.3 % — ABNORMAL HIGH (ref 11.5–15.5)
WBC: 22.6 10*3/uL — ABNORMAL HIGH (ref 4.0–10.5)

## 2015-02-18 LAB — BASIC METABOLIC PANEL
Anion gap: 13 (ref 5–15)
BUN: 26 mg/dL — ABNORMAL HIGH (ref 6–20)
CO2: 21 mmol/L — ABNORMAL LOW (ref 22–32)
Calcium: 7.9 mg/dL — ABNORMAL LOW (ref 8.9–10.3)
Chloride: 108 mmol/L (ref 101–111)
Creatinine, Ser: 1.74 mg/dL — ABNORMAL HIGH (ref 0.61–1.24)
GFR calc Af Amer: 47 mL/min — ABNORMAL LOW (ref >60)
GFR calc non Af Amer: 41 mL/min — ABNORMAL LOW (ref >60)
Glucose, Bld: 171 mg/dL — ABNORMAL HIGH (ref 65–99)
POTASSIUM: 4.8 mmol/L (ref 3.5–5.1)
SODIUM: 142 mmol/L (ref 135–145)

## 2015-02-18 LAB — URINE MICROSCOPIC-ADD ON

## 2015-02-18 LAB — GLUCOSE, CAPILLARY
GLUCOSE-CAPILLARY: 127 mg/dL — AB (ref 65–99)
Glucose-Capillary: 138 mg/dL — ABNORMAL HIGH (ref 65–99)

## 2015-02-18 LAB — I-STAT CG4 LACTIC ACID, ED: Lactic Acid, Venous: 4.03 mmol/L (ref 0.5–2.0)

## 2015-02-18 LAB — I-STAT TROPONIN, ED: Troponin i, poc: 0.17 ng/mL (ref 0.00–0.08)

## 2015-02-18 LAB — SODIUM, URINE, RANDOM: Sodium, Ur: 43 mmol/L

## 2015-02-18 LAB — BRAIN NATRIURETIC PEPTIDE: B Natriuretic Peptide: 28 pg/mL (ref 0.0–100.0)

## 2015-02-18 LAB — CREATININE, URINE, RANDOM: Creatinine, Urine: 111.66 mg/dL

## 2015-02-18 MED ORDER — FAMOTIDINE IN NACL 20-0.9 MG/50ML-% IV SOLN
20.0000 mg | Freq: Two times a day (BID) | INTRAVENOUS | Status: DC
  Administered 2015-02-18 – 2015-02-25 (×15): 20 mg via INTRAVENOUS
  Filled 2015-02-18 (×17): qty 50

## 2015-02-18 MED ORDER — METHYLPREDNISOLONE SODIUM SUCC 40 MG IJ SOLR
40.0000 mg | Freq: Two times a day (BID) | INTRAMUSCULAR | Status: DC
  Administered 2015-02-18 – 2015-02-22 (×8): 40 mg via INTRAVENOUS
  Filled 2015-02-18 (×9): qty 1

## 2015-02-18 MED ORDER — FENTANYL BOLUS VIA INFUSION
50.0000 ug | INTRAVENOUS | Status: DC | PRN
  Administered 2015-02-19 – 2015-02-21 (×6): 50 ug via INTRAVENOUS
  Filled 2015-02-18: qty 50

## 2015-02-18 MED ORDER — PROPOFOL 1000 MG/100ML IV EMUL
0.0000 ug/kg/min | INTRAVENOUS | Status: DC
  Administered 2015-02-18: 5 ug/kg/min via INTRAVENOUS
  Administered 2015-02-19 (×2): 30 ug/kg/min via INTRAVENOUS
  Administered 2015-02-19 (×2): 40 ug/kg/min via INTRAVENOUS
  Administered 2015-02-19 (×2): 30 ug/kg/min via INTRAVENOUS
  Administered 2015-02-20 (×3): 40 ug/kg/min via INTRAVENOUS
  Administered 2015-02-20: 45 ug/kg/min via INTRAVENOUS
  Filled 2015-02-18 (×11): qty 100

## 2015-02-18 MED ORDER — DEXTROSE 5 % IV SOLN
1.0000 g | Freq: Three times a day (TID) | INTRAVENOUS | Status: DC
  Administered 2015-02-18 – 2015-02-23 (×15): 1 g via INTRAVENOUS
  Filled 2015-02-18 (×17): qty 1

## 2015-02-18 MED ORDER — MIDAZOLAM HCL 2 MG/2ML IJ SOLN
2.0000 mg | Freq: Once | INTRAMUSCULAR | Status: AC
  Administered 2015-02-18: 2 mg via INTRAVENOUS

## 2015-02-18 MED ORDER — FENTANYL CITRATE (PF) 100 MCG/2ML IJ SOLN
100.0000 ug | Freq: Once | INTRAMUSCULAR | Status: AC
Start: 2015-02-18 — End: 2015-02-18
  Administered 2015-02-18: 100 ug via INTRAVENOUS

## 2015-02-18 MED ORDER — ROCURONIUM BROMIDE 50 MG/5ML IV SOLN
INTRAVENOUS | Status: DC | PRN
  Administered 2015-02-18: 80 mg via INTRAVENOUS

## 2015-02-18 MED ORDER — SODIUM CHLORIDE 0.9 % IV SOLN
INTRAVENOUS | Status: DC
  Administered 2015-02-18: 21:00:00 via INTRAVENOUS
  Administered 2015-02-19: 75 mL/h via INTRAVENOUS
  Administered 2015-02-20: 04:00:00 via INTRAVENOUS

## 2015-02-18 MED ORDER — SUCCINYLCHOLINE CHLORIDE 20 MG/ML IJ SOLN
INTRAMUSCULAR | Status: AC
  Filled 2015-02-18: qty 1

## 2015-02-18 MED ORDER — ETOMIDATE 2 MG/ML IV SOLN
INTRAVENOUS | Status: DC | PRN
  Administered 2015-02-18: 20 mg via INTRAVENOUS

## 2015-02-18 MED ORDER — MIDAZOLAM HCL 2 MG/2ML IJ SOLN
INTRAMUSCULAR | Status: AC
  Administered 2015-02-20: 2 mg via INTRAVENOUS
  Filled 2015-02-18: qty 2

## 2015-02-18 MED ORDER — LIDOCAINE HCL (CARDIAC) 20 MG/ML IV SOLN
INTRAVENOUS | Status: AC
  Filled 2015-02-18: qty 5

## 2015-02-18 MED ORDER — MIDAZOLAM HCL 2 MG/2ML IJ SOLN
2.0000 mg | INTRAMUSCULAR | Status: DC | PRN
  Administered 2015-02-18: 2 mg via INTRAVENOUS
  Filled 2015-02-18 (×3): qty 2

## 2015-02-18 MED ORDER — FENTANYL CITRATE (PF) 100 MCG/2ML IJ SOLN: 100.0000 ug | INTRAMUSCULAR | Status: DC | PRN

## 2015-02-18 MED ORDER — SODIUM CHLORIDE 0.9 % IV BOLUS (SEPSIS)
1000.0000 mL | Freq: Once | INTRAVENOUS | Status: AC
  Administered 2015-02-18: 1000 mL via INTRAVENOUS

## 2015-02-18 MED ORDER — FENTANYL CITRATE (PF) 100 MCG/2ML IJ SOLN
INTRAMUSCULAR | Status: AC
  Filled 2015-02-18: qty 2

## 2015-02-18 MED ORDER — FENTANYL CITRATE (PF) 100 MCG/2ML IJ SOLN
100.0000 ug | Freq: Once | INTRAMUSCULAR | Status: AC
  Administered 2015-02-18: 100 ug via INTRAVENOUS

## 2015-02-18 MED ORDER — METOPROLOL TARTRATE 1 MG/ML IV SOLN: 2.5000 mg | INTRAVENOUS | Status: DC | PRN

## 2015-02-18 MED ORDER — ACETAMINOPHEN 325 MG PO TABS: 650.0000 mg | ORAL_TABLET | ORAL | Status: DC | PRN

## 2015-02-18 MED ORDER — MIDAZOLAM HCL 2 MG/2ML IJ SOLN
INTRAMUSCULAR | Status: AC
  Filled 2015-02-18: qty 2

## 2015-02-18 MED ORDER — LEVALBUTEROL HCL 0.63 MG/3ML IN NEBU
0.6300 mg | INHALATION_SOLUTION | RESPIRATORY_TRACT | Status: DC | PRN
  Administered 2015-02-20: 0.63 mg via RESPIRATORY_TRACT
  Filled 2015-02-18 (×2): qty 3

## 2015-02-18 MED ORDER — FENTANYL CITRATE (PF) 100 MCG/2ML IJ SOLN
100.0000 ug | INTRAMUSCULAR | Status: DC | PRN
  Filled 2015-02-18: qty 2

## 2015-02-18 MED ORDER — HEPARIN SODIUM (PORCINE) 5000 UNIT/ML IJ SOLN
5000.0000 [IU] | Freq: Three times a day (TID) | INTRAMUSCULAR | Status: DC
  Administered 2015-02-18 – 2015-03-01 (×33): 5000 [IU] via SUBCUTANEOUS
  Filled 2015-02-18 (×35): qty 1

## 2015-02-18 MED ORDER — FENTANYL CITRATE (PF) 100 MCG/2ML IJ SOLN
50.0000 ug | Freq: Once | INTRAMUSCULAR | Status: AC
  Administered 2015-02-18: 50 ug via INTRAVENOUS

## 2015-02-18 MED ORDER — ACETAMINOPHEN 650 MG RE SUPP
650.0000 mg | Freq: Once | RECTAL | Status: AC
  Administered 2015-02-18: 650 mg via RECTAL
  Filled 2015-02-18: qty 1

## 2015-02-18 MED ORDER — VANCOMYCIN HCL 10 G IV SOLR
2000.0000 mg | Freq: Once | INTRAVENOUS | Status: AC
  Administered 2015-02-18: 2000 mg via INTRAVENOUS
  Filled 2015-02-18: qty 2000

## 2015-02-18 MED ORDER — INSULIN ASPART 100 UNIT/ML ~~LOC~~ SOLN
0.0000 [IU] | SUBCUTANEOUS | Status: DC
  Administered 2015-02-18 (×2): 2 [IU] via SUBCUTANEOUS
  Administered 2015-02-19: 3 [IU] via SUBCUTANEOUS
  Administered 2015-02-19 (×2): 2 [IU] via SUBCUTANEOUS
  Administered 2015-02-19: 5 [IU] via SUBCUTANEOUS
  Administered 2015-02-19: 2 [IU] via SUBCUTANEOUS
  Administered 2015-02-20: 8 [IU] via SUBCUTANEOUS
  Administered 2015-02-20: 11 [IU] via SUBCUTANEOUS
  Administered 2015-02-20 (×2): 5 [IU] via SUBCUTANEOUS
  Administered 2015-02-20: 15 [IU] via SUBCUTANEOUS
  Administered 2015-02-20: 8 [IU] via SUBCUTANEOUS
  Administered 2015-02-21: 2 [IU] via SUBCUTANEOUS
  Administered 2015-02-21: 3 [IU] via SUBCUTANEOUS
  Administered 2015-02-21 (×2): 2 [IU] via SUBCUTANEOUS
  Administered 2015-02-22: 15 [IU] via SUBCUTANEOUS
  Administered 2015-02-22: 3 [IU] via SUBCUTANEOUS
  Administered 2015-02-22: 8 [IU] via SUBCUTANEOUS
  Administered 2015-02-22: 3 [IU] via SUBCUTANEOUS

## 2015-02-18 MED ORDER — ROCURONIUM BROMIDE 50 MG/5ML IV SOLN
INTRAVENOUS | Status: AC
  Filled 2015-02-18: qty 2

## 2015-02-18 MED ORDER — SENNOSIDES 8.8 MG/5ML PO SYRP
5.0000 mL | ORAL_SOLUTION | Freq: Two times a day (BID) | ORAL | Status: DC | PRN
  Filled 2015-02-18: qty 5

## 2015-02-18 MED ORDER — MIDAZOLAM HCL 2 MG/2ML IJ SOLN
2.0000 mg | INTRAMUSCULAR | Status: DC | PRN
  Administered 2015-02-18 – 2015-02-20 (×3): 2 mg via INTRAVENOUS
  Filled 2015-02-18: qty 2

## 2015-02-18 MED ORDER — SODIUM CHLORIDE 0.9 % IV BOLUS (SEPSIS)
500.0000 mL | Freq: Once | INTRAVENOUS | Status: AC
  Administered 2015-02-18: 500 mL via INTRAVENOUS

## 2015-02-18 MED ORDER — VANCOMYCIN HCL IN DEXTROSE 750-5 MG/150ML-% IV SOLN
750.0000 mg | Freq: Two times a day (BID) | INTRAVENOUS | Status: DC
  Administered 2015-02-19 – 2015-02-23 (×9): 750 mg via INTRAVENOUS
  Filled 2015-02-18 (×10): qty 150

## 2015-02-18 MED ORDER — SODIUM CHLORIDE 0.9 % IV SOLN
25.0000 ug/h | INTRAVENOUS | Status: DC
  Administered 2015-02-18: 25 ug/h via INTRAVENOUS
  Administered 2015-02-19: 150 ug/h via INTRAVENOUS
  Administered 2015-02-20: 300 ug/h via INTRAVENOUS
  Filled 2015-02-18 (×3): qty 50

## 2015-02-18 MED ORDER — SODIUM CHLORIDE 0.9 % IV SOLN: 250.0000 mL | INTRAVENOUS | Status: DC | PRN

## 2015-02-18 MED ORDER — ETOMIDATE 2 MG/ML IV SOLN
INTRAVENOUS | Status: AC
  Filled 2015-02-18: qty 20

## 2015-02-18 MED ORDER — ALLOPURINOL 300 MG PO TABS
300.0000 mg | ORAL_TABLET | Freq: Every day | ORAL | Status: DC
Start: 2015-02-18 — End: 2015-02-25
  Administered 2015-02-18 – 2015-02-25 (×8): 300 mg via ORAL
  Filled 2015-02-18 (×8): qty 1

## 2015-02-18 MED ORDER — FOLIC ACID 1 MG PO TABS
1.0000 mg | ORAL_TABLET | Freq: Every day | ORAL | Status: DC
Start: 2015-02-19 — End: 2015-02-25
  Administered 2015-02-19 – 2015-02-25 (×7): 1 mg via ORAL
  Filled 2015-02-18 (×7): qty 1

## 2015-02-18 NOTE — Op Note (Signed)
PCCM Bronchoscopy Procedure Note  The patient was informed of the risks (including but not limited to bleeding, infection, respiratory failure, lung injury, tooth/oral injury) and benefits of the procedure and gave consent, see chart.  Indication: Acute respiratory failure with hypoxemia, HCAP, dermatomyositis  Post Procedure diagnosis: Same  [facility]  Condition pre procedure: Critically ill on vent  Medications for procedure: Versed push, Propofol gtt  Procedure description: The bronchoscope was introduced through the endotracheal tube and passed to the bilateral lungs to the level of the subsegmental bronchi throughout the tracheobronchial tree.  Airway exam revealed thick grey secretions throughout the tracheobronchial tree bilaterally. No airway lesion.  ETT in good position.  Procedures performed: BAL RML > 60cc injected, 30cc cloudy return  Specimens sent: BAL> PCP, bacterial, afb, fungal culture  Condition post procedure: Critically ill, on vent  EBL: none  Complications: none  Heber CarolinaBrent McQuaid, MD [place] PCCM Pager: [phone] Cell: [phone] After 3pm or if no response, call [phone]

## 2015-02-18 NOTE — H&P (Signed)
PULMONARY / CRITICAL CARE MEDICINE   Name: Derek Anthony MRN: [id number] DOB: 1955-08-28    ADMISSION DATE:  02/18/2015 CONSULTATION DATE:  02/18/2015  REFERRING MD :  EDP  CHIEF COMPLAINT:  Dyspnea, respiratory distress  INITIAL PRESENTATION:  59 y.o. M with recent dx dermatomyositis, brought to [facility] ED 6/28 for worsening dyspnea and weakness.  Intubated in ED by PCCM and admitted to ICU.   STUDIES:  FOB 6/28 >>> BAL culture - neg, PCP - neg, AFB - neg, Cell Ct - 463 WBC, 65% mono, 27% pmn, 8% lymph, 0% eos, Cytology - neg for CA , Flow Cytometry - non diagnostic, BAL Fungal Culture - neg CXR 6/28 >>> worsening aeration with bilateral opacities.  Concerning for multifocal PNA vs edema.  SIGNIFICANT EVENTS: 6/14 through 6/23 - admitted. 6/28 - re-admitted with worsening hypoxic respiratory failure.    HISTORY OF PRESENT ILLNESS:  Pt is encephalopathic; therefore, this HPI is obtained from chart review. Derek Anthony is a 59 y.o. M with PMH as outlined below including recent diagnosis of dermatomyositis (diagnosed by [name] with rheumatology).  He is s/p 2 cycles of Methotrexate last being 6/13 and has since been on prednisone.He had recent admission for dyspnea and was treated with pulse dose steroids as well as empiric abx for CAP. He was discharged 02/13/15 with follow up scheduled with both pulmonology 6/28 ([name]) and rheumatology 6/30 ([name]).  On afternoon of 6/28, pt was profoundly more dyspneic and could not ambulate more than a few feet without having to stop and rest.  Per wife, he actually fell twice due to weakness and imbalance.  He has apparently felt bad since being discharged from the hospital and has progressively gotten worse.  He has had persistent cough productive of yellow sputum as well as generalized weakness and dyspnea with occasional wheezing.  He denies any fevers/chills/sweats, chest pain, hemoptysis, N/V/D, abdominal pain, myalgias, LE swelling /  pain.  Denies any recent travel.  Was immobilized for almost 48 hours this past weekend due to weakness and he basically slept all weekend long  On ED arrival, he was hypoxic with sats in the 50's on RA.  He was also tachypneic in the 40's with sinus tach, HR of 120's.  He was placed on BiPAP which pt subjectively felt helped his symptoms.  PCCM was called and asked to evaluate for admission.    PAST MEDICAL HISTORY :   has a past medical history of Diabetes mellitus without complication; Hypertension; GERD (gastroesophageal reflux disease); Anemia; Sickle cell trait; Gout; Wears glasses; Muscle pain; and Dermatomyositis.  has past surgical history that includes Shoulder surgery; Knee reconstruction (Right); Vasectomy; Muscle biopsy (Left, 12/23/2014); and Video bronchoscopy (Bilateral, 02/06/2015). Prior to Admission medications   Medication Sig Start Date End Date Taking? Authorizing Provider  allopurinol (ZYLOPRIM) 300 MG tablet Take 1 tablet (300 mg total) by mouth daily. 02/13/15  Yes Vassie Loll, MD  aspirin EC 81 MG tablet Take 81 mg by mouth daily.   Yes Historical Provider, MD  cetirizine (ZYRTEC) 10 MG tablet Take 10 mg by mouth every evening.    Yes Historical Provider, MD  fluticasone (FLONASE) 50 MCG/ACT nasal spray Place 2 sprays into both nostrils daily. 02/13/15  Yes Vassie Loll, MD  fluticasone (FLOVENT HFA) 44 MCG/ACT inhaler Inhale 1 puff into the lungs 2 (two) times daily. 02/13/15  Yes Vassie Loll, MD  folic acid (FOLVITE) 1 MG tablet Take 1 mg by mouth daily.  Yes Historical Provider, MD  gabapentin (NEURONTIN) 600 MG tablet Take 1-2 tablets (600-1,200 mg total) by mouth 2 (two) times daily. TAKES 1200MG  IN AM AND 600MG  IN PM 02/13/15  Yes Vassie Loll, MD  HYDROcodone-acetaminophen (NORCO) 5-325 MG per tablet Take 1-2 tablets by mouth every 6 (six) hours as needed for moderate pain or severe pain. 12/23/14  Yes Claud Kelp, MD  insulin aspart protamine- aspart (NOVOLOG  MIX 70/30) (70-30) 100 UNIT/ML injection Inject 40 Units into the skin 2 (two) times daily.    Yes Historical Provider, MD  insulin glargine (LANTUS) 100 UNIT/ML injection Inject 0.4 mLs (40 Units total) into the skin at bedtime. Patient taking differently: Inject 35 Units into the skin 2 (two) times daily.  11/05/14  Yes Christiane Ha, MD  insulin glargine (LANTUS) 100 UNIT/ML injection Inject 0.42 mLs (42 Units total) into the skin 2 (two) times daily. 02/13/15  Yes Vassie Loll, MD  ketotifen (ZADITOR) 0.025 % ophthalmic solution Place 1 drop into both eyes daily as needed.   Yes Historical Provider, MD  meloxicam (MOBIC) 7.5 MG tablet Take 1 tablet (7.5 mg total) by mouth daily. 11/05/14  Yes Christiane Ha, MD  montelukast (SINGULAIR) 10 MG tablet Take 1 tablet (10 mg total) by mouth at bedtime. 02/13/15  Yes Vassie Loll, MD  omeprazole (PRILOSEC) 40 MG capsule Take 1 capsule (40 mg total) by mouth daily. 02/13/15  Yes Vassie Loll, MD  predniSONE (DELTASONE) 20 MG tablet Take 3 tablets (60 mg total) by mouth daily with breakfast. 02/13/15  Yes Vassie Loll, MD  traMADol (ULTRAM) 50 MG tablet Take 50 mg by mouth every 6 (six) hours as needed for moderate pain.   Yes Historical Provider, MD  olmesartan (BENICAR) 40 MG tablet Take 1 tablet (40 mg total) by mouth daily. 02/13/15   Vassie Loll, MD   Allergies  Allergen Reactions  . Sulfa Antibiotics Itching and Rash    Burning also  . Penicillins Itching and Rash    FAMILY HISTORY:  Family History  Problem Relation Age of Onset  . Hypertension Mother   . Diabetes Mother   . Hypertension Father     SOCIAL HISTORY:  reports that he quit smoking about 22 years ago. His smoking use included Cigarettes. He has a 20 pack-year smoking history. He does not have any smokeless tobacco history on file. He reports that he does not drink alcohol or use illicit drugs.  REVIEW OF SYSTEMS:   All negative; except for those that are bolded,  which indicate positives.  Constitutional: weight loss, weight gain, night sweats, fevers, chills, fatigue, weakness.  HEENT: headaches, sore throat, sneezing, nasal congestion, post nasal drip, difficulty swallowing, tooth/dental problems, visual complaints, visual changes, ear aches. Neuro: difficulty with speech, weakness, numbness, ataxia. CV:  chest pain, orthopnea, PND, swelling in lower extremities, dizziness, palpitations, syncope.  Resp: cough productive of yellow sputum, hemoptysis, dyspnea, wheezing. GI  heartburn, indigestion, abdominal pain, nausea, vomiting, diarrhea, constipation, change in bowel habits, loss of appetite, hematemesis, melena, hematochezia.  GU: dysuria, change in color of urine, urgency or frequency, flank pain, hematuria. MSK: joint pain or swelling, decreased range of motion. Psych: change in mood or affect, depression, anxiety, suicidal ideations, homicidal ideations. Skin: rash, itching, bruising.   SUBJECTIVE:  Feels somewhat better on BiPAP but still breathing 40 - 50 times per minutes.  VITAL SIGNS: Temp:  [102.6 F (39.2 C)] 102.6 F (39.2 C) (06/28 1827) Pulse Rate:  [115-147] 115 (06/28 1632)  Resp:  [26-39] 30 (06/28 1830) BP: (102-143)/(57-91) 119/57 mmHg (06/28 1830) SpO2:  [92 %-100 %] 99 % (06/28 1827) FiO2 (%):  [100 %] 100 % (06/28 1632) HEMODYNAMICS:   VENTILATOR SETTINGS: Vent Mode:  [-]  FiO2 (%):  [100 %] 100 % INTAKE / OUTPUT: Intake/Output    None     PHYSICAL EXAMINATION: General: WDWN male, in mild respiratory distress. Neuro: A&O x 3, non-focal.  HEENT: [place]/AT. PERRL, sclerae anicteric. Cardiovascular: Tachy, regular, no M/R/G.  Lungs: Respirations even and unlabored.  Faint rhonchi bilateral bases. Abdomen: BS x 4, soft, NT/ND.  Musculoskeletal: No gross deformities, no edema.  Skin: Intact, warm.  Gottron papules noted on R hand.  Bilateral mechanics hands noted.  LABS:  CBC  Recent Labs Lab 02/18/15 1615   WBC 22.6*  HGB 10.9*  HCT 35.2*  PLT 320   Coag's No results for input(s): APTT, INR in the last 168 hours. BMET  Recent Labs Lab 02/18/15 1615  NA 142  K 4.8  CL 108  CO2 21*  BUN 26*  CREATININE 1.74*  GLUCOSE 171*   Electrolytes  Recent Labs Lab 02/18/15 1615  CALCIUM 7.9*   Sepsis Markers  Recent Labs Lab 02/18/15 1723  LATICACIDVEN 4.03*   ABG  Recent Labs Lab 02/18/15 1653  PHART 7.479*  PCO2ART 32.6*  PO2ART 110.0*   Liver Enzymes No results for input(s): AST, ALT, ALKPHOS, BILITOT, ALBUMIN in the last 168 hours. Cardiac Enzymes No results for input(s): TROPONINI, PROBNP in the last 168 hours. Glucose  Recent Labs Lab 02/12/15 1152 02/12/15 1650 02/12/15 2149 02/13/15 0743 02/13/15 0817 02/13/15 1137  GLUCAP 277* 337* 242* 31* 90 158*    Imaging Dg Chest Port 1 View  02/18/2015   CLINICAL DATA:  Respiratory distress  EXAM: PORTABLE CHEST - 1 VIEW  COMPARISON:  Six/14/16  FINDINGS: There is worsening in aeration with patchy airspace opacification bilaterally. Findings suspicious for bilateral multifocal asymmetric pneumonia or pulmonary edema. Clinical correlation is necessary.  IMPRESSION: Worsening in aeration. Patchy airspace disease bilateral upper lobes and mild interstitial prominence bilaterally. Findings suspicious for multifocal pneumonia or pulmonary edema. Clinical correlation is necessary   Electronically Signed   By: Natasha Mead M.D.   On: 02/18/2015 16:36    ASSESSMENT / PLAN:  PULMONARY OETT 6/28 >>> A: Acute hypoxic respiratory failure in setting worsening bilateral airspace disease. Probable HCAP given hx and CXR findings + being on chronic prednisone. Baseline dermatomyositis - s/p 2 cycles methotrexate and on daily prednisone. P:   Intubate now. Full mechanical support, wean as able. Bronch for BAL cultures once transported up to ICU. VAP bundle. Hold SBT for next 2 - 3 days to facilitate pulmonary rest /  recovery.' Abx per ID section. Solumedrol, Levalbuterol PRN. CXR in AM. If no improvement over next 2 - 3 days despite abx therapy, then will need to consider VATS with biopsy.  CARDIOVASCULAR A:  Sinus tach. Hx HTN. P:  Lopressor PRN. Hold outpatient olmesartan.  RENAL A:   AKI. ? Hypocalcemia - no albumin to correct for. P:   NS @ 75. Send ionized calcium. BMP in AM.  GASTROINTESTINAL A:   GERD. Nutrition. P:   SUP: Famotidine. NPO.  HEMATOLOGIC A:   Chronic anemia. VTE Prophylaxis. P:  SCD's / Heparin. CBC in AM.  INFECTIOUS A:   Probable HCAP given hx and CXR findings + being on chronic prednisone. P:   BCx2 6/28 > BAL 6/28 > U. Strep 6/28 >  U. Legionella 6/28 > PCP smear 6/28 > Abx: Vanc, start date 6/28, day 1/x. Abx: Ceftaz, start date 6/28, day 1/x.  ENDOCRINE A:   DM. P:   SSI.  NEUROLOGIC A:   Acute encephalopathy due to sedation. P:   Sedation:  Propofol gtt / Fentanyl PRN. RASS goal: 0 to -1. Daily WUA. Hold outpatient gabapentin, norco.  RHEUMATOLOGIC A: Baseline dermatomyositis - s/p 2 cycles methotrexate and on daily prednisone. P: Solumedrol. Will follow up with Dr. Nickola MajorHawkes at outpatient, will likely require immunotherapy (cellcept, etc).   Family updated: Wife and sons at bedside.  Interdisciplinary Family Meeting v Palliative Care Meeting:  Due by: 02/24/15.   Rutherford Guysahul Desai, [place] - [facility] Pager: [phone]  or [phone] 02/18/2015, 7:24 PM  Attending:  I have seen and examined the patient with nurse practitioner/resident and agree with the note above.   I am familiar with Derek Anthony from his hospitalization last week.  He has dermatomyositis and progressive bilateral infiltrates. He was treated with pulse dose steroids and then discharged home last week.  His family notes that he was weak at home for several days, slept a lot and became more dyspneic after about 2-3 days  at home.  All the while he was coughing up grey mucus.  Today he came to the ED, profoundly dyspneic.  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    Plan: Intubate Full vent support with high peep Bronch with bal for: bacterial, fungal, afb culture, respiratory viral panel, PCP DFA HCAP coverage, hold PCP coverage for now Solumedrol 40mg  IV q12 hours  IF NO IMPROVEMENT IN 2-3 days, then would consult thoracic surgery for VATS biopsy to get a handle on underlying diagnosis.  This would change management as if this is NSIP associated with dermatomyositis then he needs treatment with cytoxan.  That condition can respond to cytoxan (though sometimes takes weeks to respond).  Family updated bedside by me  My cc time 90 minutes  Heber CarolinaBrent Kenzlie Disch, MD [place] [facility] Pager: [phone] Cell: [phone] After 3pm or if no response, call [phone]

## 2015-02-18 NOTE — Progress Notes (Signed)
ANTIBIOTIC CONSULT NOTE - INITIAL  Pharmacy Consult for Ceftazidime Indication: rule out pneumonia  Allergies  Allergen Reactions  . Sulfa Antibiotics Itching and Rash    Burning also  . Penicillins Itching and Rash    Patient Measurements:   Adjusted Body Weight:   Vital Signs: BP: 102/72 mmHg (06/28 1645) Pulse Rate: 115 (06/28 1632) Intake/Output from previous day:   Intake/Output from this shift:    Labs:  Recent Labs  02/18/15 1615  WBC 22.6*  HGB 10.9*  PLT 320  CREATININE 1.74*   Estimated Creatinine Clearance: 56.3 mL/min (by C-G formula based on Cr of 1.74). No results for input(s): VANCOTROUGH, VANCOPEAK, VANCORANDOM, GENTTROUGH, GENTPEAK, GENTRANDOM, TOBRATROUGH, TOBRAPEAK, TOBRARND, AMIKACINPEAK, AMIKACINTROU, AMIKACIN in the last 72 hours.   Microbiology: Recent Results (from the past 720 hour(s))  Culture, blood (routine x 2)     Status: None   Collection Time: 02/04/15  9:00 PM  Result Value Ref Range Status   Specimen Description BLOOD RIGHT ARM  Final   Special Requests BOTTLES DRAWN AEROBIC AND ANAEROBIC 5CC EA  Final   Culture   Final    NO GROWTH 5 DAYS Performed at [facility]    Report Status 02/11/2015 FINAL  Final  Culture, blood (routine x 2)     Status: None   Collection Time: 02/04/15  9:15 PM  Result Value Ref Range Status   Specimen Description BLOOD LEFT ARM  Final   Special Requests BOTTLES DRAWN AEROBIC AND ANAEROBIC 5CC EA  Final   Culture   Final    NO GROWTH 5 DAYS Performed at [facility]    Report Status 02/11/2015 FINAL  Final  Culture, sputum-assessment     Status: None   Collection Time: 02/06/15 11:27 AM  Result Value Ref Range Status   Specimen Description SPUTUM  Final   Special Requests NONE  Final   Sputum evaluation   Final    MICROSCOPIC FINDINGS SUGGEST THAT THIS SPECIMEN IS NOT REPRESENTATIVE OF LOWER RESPIRATORY SECRETIONS. PLEASE RECOLLECT. Results Called to: R L'ESPERANCE,RN AT 1205  02/06/15 BY L BENFIELD    Report Status 02/06/2015 FINAL  Final  Culture, respiratory (NON-Expectorated)     Status: None   Collection Time: 02/06/15 12:38 PM  Result Value Ref Range Status   Specimen Description BRONCHIAL ALVEOLAR LAVAGE  Final   Special Requests Immunocompromised  Final   Gram Stain   Final    RARE WBC PRESENT,BOTH PMN AND MONONUCLEAR NO SQUAMOUS EPITHELIAL CELLS SEEN NO ORGANISMS SEEN Performed at [facility]    Culture   Final    Non-Pathogenic Oropharyngeal-type Flora Isolated. Performed at [facility]    Report Status 02/09/2015 FINAL  Final  Fungus Culture with Smear     Status: None (Preliminary result)   Collection Time: 02/06/15 12:38 PM  Result Value Ref Range Status   Specimen Description BRONCHIAL ALVEOLAR LAVAGE  Final   Special Requests Immunocompromised  Final   Fungal Smear   Final    NO YEAST OR FUNGAL ELEMENTS SEEN Performed at [facility]    Culture   Final    CULTURE IN PROGRESS FOR FOUR WEEKS Performed at [facility]    Report Status PENDING  Incomplete  AFB culture with smear     Status: None (Preliminary result)   Collection Time: 02/06/15 12:38 PM  Result Value Ref Range Status   Specimen Description BRONCHIAL ALVEOLAR LAVAGE  Final   Special Requests Immunocompromised  Final   Acid Fast Smear   Final    NO ACID FAST BACILLI SEEN [facility]    Culture   Final    CULTURE WILL BE EXAMINED FOR 6 WEEKS BEFORE ISSUING A FINAL REPORT [facility]formed at [facility]    Report Status PENDING  Incomplete  Pneumocystis smear by DFA     Status: None   Collection Time: 02/06/15 12:38 PM  Result Value Ref Range Status   Specimen Source-[facility] BRONCHIAL ALVEOLAR LAVAGE  Final   Pneumocystis jiroveci Ag NEGATIVE  Final    Comment: [facility]formed [facility]    Medical History: Past Medical History  Diagnosis Date  . Diabetes mellitus without complication   .  Hypertension   . GERD (gastroesophageal reflux disease)   . Anemia   . Sickle cell trait   . Gout   . Wears glasses   . Muscle pain   . Dermatomyositis     Medications:   (Not in a hospital admission) Scheduled:   Infusions:  . vancomycin     Assessment: 59yo male with history of DM2 and HTN presents with respiratory distress. Pharmacy is consulted to dose ceftazidime for suspected HCAP. WBC 22.6, sCr 1.7.  Pt reports rash to PCNs in the past with rash and itching on arms. Is aware of change of cross-sensitivity and wants to try ceftazidime.  Goal of Therapy:  Eradication of infection  Plan:  Ceftazidime 1g IV q8h Expected duration 7 days with resolution of temperature and/or normalization of WBC Follow up culture results, renal function, and clinical course  Arlean HoppingCorey M. Newman PiesBall, PharmD Clinical Pharmacist Pager [phone] 02/18/2015,5:02 PM

## 2015-02-18 NOTE — Progress Notes (Signed)
eLink Physician-Brief Progress Note Patient Name: Derek Anthony DOB: Nov 07, 1955 MRN: [id number]   Date of Service  02/18/2015  HPI/Events of Note  Hypotension - likely related to Propofol IV infusion.   eICU Interventions  Will bolus with 0.9 NaCl 500 mL IV over 1 hour now.      Intervention Category Intermediate Interventions: Hypotension - evaluation and management  Sorina Derrig Eugene 02/18/2015, 11:24 PM

## 2015-02-18 NOTE — Progress Notes (Signed)
Transported from ED to 2M12 without complications

## 2015-02-18 NOTE — ED Notes (Signed)
Pt placed on bipap, is 100%. Family arrived to bedside.

## 2015-02-18 NOTE — ED Notes (Signed)
Per EMS- Pt comes from home c/o sob, generalized weakness; initial oxygen 50% RA, came up to 80% on NRB, HR 140, BP 128 palpated, lung sounds clear. Respiratory distress noted, RR 40.

## 2015-02-18 NOTE — Progress Notes (Signed)
ANTIBIOTIC CONSULT NOTE - INITIAL  Pharmacy Consult for vancomycin  Indication: pneumonia  Allergies  Allergen Reactions  . Sulfa Antibiotics Itching and Rash    Burning also  . Penicillins Itching and Rash    Patient Measurements: Weight: 216 lb 7.9 oz (98.2 kg) Adjusted Body Weight:   Vital Signs: Temp: 101.1 F (38.4 C) (06/28 2041) Temp Source: Oral (06/28 2041) BP: 167/87 mmHg (06/28 2020) Pulse Rate: 152 (06/28 2020) Intake/Output from previous day:   Intake/Output from this shift: Total I/O In: 50 [I.V.:50] Out: 100 [Urine:100]  Labs:  Recent Labs  02/18/15 1615  WBC 22.6*  HGB 10.9*  PLT 320  CREATININE 1.74*   Estimated Creatinine Clearance: 55 mL/min (by C-G formula based on Cr of 1.74). No results for input(s): VANCOTROUGH, VANCOPEAK, VANCORANDOM, GENTTROUGH, GENTPEAK, GENTRANDOM, TOBRATROUGH, TOBRAPEAK, TOBRARND, AMIKACINPEAK, AMIKACINTROU, AMIKACIN in the last 72 hours.   Microbiology: Recent Results (from the past 720 hour(s))  Culture, blood (routine x 2)     Status: None   Collection Time: 02/04/15  9:00 PM  Result Value Ref Range Status   Specimen Description BLOOD RIGHT ARM  Final   Special Requests BOTTLES DRAWN AEROBIC AND ANAEROBIC 5CC EA  Final   Culture   Final    NO GROWTH 5 DAYS Performed at [facility]    Report Status 02/11/2015 FINAL  Final  Culture, blood (routine x 2)     Status: None   Collection Time: 02/04/15  9:15 PM  Result Value Ref Range Status   Specimen Description BLOOD LEFT ARM  Final   Special Requests BOTTLES DRAWN AEROBIC AND ANAEROBIC 5CC EA  Final   Culture   Final    NO GROWTH 5 DAYS Performed at [facility]    Report Status 02/11/2015 FINAL  Final  Culture, sputum-assessment     Status: None   Collection Time: 02/06/15 11:27 AM  Result Value Ref Range Status   Specimen Description SPUTUM  Final   Special Requests NONE  Final   Sputum evaluation   Final    MICROSCOPIC FINDINGS  SUGGEST THAT THIS SPECIMEN IS NOT REPRESENTATIVE OF LOWER RESPIRATORY SECRETIONS. PLEASE RECOLLECT. Results Called to: R L'ESPERANCE,RN AT 1205 02/06/15 BY L BENFIELD    Report Status 02/06/2015 FINAL  Final  Culture, respiratory (NON-Expectorated)     Status: None   Collection Time: 02/06/15 12:38 PM  Result Value Ref Range Status   Specimen Description BRONCHIAL ALVEOLAR LAVAGE  Final   Special Requests Immunocompromised  Final   Gram Stain   Final    RARE WBC PRESENT,BOTH PMN AND MONONUCLEAR NO SQUAMOUS EPITHELIAL CELLS SEEN NO ORGANISMS SEEN Performed at [facility]    Culture   Final    Non-Pathogenic Oropharyngeal-type Flora Isolated. Performed at [facility]    Report Status 02/09/2015 FINAL  Final  Fungus Culture with Smear     Status: None (Preliminary result)   Collection Time: 02/06/15 12:38 PM  Result Value Ref Range Status   Specimen Description BRONCHIAL ALVEOLAR LAVAGE  Final   Special Requests Immunocompromised  Final   Fungal Smear   Final    NO YEAST OR FUNGAL ELEMENTS SEEN Performed at [facility]    Culture   Final    CULTURE IN PROGRESS FOR FOUR WEEKS Performed at [facility]    Report Status PENDING  Incomplete  AFB culture with smear     Status: None (Preliminary result)   Collection Time: 02/06/15 12:38  PM  Result Value Ref Range Status   Specimen Description BRONCHIAL ALVEOLAR LAVAGE  Final   Special Requests Immunocompromised  Final   Acid Fast Smear   Final    NO ACID FAST BACILLI SEEN [facility]formed at [facility]    Culture   Final    CULTURE WILL BE EXAMINED FOR 6 WEEKS BEFORE ISSUING A FINAL REPORT Performed at [facility]    Report Status PENDING  Incomplete  Pneumocystis smear by DFA     Status: None   Collection Time: 02/06/15 12:38 PM  Result Value Ref Range Status   Specimen Source-[facility] BRONCHIAL ALVEOLAR LAVAGE  Final   Pneumocystis jiroveci Ag NEGATIVE  Final    Comment:  Performed at [facility]    Medical History: Past Medical History  Diagnosis Date  . Diabetes mellitus without complication   . Hypertension   . GERD (gastroesophageal reflux disease)   . Anemia   . Sickle cell trait   . Gout   . Wears glasses   . Muscle pain   . Dermatomyositis     Medications:  Scheduled:  . allopurinol  300 mg Oral Daily  . cefTAZidime (FORTAZ)  IV  1 g Intravenous Q8H  . etomidate      . famotidine (PEPCID) IV  20 mg Intravenous Q12H  . fentaNYL      . fentaNYL      . fentaNYL (SUBLIMAZE) injection  50 mcg Intravenous Once  . [START ON 02/19/2015] folic acid  1 mg Oral Daily  . heparin  5,000 Units Subcutaneous 3 times per day  . insulin aspart  0-15 Units Subcutaneous 6 times per day  . lidocaine (cardiac) 100 mg/655ml      . methylPREDNISolone (SOLU-MEDROL) injection  40 mg Intravenous Q12H  . midazolam      . midazolam      . rocuronium      . succinylcholine       Infusions:  . sodium chloride    . fentaNYL infusion INTRAVENOUS    . propofol (DIPRIVAN) infusion 20 mcg/kg/min (02/18/15 2017)   Assessment: 59 yo male with pneumonia will be continued on vancomycin.  CrCl ~55.  Patient received a dose of vancomycin 2g iv at 1801 on 02/18/15  Goal of Therapy:  Vancomycin trough level 15-20 mcg/ml  Plan:  - Vancomycin 750 mg iv q12h, 1st dose at 1000 on 02/19/15 - monitor renal function - check vancomycin trough when it's appropriate  Huntley Demedeiros, Tsz-Yin 02/18/2015,8:54 PM

## 2015-02-18 NOTE — ED Notes (Signed)
Critical care at bedside. Pt remains to have tachypnea RR 32. Pt alert, follow commands. Noted to be coughing on bipap. Family remains at bedside.

## 2015-02-18 NOTE — ED Notes (Signed)
Hospitalist at bedside, consulting critical care for admission.

## 2015-02-18 NOTE — ED Notes (Signed)
NOTIFIED  DR Criss AlvineGOLDSTON OF PATIENTS LAB RESULTS OFI-STST TROPONIN.

## 2015-02-18 NOTE — Procedures (Signed)
Intubation Procedure Note Jerline PainDurwood A Collingsworth [id number] [birth year]  Procedure: Intubation Indications: Respiratory insufficiency  Procedure Details Consent: Risks of procedure as well as the alternatives and risks of each were explained to the (patient/caregiver).  Consent for procedure obtained. Time Out: Verified patient identification, verified procedure, site/side was marked, verified correct patient position, special equipment/implants available, medications/allergies/relevent history reviewed, required imaging and test results available.  Performed  Drugs Etomidate 20mg , Rocuronium 80mg  IV DL x 1 with GS 4 blade Grade 1 view 8-0 ET tube passed through cords under direct visualization Placement confirmed with bilateral breath sounds, positive EtCO2 change and smoke in tube   Evaluation Hemodynamic Status: BP stable throughout; O2 sats: stable throughout Patient's Current Condition: stable Complications: No apparent complications Patient did tolerate procedure well. Chest X-ray ordered to verify placement.  CXR: pending.   Max FickleMCQUAID, Carleah Yablonski 02/18/2015

## 2015-02-18 NOTE — Procedures (Deleted)
Intubation Procedure Note Derek Anthony [id number] March 29, 1956  Procedure: Intubation Indications: Respiratory insufficiency  Procedure Details Consent: Risks of procedure as well as the alternatives and risks of each were explained to the (patient/caregiver).  Consent for procedure obtained. Time Out: Verified patient identification, verified procedure, site/side was marked, verified correct patient position, special equipment/implants available, medications/allergies/relevent history reviewed, required imaging and test results available.  Performed  Drugs:  20 mg Etomidate, 80 mg Rocuronium. IDL x 1 using glidescope with # 4 blade. Grade 1 view. 8.0 tube visualized passing through vocal cords. Following intubation:  positive color change on ETCO2, condensation seen in endotracheal tube, equal breath sounds bilaterally.  Evaluation Hemodynamic Status: BP stable throughout; O2 sats: stable throughout Patient's Current Condition: stable Complications: No apparent complications Patient did tolerate procedure well. Chest X-ray ordered to verify placement.  CXR: pending.   Rutherford Guysahul Desai, [place] - [facility] Pager: [phone]  or [phone] 02/18/2015, 7:38 PM

## 2015-02-18 NOTE — ED Notes (Signed)
KNOTT ,PA FOR  PATIENT LAB RESULTS :32PM ,

## 2015-02-18 NOTE — ED Provider Notes (Signed)
CSN: [id number]     Arrival date & time 02/18/15  1601 History   First MD Initiated Contact with Patient 02/18/15 1608     Chief Complaint  Patient presents with  . Respiratory Distress     (Consider location/radiation/quality/duration/timing/severity/associated sxs/prior Treatment) Patient is a 59 y.o. male presenting with shortness of breath. The history is provided by the patient and the EMS personnel.  Shortness of Breath Severity:  Moderate Onset quality:  Sudden Duration:  1 day Timing:  Constant Progression:  Worsening Chronicity:  Recurrent Context comment:  Recent hospitalization Relieved by:  Nothing Worsened by:  Nothing tried Ineffective treatments:  None tried Associated symptoms: cough, diaphoresis, fever and wheezing   Associated symptoms: no hemoptysis   Risk factors: obesity   Risk factors comment:  Dermatomyositis   Past Medical History  Diagnosis Date  . Diabetes mellitus without complication   . Hypertension   . GERD (gastroesophageal reflux disease)   . Anemia   . Sickle cell trait   . Gout   . Wears glasses   . Muscle pain   . Dermatomyositis    Past Surgical History  Procedure Laterality Date  . Shoulder surgery    . Knee reconstruction Right   . Vasectomy    . Muscle biopsy Left 12/23/2014    Procedure: MUSCLE BIOPSY LEFT THIGH;  Surgeon: Claud Kelp, MD;  Location: [facility];  Service: General;  Laterality: Left;  . Video bronchoscopy Bilateral 02/06/2015    Procedure: VIDEO BRONCHOSCOPY WITHOUT FLUORO;  Surgeon: Lupita Leash, MD;  Location: [facility] ENDOSCOPY;  Service: Cardiopulmonary;  Laterality: Bilateral;   Family History  Problem Relation Age of Onset  . Hypertension Mother   . Diabetes Mother   . Hypertension Father    History  Substance Use Topics  . Smoking status: Former Smoker -- 1.00 packs/day for 20 years    Types: Cigarettes    Quit date: 10/29/1992  . Smokeless tobacco: Not on file  . Alcohol Use: No     Review of Systems  Constitutional: Positive for fever and diaphoresis.  Respiratory: Positive for cough, shortness of breath and wheezing. Negative for hemoptysis.   All other systems reviewed and are negative.     Allergies  Sulfa antibiotics and Penicillins  Home Medications   Prior to Admission medications   Medication Sig Start Date End Date Taking? Authorizing Provider  allopurinol (ZYLOPRIM) 300 MG tablet Take 1 tablet (300 mg total) by mouth daily. 02/13/15  Yes Vassie Loll, MD  aspirin EC 81 MG tablet Take 81 mg by mouth daily.   Yes Historical Provider, MD  cetirizine (ZYRTEC) 10 MG tablet Take 10 mg by mouth every evening.    Yes Historical Provider, MD  fluticasone (FLONASE) 50 MCG/ACT nasal spray Place 2 sprays into both nostrils daily. 02/13/15  Yes Vassie Loll, MD  fluticasone (FLOVENT HFA) 44 MCG/ACT inhaler Inhale 1 puff into the lungs 2 (two) times daily. 02/13/15  Yes Vassie Loll, MD  folic acid (FOLVITE) 1 MG tablet Take 1 mg by mouth daily.   Yes Historical Provider, MD  gabapentin (NEURONTIN) 600 MG tablet Take 1-2 tablets (600-1,200 mg total) by mouth 2 (two) times daily. TAKES 1200MG  IN AM AND 600MG  IN PM 02/13/15  Yes Vassie Loll, MD  HYDROcodone-acetaminophen (NORCO) 5-325 MG per tablet Take 1-2 tablets by mouth every 6 (six) hours as needed for moderate pain or severe pain. 12/23/14  Yes Claud Kelp, MD  insulin aspart protamine- aspart (NOVOLOG MIX  70/30) (70-30) 100 UNIT/ML injection Inject 40 Units into the skin 2 (two) times daily.    Yes Historical Provider, MD  insulin glargine (LANTUS) 100 UNIT/ML injection Inject 0.4 mLs (40 Units total) into the skin at bedtime. Patient taking differently: Inject 35 Units into the skin 2 (two) times daily.  11/05/14  Yes Christiane Ha, MD  insulin glargine (LANTUS) 100 UNIT/ML injection Inject 0.42 mLs (42 Units total) into the skin 2 (two) times daily. 02/13/15  Yes Vassie Loll, MD  ketotifen (ZADITOR)  0.025 % ophthalmic solution Place 1 drop into both eyes daily as needed.   Yes Historical Provider, MD  meloxicam (MOBIC) 7.5 MG tablet Take 1 tablet (7.5 mg total) by mouth daily. 11/05/14  Yes Christiane Ha, MD  montelukast (SINGULAIR) 10 MG tablet Take 1 tablet (10 mg total) by mouth at bedtime. 02/13/15  Yes Vassie Loll, MD  omeprazole (PRILOSEC) 40 MG capsule Take 1 capsule (40 mg total) by mouth daily. 02/13/15  Yes Vassie Loll, MD  predniSONE (DELTASONE) 20 MG tablet Take 3 tablets (60 mg total) by mouth daily with breakfast. 02/13/15  Yes Vassie Loll, MD  traMADol (ULTRAM) 50 MG tablet Take 50 mg by mouth every 6 (six) hours as needed for moderate pain.   Yes Historical Provider, MD  olmesartan (BENICAR) 40 MG tablet Take 1 tablet (40 mg total) by mouth daily. 02/13/15   Vassie Loll, MD   BP 81/56 mmHg  Pulse 88  Temp(Src) 97.5 F (36.4 C) (Oral)  Resp 24  Wt 216 lb 7.9 oz (98.2 kg)  SpO2 100% Physical Exam  Constitutional: He is oriented to person, place, and time. He appears well-developed and well-nourished. He appears ill. He appears distressed.  HENT:  Head: Normocephalic and atraumatic.  Eyes: Conjunctivae are normal.  Neck: Neck supple. No tracheal deviation present.  Cardiovascular: Regular rhythm and normal heart sounds.  Tachycardia present.   Pulmonary/Chest: Accessory muscle usage present. Tachypnea noted. He is in respiratory distress. He has rhonchi (diffuse bilateral).  Abdominal: Soft. He exhibits no distension.  Neurological: He is alert and oriented to person, place, and time.  Skin: Skin is warm and dry.  Psychiatric: He has a normal mood and affect.    ED Course  Procedures (including critical care time) Labs Review Labs Reviewed  BASIC METABOLIC PANEL - Abnormal; Notable for the following:    CO2 21 (*)    Glucose, Bld 171 (*)    BUN 26 (*)    Creatinine, Ser 1.74 (*)    Calcium 7.9 (*)    GFR calc non Af Amer 41 (*)    GFR calc Af Amer 47  (*)    All other components within normal limits  CBC WITH DIFFERENTIAL/PLATELET - Abnormal; Notable for the following:    WBC 22.6 (*)    RBC 4.09 (*)    Hemoglobin 10.9 (*)    HCT 35.2 (*)    RDW 21.3 (*)    Neutrophils Relative % 91 (*)    Lymphocytes Relative 3 (*)    Neutro Abs 20.5 (*)    Monocytes Absolute 1.4 (*)    All other components within normal limits  URINALYSIS, ROUTINE W REFLEX MICROSCOPIC (NOT AT [facility]) - Abnormal; Notable for the following:    APPearance HAZY (*)    Hgb urine dipstick TRACE (*)    Protein, ur 30 (*)    All other components within normal limits  GLUCOSE, CAPILLARY - Abnormal; Notable for the following:  Glucose-Capillary 138 (*)    All other components within normal limits  URINALYSIS, ROUTINE W REFLEX MICROSCOPIC (NOT AT [facility]) - Abnormal; Notable for the following:    Hgb urine dipstick TRACE (*)    Protein, ur 30 (*)    All other components within normal limits  URINE MICROSCOPIC-ADD ON - Abnormal; Notable for the following:    Squamous Epithelial / LPF FEW (*)    Bacteria, UA FEW (*)    Casts GRANULAR CAST (*)    All other components within normal limits  GLUCOSE, CAPILLARY - Abnormal; Notable for the following:    Glucose-Capillary 127 (*)    All other components within normal limits  I-STAT TROPOININ, ED - Abnormal; Notable for the following:    Troponin i, poc 0.17 (*)    All other components within normal limits  I-STAT ARTERIAL BLOOD GAS, ED - Abnormal; Notable for the following:    pH, Arterial 7.479 (*)    pCO2 arterial 32.6 (*)    pO2, Arterial 110.0 (*)    Bicarbonate 24.2 (*)    All other components within normal limits  I-STAT CG4 LACTIC ACID, ED - Abnormal; Notable for the following:    Lactic Acid, Venous 4.03 (*)    All other components within normal limits  I-STAT ARTERIAL BLOOD GAS, ED - Abnormal; Notable for the following:    pH, Arterial 7.208 (*)    pCO2 arterial 67.7 (*)    Bicarbonate 27.0 (*)    All other  components within normal limits  CULTURE, BLOOD (ROUTINE X 2)  CULTURE, BLOOD (ROUTINE X 2)  MRSA PCR SCREENING  PNEUMOCYSTIS JIROVECI SMEAR BY DFA  CULTURE, RESPIRATORY (NON-EXPECTORATED)  AFB CULTURE WITH SMEAR  RESPIRATORY VIRUS PANEL  FUNGUS CULTURE W SMEAR  CULTURE, BLOOD (ROUTINE X 2)  BRAIN NATRIURETIC PEPTIDE  STREP PNEUMONIAE URINARY ANTIGEN  CREATININE, URINE, RANDOM  SODIUM, URINE, RANDOM  URINE MICROSCOPIC-ADD ON  CALCIUM, IONIZED  BLOOD GAS, ARTERIAL  CBC  BASIC METABOLIC PANEL  LEGIONELLA ANTIGEN, URINE    Imaging Review Dg Chest Port 1 View  02/18/2015   CLINICAL DATA:  59 year old male with a history of respiratory distress and endotracheal tube placement  EXAM: PORTABLE CHEST - 1 VIEW  COMPARISON:  02/18/2015, chest CT 02/04/2015  FINDINGS: Cardiomediastinal silhouette likely unchanged in size and contour, partially obscured by overlying lung and pleural disease.  Low lung volumes, with bilateral airspace opacities, with worsening aeration. No visualized pneumothorax.  Interval placement of endotracheal tube, which terminates approximately 4.2 cm above the carina.  Interval placement of gastric tube, which terminates the left upper quadrant.  Overlying EKG leads.  IMPRESSION: Interval placement of endotracheal tube, terminating suitably above the carina.  Interval placement of a gastric tube.  Persisting bilateral airspace disease, slightly worsened aeration from the comparison.  Signed,  Yvone Neu. Loreta Ave, DO  [facility]  [facility]   Electronically Signed   By: Gilmer Mor D.O.   On: 02/18/2015 20:23   Dg Chest Port 1 View  02/18/2015   CLINICAL DATA:  Respiratory distress  EXAM: PORTABLE CHEST - 1 VIEW  COMPARISON:  Six/14/16  FINDINGS: There is worsening in aeration with patchy airspace opacification bilaterally. Findings suspicious for bilateral multifocal asymmetric pneumonia or pulmonary edema. Clinical correlation is  necessary.  IMPRESSION: Worsening in aeration. Patchy airspace disease bilateral upper lobes and mild interstitial prominence bilaterally. Findings suspicious for multifocal pneumonia or pulmonary edema. Clinical correlation is necessary   Electronically Signed  By: Natasha MeadLiviu  Pop M.D.   On: 02/18/2015 16:36     EKG Interpretation   Date/Time:  Tuesday February 18 2015 16:18:31 EDT Ventricular Rate:  142 PR Interval:  133 QRS Duration: 75 QT Interval:  325 QTC Calculation: 499 R Axis:   18 Text Interpretation:  Sinus tachycardia Low voltage, extremity leads  Posterior infarct, old Rate is faster compared to February 04 2015 Confirmed  by Criss AlvineGOLDSTON  MD, SCOTT [phone]([id number]) on 02/18/2015 4:25:52 PM      MDM   Final diagnoses:  Encounter for intubation   acute respiratory failure with hypoxia Healthcare associated pneumonia Severe sepsis  59 year old male presents with ongoing worsening shortness of breath at home, was severely hypoxic on EMS arrival to 50%, had increased work of breathing on arrival here, was found to have multiple airspace opacities and an elevated white blood cell count concerning for healthcare associated pneumonia with recent admission to the hospital. He improved greatly on BiPAP therapy but remained severely tachypneic. Hospitalist was consulted for admission to stepdown as he appeared to have stabilized but at this point with his respiratory rate still high, critical care was consulted and made the decision to intubate the patient in the emergency department prior to transfer to the ICU. He remained hemodynamically stable throughout his stay in the emergency department. He was fluid resuscitated for severe sepsis and given vancomycin and a cephalosporin as he has a noted allergy to penicillins.    Lyndal Pulleyaniel Tsering Leaman, MD 02/19/15 [phone]  Pricilla LovelessScott Goldston, MD 02/20/15 [phone]

## 2015-02-19 LAB — GLUCOSE, CAPILLARY
GLUCOSE-CAPILLARY: 132 mg/dL — AB (ref 65–99)
GLUCOSE-CAPILLARY: 113 mg/dL — AB (ref 65–99)
GLUCOSE-CAPILLARY: 188 mg/dL — AB (ref 65–99)
Glucose-Capillary: 144 mg/dL — ABNORMAL HIGH (ref 65–99)
Glucose-Capillary: 122 mg/dL — ABNORMAL HIGH (ref 65–99)

## 2015-02-19 LAB — CBC
HCT: 28.9 % — ABNORMAL LOW (ref 39.0–52.0)
Hemoglobin: 8.8 g/dL — ABNORMAL LOW (ref 13.0–17.0)
MCH: 25.9 pg — AB (ref 26.0–34.0)
MCHC: 30.4 g/dL (ref 30.0–36.0)
MCV: 85 fL (ref 78.0–100.0)
Platelets: 261 10*3/uL (ref 150–400)
RBC: 3.4 MIL/uL — ABNORMAL LOW (ref 4.22–5.81)
RDW: 21.3 % — ABNORMAL HIGH (ref 11.5–15.5)
WBC: 14.6 10*3/uL — ABNORMAL HIGH (ref 4.0–10.5)

## 2015-02-19 LAB — BASIC METABOLIC PANEL
Anion gap: 7 (ref 5–15)
BUN: 26 mg/dL — ABNORMAL HIGH (ref 6–20)
CO2: 22 mmol/L (ref 22–32)
Calcium: 7 mg/dL — ABNORMAL LOW (ref 8.9–10.3)
Chloride: 115 mmol/L — ABNORMAL HIGH (ref 101–111)
Creatinine, Ser: 1.47 mg/dL — ABNORMAL HIGH (ref 0.61–1.24)
GFR calc Af Amer: 58 mL/min — ABNORMAL LOW (ref >60)
GFR, EST NON AFRICAN AMERICAN: 50 mL/min — AB (ref >60)
GLUCOSE: 138 mg/dL — AB (ref 65–99)
Potassium: 5.1 mmol/L (ref 3.5–5.1)
Sodium: 144 mmol/L (ref 135–145)

## 2015-02-19 LAB — MRSA PCR SCREENING: MRSA BY PCR: POSITIVE — AB

## 2015-02-19 LAB — STREP PNEUMONIAE URINARY ANTIGEN: Strep Pneumo Urinary Antigen: NEGATIVE

## 2015-02-19 LAB — PHOSPHORUS: PHOSPHORUS: 5.6 mg/dL — AB (ref 2.5–4.6)

## 2015-02-19 LAB — TRIGLYCERIDES: Triglycerides: 307 mg/dL — ABNORMAL HIGH (ref <150)

## 2015-02-19 LAB — MAGNESIUM: MAGNESIUM: 2.3 mg/dL (ref 1.7–2.4)

## 2015-02-19 MED ORDER — MUPIROCIN 2 % EX OINT: 1.0000 "application " | TOPICAL_OINTMENT | Freq: Two times a day (BID) | CUTANEOUS | Status: DC

## 2015-02-19 MED ORDER — CHLORHEXIDINE GLUCONATE CLOTH 2 % EX PADS
6.0000 | MEDICATED_PAD | Freq: Every day | CUTANEOUS | Status: AC
  Administered 2015-02-20 – 2015-02-24 (×5): 6 via TOPICAL

## 2015-02-19 MED ORDER — SODIUM CHLORIDE 0.9 % IV SOLN
1.0000 g | Freq: Once | INTRAVENOUS | Status: AC
  Administered 2015-02-19: 1 g via INTRAVENOUS
  Filled 2015-02-19: qty 10

## 2015-02-19 MED ORDER — CHLORHEXIDINE GLUCONATE 0.12 % MT SOLN: 15.0000 mL | Freq: Two times a day (BID) | OROMUCOSAL | Status: DC

## 2015-02-19 MED ORDER — VITAL HIGH PROTEIN PO LIQD: 1000.0000 mL | ORAL | Status: DC

## 2015-02-19 MED ORDER — VITAL AF 1.2 CAL PO LIQD
1000.0000 mL | ORAL | Status: DC
  Administered 2015-02-19 – 2015-02-24 (×6): 1000 mL
  Filled 2015-02-19 (×13): qty 1000

## 2015-02-19 MED ORDER — CHLORHEXIDINE GLUCONATE 0.12 % MT SOLN
15.0000 mL | Freq: Two times a day (BID) | OROMUCOSAL | Status: DC
  Administered 2015-02-19 – 2015-03-01 (×21): 15 mL via OROMUCOSAL
  Filled 2015-02-19 (×19): qty 15

## 2015-02-19 MED ORDER — PRO-STAT SUGAR FREE PO LIQD
30.0000 mL | Freq: Two times a day (BID) | ORAL | Status: AC
  Administered 2015-02-19 (×2): 30 mL
  Filled 2015-02-19 (×2): qty 30

## 2015-02-19 MED ORDER — CHLORHEXIDINE GLUCONATE CLOTH 2 % EX PADS: 6.0000 | MEDICATED_PAD | Freq: Every day | CUTANEOUS | Status: DC

## 2015-02-19 MED ORDER — CETYLPYRIDINIUM CHLORIDE 0.05 % MT LIQD
7.0000 mL | Freq: Four times a day (QID) | OROMUCOSAL | Status: DC
  Administered 2015-02-19: 7 mL via OROMUCOSAL

## 2015-02-19 MED ORDER — CETYLPYRIDINIUM CHLORIDE 0.05 % MT LIQD
7.0000 mL | Freq: Four times a day (QID) | OROMUCOSAL | Status: DC
  Administered 2015-02-19 – 2015-03-01 (×42): 7 mL via OROMUCOSAL

## 2015-02-19 MED ORDER — MUPIROCIN 2 % EX OINT
1.0000 "application " | TOPICAL_OINTMENT | Freq: Two times a day (BID) | CUTANEOUS | Status: AC
  Administered 2015-02-19 – 2015-02-23 (×10): 1 via NASAL
  Filled 2015-02-19: qty 22

## 2015-02-19 NOTE — Progress Notes (Signed)
Initial Nutrition Assessment  INTERVENTION:  Tube feeding: Initiate TF via OGT with Vital AF 1.2 at 25 ml/h and Prostat 30 ml BID on day 1; on day 2, increase to goal rate of 70 ml/h (1680 ml per day) and discontinue Pro-stat to provide 2016 kcals, 126 gm protein, 1361 ml free water daily. TF plus current rate of propofol will provide 2494 kcal (98% of estimated needs).    NUTRITION DIAGNOSIS:  Inadequate oral intake related to inability to eat as evidenced by NPO status.  GOAL:  Patient will meet greater than or equal to 90% of their needs   MONITOR:  Vent status, TF tolerance, Weight trends, Labs, I & O's  REASON FOR ASSESSMENT:  Ventilator, Consult Enteral/tube feeding initiation and management  ASSESSMENT: 59 y.o. M with recent dx dermatomyositis, [facility] for worsening dyspnea and weakness. Intubated in ED by PCCM and admitted to ICU.  Patient is currently intubated on ventilator support MV: 14 L/min Temp (24hrs), Avg:99.4 F (37.4 C), Min:97.5 F (36.4 C), Max:102.6 F (39.2 C)  Propofol: 18.1 ml/hr (provides 478 kcal per 24 hours)  Labs: low hemoglobin, low calcium   Height:  Ht Readings from Last 1 Encounters:  02/05/15 6\' 1"  (1.854 m)    Weight:  Wt Readings from Last 1 Encounters:  02/19/15 225 lb 1.4 oz (102.1 kg)    Ideal Body Weight:  83.6 kg  Wt Readings from Last 10 Encounters:  02/19/15 225 lb 1.4 oz (102.1 kg)  02/05/15 227 lb 12.8 oz (103.329 kg)  12/23/14 214 lb 4 oz (97.183 kg)  11/02/14 223 lb (101.152 kg)  11/01/14 227 lb 4.7 oz (103.1 kg)  05/21/13 255 lb (115.667 kg)  02/18/15 98.2 kg  BMI:  Body mass index is 29.7 kg/(m^2). (Overweight)  Estimated Nutritional Needs:  Kcal:  2539  Protein:  120-140 grams  Fluid:  2.5 L/day  Skin:  Wound (see comment) (small puncture wound on buttocks)  Diet Order:   NPO  EDUCATION NEEDS:  No education needs identified at this time   Intake/Output Summary (Last 24  hours) at 02/19/15 1116 Last data filed at 02/19/15 1100  Gross per 24 hour  Intake 2584.47 ml  Output   1055 ml  Net 1529.47 ml    Last BM:  PTA  Ian Malkineanne Barnett RD, LDN Inpatient Clinical Dietitian Pager: [phone] After Hours Pager: [phone]

## 2015-02-19 NOTE — Progress Notes (Signed)
PULMONARY / CRITICAL CARE MEDICINE   Name: Derek Anthony A Dillow MRN: [id number] DOB: June 13, 1956    ADMISSION DATE:  02/18/2015 CONSULTATION DATE:  02/19/2015  REFERRING MD :  EDP  CHIEF COMPLAINT:  Dyspnea, respiratory distress  INITIAL PRESENTATION:  59 y.o. M with recent dx dermatomyositis, [facility]/28 for worsening dyspnea and weakness.  Intubated in ED by PCCM and admitted to ICU.   STUDIES:  FOB 6/28 >>> BAL culture - neg, PCP - neg, AFB - neg, Cell Ct - 463 WBC, 65% mono, 27% pmn, 8% lymph, 0% eos, Cytology - neg for CA , Flow Cytometry - non diagnostic, BAL Fungal Culture - neg CXR 6/28 >>> worsening aeration with bilateral opacities.  Concerning for multifocal PNA vs edema.  SIGNIFICANT EVENTS: 6/14 through 6/23 - admitted. 6/28 - re-admitted with worsening hypoxic respiratory failure.    HISTORY OF PRESENT ILLNESS:  Pt is encephalopathic; therefore, this HPI is obtained from chart review. Derek Anthony is a 59 y.o. M with PMH as outlined below including recent diagnosis of dermatomyositis (diagnosed by Dr. Nickola MajorHawkes with rheumatology).  He is s/p 2 cycles of Methotrexate last being 6/13 and has since been on prednisone.He had recent admission for dyspnea and was treated with pulse dose steroids as well as empiric abx for CAP. He was discharged 02/13/15 with follow up scheduled with both pulmonology 6/28 (Dr. Kendrick FriesMcQuaid) and rheumatology 6/30 (Dr. Nickola MajorHawkes).  On afternoon of 6/28, pt was profoundly more dyspneic and could not ambulate more than a few feet without having to stop and rest.  Per wife, he actually fell twice due to weakness and imbalance.  He has apparently felt bad since being discharged from the hospital and has progressively gotten worse.  He has had persistent cough productive of yellow sputum as well as generalized weakness and dyspnea with occasional wheezing.  He denies any fevers/chills/sweats, chest pain, hemoptysis, N/V/D, abdominal pain, myalgias, LE swelling /  pain.  Denies any recent travel.  Was immobilized for almost 48 hours this past weekend due to weakness and he basically slept all weekend long  On ED arrival, he was hypoxic with sats in the 50's on RA.  He was also tachypneic in the 40's with sinus tach, HR of 120's.  He was placed on BiPAP which pt subjectively felt helped his symptoms.  PCCM was called and asked to evaluate for admission.   SUBJECTIVE:   Intubated Wide awake FiO2 0.80, PEEP 10  VITAL SIGNS: Temp:  [97.5 F (36.4 C)-102.6 F (39.2 C)] 97.6 F (36.4 C) (06/29 0349) Pulse Rate:  [81-160] 89 (06/29 0757) Resp:  [14-41] 34 (06/29 0757) BP: (68-212)/(49-109) 151/84 mmHg (06/29 0757) SpO2:  [82 %-100 %] 96 % (06/29 0757) FiO2 (%):  [40 %-100 %] 80 % (06/29 0757) Weight:  [98.2 kg (216 lb 7.9 oz)-102.1 kg (225 lb 1.4 oz)] 102.1 kg (225 lb 1.4 oz) (06/29 0425) HEMODYNAMICS:   VENTILATOR SETTINGS: Vent Mode:  [-] PRVC FiO2 (%):  [40 %-100 %] 80 % Set Rate:  [14 bmp-24 bmp] 20 bmp Vt Set:  [550 mL-590 mL] 590 mL PEEP:  [10 cmH20] 10 cmH20 Plateau Pressure:  [20 cmH20-31 cmH20] 20 cmH20 INTAKE / OUTPUT: Intake/Output      06/28 0701 - 06/29 0700 06/29 0701 - 06/30 0700   I.V. (mL/kg) 938.7 (9.2)    NG/GT 30    IV Piggyback 1150    Total Intake(mL/kg) 2118.7 (20.8)    Urine (mL/kg/hr) 730  Total Output 730     Net +1388.7            PHYSICAL EXAMINATION: General: WDWN male, intubated no distress Neuro: A&O x 3, non-focal. Quite awake given on MV HEENT: [place]/AT. PERRL, sclerae anicteric. Cardiovascular: Tachy, regular, no M/R/G.  Lungs: Respirations even and unlabored.  Faint rhonchi bilateral bases. Abdomen: BS x 4, soft, NT/ND.  Musculoskeletal: No gross deformities, no edema.  Skin: Intact, warm.  Gottron papules noted on R hand.  Bilateral mechanics hands noted.  LABS:  CBC  Recent Labs Lab 02/18/15 1615 02/19/15 0224  WBC 22.6* 14.6*  HGB 10.9* 8.8*  HCT 35.2* 28.9*  PLT 320 261    Coag's No results for input(s): APTT, INR in the last 168 hours. BMET  Recent Labs Lab 02/18/15 1615 02/19/15 0224  NA 142 144  K 4.8 5.1  CL 108 115*  CO2 21* 22  BUN 26* 26*  CREATININE 1.74* 1.47*  GLUCOSE 171* 138*   Electrolytes  Recent Labs Lab 02/18/15 1615 02/19/15 0224  CALCIUM 7.9* 7.0*   Sepsis Markers  Recent Labs Lab 02/18/15 1723  LATICACIDVEN 4.03*   ABG  Recent Labs Lab 02/18/15 1653 02/18/15 2014  PHART 7.479* 7.208*  PCO2ART 32.6* 67.7*  PO2ART 110.0* 84.0   Liver Enzymes No results for input(s): AST, ALT, ALKPHOS, BILITOT, ALBUMIN in the last 168 hours. Cardiac Enzymes No results for input(s): TROPONINI, PROBNP in the last 168 hours. Glucose  Recent Labs Lab 02/13/15 0743 02/13/15 0817 02/13/15 1137 02/18/15 2039 02/18/15 2328 02/19/15 0346  GLUCAP 31* 90 158* 138* 127* 132*    Imaging Dg Chest Port 1 View  02/18/2015   CLINICAL DATA:  59 year old male with a history of respiratory distress and endotracheal tube placement  EXAM: PORTABLE CHEST - 1 VIEW  COMPARISON:  02/18/2015, chest CT 02/04/2015  FINDINGS: Cardiomediastinal silhouette likely unchanged in size and contour, partially obscured by overlying lung and pleural disease.  Low lung volumes, with bilateral airspace opacities, with worsening aeration. No visualized pneumothorax.  Interval placement of endotracheal tube, which terminates approximately 4.2 cm above the carina.  Interval placement of gastric tube, which terminates the left upper quadrant.  Overlying EKG leads.  IMPRESSION: Interval placement of endotracheal tube, terminating suitably above the carina.  Interval placement of a gastric tube.  Persisting bilateral airspace disease, slightly worsened aeration from the comparison.  Signed,  Yvone Neu. Loreta Ave, DO  [facility]  [facility]   Electronically Signed   By: Gilmer Mor D.O.   On: 02/18/2015 20:23   Dg Chest  Port 1 View  02/18/2015   CLINICAL DATA:  Respiratory distress  EXAM: PORTABLE CHEST - 1 VIEW  COMPARISON:  Six/14/16  FINDINGS: There is worsening in aeration with patchy airspace opacification bilaterally. Findings suspicious for bilateral multifocal asymmetric pneumonia or pulmonary edema. Clinical correlation is necessary.  IMPRESSION: Worsening in aeration. Patchy airspace disease bilateral upper lobes and mild interstitial prominence bilaterally. Findings suspicious for multifocal pneumonia or pulmonary edema. Clinical correlation is necessary   Electronically Signed   By: Natasha Mead M.D.   On: 02/18/2015 16:36    ASSESSMENT / PLAN:  PULMONARY OETT 6/28 >>> A: Acute hypoxic respiratory failure in setting worsening bilateral airspace disease. Probable HCAP given hx and CXR findings + being on chronic prednisone. Baseline dermatomyositis - s/p 2 cycles methotrexate and on daily prednisone. Consider new infiltrates in setting mtx, but clinical picture more consistent with progression of his underlying  inflammatory process.  P:   Full mechanical support, wean PEEP and FiO2 as able.  Bronch for BAL cultures are pending VAP bundle. Hold SBT for next 2 - 3 days to facilitate pulmonary rest / recovery.' Abx per ID section. Solumedrol ordered, Levalbuterol PRN. Follow CXR If no improvement over next 2 - 3 days despite abx therapy, then will need to consider VATS with biopsy vs high dose anti-inflammatory regimen (steroids, cytoxan)  CARDIOVASCULAR A:  Sinus tach. Hx HTN. P:  Lopressor PRN. Hold outpatient olmesartan.  RENAL A:   AKI, improved am 6/29 ? Hypocalcemia - no albumin to correct for. P:   NS @ 75. Calcium supp 6/29 BMP in AM.  GASTROINTESTINAL A:   GERD. Nutrition. P:   SUP: Famotidine. NPO. TF's  HEMATOLOGIC A:   Chronic anemia. VTE Prophylaxis. P:  SCD's / Heparin. Follow CBC  INFECTIOUS A:   Probable HCAP given hx and CXR findings + being on chronic  prednisone. P:   BCx2 6/28 > BAL 6/28 > RVP 6/29 >>  U. Strep 6/28 > negative U. Legionella 6/28 > PCP smear 6/28 > Abx: Vanc, start date 6/28, day 1/x. Abx: Ceftaz, start date 6/28, day 1/x.  Will need to consider opportunistic organisms, consider adding bactrim for PCP  ENDOCRINE A:   DM. P:   SSI.  NEUROLOGIC A:   Acute encephalopathy due to sedation. P:   Sedation:  Propofol gtt / Fentanyl PRN. RASS goal: 0 to -1. Daily WUA. Hold outpatient gabapentin, norco.  RHEUMATOLOGIC A: Baseline dermatomyositis - s/p 2 cycles methotrexate and on daily prednisone. P: Solumedrol. Will follow up with [name] at outpatient, will likely require immunotherapy (cellcept, etc).   Family updated: Wife and sons at bedside.  Interdisciplinary Family Meeting v Palliative Care Meeting:  Due by: 02/24/15.  Independent CC time 35 minutes  Levy Pupa, MD, PhD 02/19/2015, 8:30 AM [facility] [phone] or if no answer [phone]

## 2015-02-20 ENCOUNTER — Inpatient Hospital Stay (HOSPITAL_COMMUNITY): Payer: 59

## 2015-02-20 DIAGNOSIS — R918 Other nonspecific abnormal finding of lung field: Secondary | ICD-10-CM

## 2015-02-20 DIAGNOSIS — I959 Hypotension, unspecified: Secondary | ICD-10-CM | POA: Insufficient documentation

## 2015-02-20 LAB — POCT I-STAT 3, ART BLOOD GAS (G3+)
ACID-BASE DEFICIT: 4 mmol/L — AB (ref 0.0–2.0)
Acid-base deficit: 5 mmol/L — ABNORMAL HIGH (ref 0.0–2.0)
Acid-base deficit: 5 mmol/L — ABNORMAL HIGH (ref 0.0–2.0)
Acid-base deficit: 5 mmol/L — ABNORMAL HIGH (ref 0.0–2.0)
Acid-base deficit: 3 mmol/L — ABNORMAL HIGH (ref 0.0–2.0)
BICARBONATE: 21.3 meq/L (ref 20.0–24.0)
BICARBONATE: 22.3 meq/L (ref 20.0–24.0)
Bicarbonate: 21.4 mEq/L (ref 20.0–24.0)
Bicarbonate: 21.2 mEq/L (ref 20.0–24.0)
Bicarbonate: 20.8 mEq/L (ref 20.0–24.0)
O2 SAT: 86 %
O2 SAT: 94 %
O2 SAT: 95 %
O2 Saturation: 92 %
O2 Saturation: 90 %
PCO2 ART: 41.7 mmHg (ref 35.0–45.0)
PCO2 ART: 41.1 mmHg (ref 35.0–45.0)
PCO2 ART: 43.7 mmHg (ref 35.0–45.0)
PCO2 ART: 42.8 mmHg (ref 35.0–45.0)
PH ART: 7.287 — AB (ref 7.350–7.450)
PH ART: 7.289 — AB (ref 7.350–7.450)
PO2 ART: 79 mmHg — AB (ref 80.0–100.0)
Patient temperature: 98.9
Patient temperature: 98.9
Patient temperature: 98.9
Patient temperature: 99.1
Patient temperature: 99.1
TCO2: 23 mmol/L (ref 0–100)
TCO2: 22 mmol/L (ref 0–100)
TCO2: 22 mmol/L (ref 0–100)
TCO2: 23 mmol/L (ref 0–100)
TCO2: 24 mmol/L (ref 0–100)
pCO2 arterial: 44.7 mmHg (ref 35.0–45.0)
pH, Arterial: 7.32 — ABNORMAL LOW (ref 7.350–7.450)
pH, Arterial: 7.321 — ABNORMAL LOW (ref 7.350–7.450)
pH, Arterial: 7.326 — ABNORMAL LOW (ref 7.350–7.450)
pO2, Arterial: 69 mmHg — ABNORMAL LOW (ref 80.0–100.0)
pO2, Arterial: 65 mmHg — ABNORMAL LOW (ref 80.0–100.0)
pO2, Arterial: 58 mmHg — ABNORMAL LOW (ref 80.0–100.0)
pO2, Arterial: 81 mmHg (ref 80.0–100.0)

## 2015-02-20 LAB — RESPIRATORY VIRUS PANEL
Adenovirus: NEGATIVE
Influenza A: NEGATIVE
Influenza B: NEGATIVE
METAPNEUMOVIRUS: NEGATIVE
PARAINFLUENZA 2 A: NEGATIVE
PARAINFLUENZA 3 A: NEGATIVE
Parainfluenza 1: NEGATIVE
Respiratory Syncytial Virus A: NEGATIVE
Respiratory Syncytial Virus B: NEGATIVE
Rhinovirus: NEGATIVE

## 2015-02-20 LAB — PHOSPHORUS
PHOSPHORUS: 4.4 mg/dL (ref 2.5–4.6)
Phosphorus: 4.3 mg/dL (ref 2.5–4.6)

## 2015-02-20 LAB — BASIC METABOLIC PANEL
ANION GAP: 8 (ref 5–15)
BUN: 28 mg/dL — AB (ref 6–20)
CALCIUM: 7.7 mg/dL — AB (ref 8.9–10.3)
CHLORIDE: 114 mmol/L — AB (ref 101–111)
CO2: 23 mmol/L (ref 22–32)
Creatinine, Ser: 1.25 mg/dL — ABNORMAL HIGH (ref 0.61–1.24)
GFR calc Af Amer: >60 mL/min (ref >60)
GFR calc non Af Amer: >60 mL/min (ref >60)
GLUCOSE: 282 mg/dL — AB (ref 65–99)
POTASSIUM: 4.8 mmol/L (ref 3.5–5.1)
SODIUM: 145 mmol/L (ref 135–145)

## 2015-02-20 LAB — CBC
HEMATOCRIT: 28.7 % — AB (ref 39.0–52.0)
HEMOGLOBIN: 8.9 g/dL — AB (ref 13.0–17.0)
MCH: 26.5 pg (ref 26.0–34.0)
MCHC: 31 g/dL (ref 30.0–36.0)
MCV: 85.4 fL (ref 78.0–100.0)
PLATELETS: 272 10*3/uL (ref 150–400)
RBC: 3.36 MIL/uL — ABNORMAL LOW (ref 4.22–5.81)
RDW: 21.2 % — ABNORMAL HIGH (ref 11.5–15.5)
WBC: 14.4 10*3/uL — ABNORMAL HIGH (ref 4.0–10.5)

## 2015-02-20 LAB — GLUCOSE, CAPILLARY
GLUCOSE-CAPILLARY: 232 mg/dL — AB (ref 65–99)
Glucose-Capillary: 259 mg/dL — ABNORMAL HIGH (ref 65–99)
Glucose-Capillary: 278 mg/dL — ABNORMAL HIGH (ref 65–99)
Glucose-Capillary: 335 mg/dL — ABNORMAL HIGH (ref 65–99)
Glucose-Capillary: 324 mg/dL — ABNORMAL HIGH (ref 65–99)
Glucose-Capillary: 272 mg/dL — ABNORMAL HIGH (ref 65–99)
Glucose-Capillary: 237 mg/dL — ABNORMAL HIGH (ref 65–99)

## 2015-02-20 LAB — PNEUMOCYSTIS JIROVECI SMEAR BY DFA: PNEUMOCYSTIS JIROVECI AG: NEGATIVE

## 2015-02-20 LAB — LEGIONELLA ANTIGEN, URINE

## 2015-02-20 LAB — CALCIUM, IONIZED: CALCIUM, IONIZED, SERUM: 4.4 mg/dL — AB (ref 4.5–5.6)

## 2015-02-20 LAB — MAGNESIUM
MAGNESIUM: 2.8 mg/dL — AB (ref 1.7–2.4)
Magnesium: 2.6 mg/dL — ABNORMAL HIGH (ref 1.7–2.4)

## 2015-02-20 MED ORDER — MIDAZOLAM HCL 2 MG/2ML IJ SOLN: 2.0000 mg | INTRAMUSCULAR | Status: DC | PRN

## 2015-02-20 MED ORDER — FENTANYL BOLUS VIA INFUSION
50.0000 ug | INTRAVENOUS | Status: DC | PRN
  Administered 2015-02-20: 50 ug via INTRAVENOUS
  Filled 2015-02-20: qty 50

## 2015-02-20 MED ORDER — PHENYLEPHRINE HCL 10 MG/ML IJ SOLN
30.0000 ug/min | INTRAMUSCULAR | Status: DC
  Administered 2015-02-20: 25 ug/min via INTRAVENOUS
  Administered 2015-02-20: 30 ug/min via INTRAVENOUS
  Administered 2015-02-21: 25 ug/min via INTRAVENOUS
  Administered 2015-02-21 (×3): 30 ug/min via INTRAVENOUS
  Filled 2015-02-20 (×7): qty 1

## 2015-02-20 MED ORDER — SODIUM CHLORIDE 0.9 % IV SOLN
0.0000 mg/h | INTRAVENOUS | Status: DC
  Administered 2015-02-20: 4 mg/h via INTRAVENOUS
  Administered 2015-02-21 – 2015-02-22 (×3): 10 mg/h via INTRAVENOUS
  Filled 2015-02-20 (×8): qty 10

## 2015-02-20 MED ORDER — FENTANYL CITRATE (PF) 100 MCG/2ML IJ SOLN: 50.0000 ug | Freq: Once | INTRAMUSCULAR | Status: DC

## 2015-02-20 MED ORDER — MIDAZOLAM BOLUS VIA INFUSION
1.0000 mg | INTRAVENOUS | Status: DC | PRN
Start: 2015-02-20 — End: 2015-02-26
  Administered 2015-02-21: 2 mg via INTRAVENOUS
  Filled 2015-02-20 (×2): qty 2

## 2015-02-20 MED ORDER — DOCUSATE SODIUM 50 MG/5ML PO LIQD: 100.0000 mg | Freq: Two times a day (BID) | ORAL | Status: DC | PRN

## 2015-02-20 MED ORDER — SODIUM CHLORIDE 0.9 % IV SOLN
25.0000 ug/h | INTRAVENOUS | Status: DC
  Administered 2015-02-20 – 2015-02-21 (×4): 400 ug/h via INTRAVENOUS
  Filled 2015-02-20 (×5): qty 50

## 2015-02-20 MED ORDER — INSULIN ASPART 100 UNIT/ML ~~LOC~~ SOLN
5.0000 [IU] | SUBCUTANEOUS | Status: DC
  Administered 2015-02-20 – 2015-02-22 (×12): 5 [IU] via SUBCUTANEOUS

## 2015-02-20 NOTE — Procedures (Signed)
Central Venous Catheter Insertion Procedure Note Derek Anthony [id number] 01-Feb-1956  Procedure: Insertion of Central Venous Catheter Indications: Assessment of intravascular volume, Drug and/or fluid administration and Frequent blood sampling  Procedure Details Consent: Risks of procedure as well as the alternatives and risks of each were explained to the (patient/caregiver).  Consent for procedure obtained. Time Out: Verified patient identification, verified procedure, site/side was marked, verified correct patient position, special equipment/implants available, medications/allergies/relevent history reviewed, required imaging and test results available.  Performed  Maximum sterile technique was used including antiseptics, cap, gloves, gown, hand hygiene, mask and sheet. Skin prep: Chlorhexidine; local anesthetic administered A antimicrobial bonded/coated triple lumen catheter was placed in the right internal jugular vein using the Seldinger technique.  Evaluation Blood flow good Complications: No apparent complications Patient did tolerate procedure well. Chest X-ray ordered to verify placement.  CXR: pending.  U/S used in placement.  YACOUB,WESAM 02/20/2015, 5:26 PM

## 2015-02-20 NOTE — Progress Notes (Signed)
PULMONARY / CRITICAL CARE MEDICINE   Name: Derek Anthony MRN: [id number] DOB: 03/20/1956    ADMISSION DATE:  02/18/2015 CONSULTATION DATE:  02/20/2015  REFERRING MD :  EDP  CHIEF COMPLAINT:  Dyspnea, respiratory distress  INITIAL PRESENTATION:  59 y.o. M with recent dx dermatomyositis, brought to [facility] ED 6/28 for worsening dyspnea and weakness.  Intubated in ED by PCCM and admitted to ICU.   STUDIES:  FOB 6/28 >>> BAL culture - neg, PCP - neg, AFB - neg, Cell Ct - 463 WBC, 65% mono, 27% pmn, 8% lymph, 0% eos, Cytology - neg for CA , Flow Cytometry - non diagnostic, BAL Fungal Culture - neg CXR 6/28 >>> worsening aeration with bilateral opacities.  Concerning for multifocal PNA vs edema.  SIGNIFICANT EVENTS: 6/14 through 6/23 - admitted. 6/28 - re-admitted with worsening hypoxic respiratory failure.   HISTORY OF PRESENT ILLNESS:  Pt is encephalopathic; therefore, this HPI is obtained from chart review. Derek Anthony is a 59 y.o. M with PMH as outlined below including recent diagnosis of dermatomyositis (diagnosed by [name] with rheumatology).  He is s/p 2 cycles of Methotrexate last being 6/13 and has since been on prednisone.He had recent admission for dyspnea and was treated with pulse dose steroids as well as empiric abx for CAP. He was discharged 02/13/15 with follow up scheduled with both pulmonology 6/28 ([name]) and rheumatology 6/30 ([name]).  On afternoon of 6/28, pt was profoundly more dyspneic and could not ambulate more than a few feet without having to stop and rest.  Per wife, he actually fell twice due to weakness and imbalance.  He has apparently felt bad since being discharged from the hospital and has progressively gotten worse.  He has had persistent cough productive of yellow sputum as well as generalized weakness and dyspnea with occasional wheezing.  He denies any fevers/chills/sweats, chest pain, hemoptysis, N/V/D, abdominal pain, myalgias, LE swelling /  pain.  Denies any recent travel.  Was immobilized for almost 48 hours this past weekend due to weakness and he basically slept all weekend long  On ED arrival, he was hypoxic with sats in the 50's on RA.  He was also tachypneic in the 40's with sinus tach, HR of 120's.  He was placed on BiPAP which pt subjectively felt helped his symptoms.  PCCM was called and asked to evaluate for admission.   SUBJECTIVE:   Remains on High FiO2 and PEEP More sedated today than 6/29  VITAL SIGNS: Temp:  [98 F (36.7 C)-99.4 F (37.4 C)] 98.9 F (37.2 C) (06/30 1125) Pulse Rate:  [76-102] 78 (06/30 1200) Resp:  [16-31] 16 (06/30 1200) BP: (87-117)/(57-72) 98/58 mmHg (06/30 1200) SpO2:  [89 %-98 %] 97 % (06/30 1200) FiO2 (%):  [80 %-100 %] 100 % (06/30 1128) Weight:  [103.4 kg (227 lb 15.3 oz)] 103.4 kg (227 lb 15.3 oz) (06/30 0419) HEMODYNAMICS:   VENTILATOR SETTINGS: Vent Mode:  [-] PRVC FiO2 (%):  [80 %-100 %] 100 % Set Rate:  [20 bmp] 20 bmp Vt Set:  [590 mL-640 mL] 640 mL PEEP:  [10 cmH20] 10 cmH20 Plateau Pressure:  [20 cmH20-27 cmH20] 27 cmH20 INTAKE / OUTPUT: Intake/Output      06/29 0701 - 06/30 0700 06/30 0701 - 07/01 0700   I.V. (mL/kg) 2116.8 (20.5) 294.2 (2.8)   NG/GT 635 310   IV Piggyback 660 250   Total Intake(mL/kg) 3411.8 (33) 854.2 (8.3)   Urine (mL/kg/hr) 2850 (1.1) 600 (1.1)  Total Output 2850 600   Net +561.8 +254.2          PHYSICAL EXAMINATION: General: WDWN male, intubated no distress Neuro: A&O x 3, non-focal. Quite awake given on MV HEENT: [place]/AT. PERRL, sclerae anicteric. Cardiovascular: Tachy, regular, no M/R/G.  Lungs: Respirations even and unlabored.  Faint rhonchi bilateral bases. Abdomen: BS x 4, soft, NT/ND.  Musculoskeletal: No gross deformities, no edema.  Skin: Intact, warm.  Gottron papules noted on R hand.  Bilateral mechanics hands noted.  LABS:  CBC  Recent Labs Lab 02/18/15 1615 02/19/15 0224 02/20/15 0225  WBC 22.6* 14.6* 14.4*   HGB 10.9* 8.8* 8.9*  HCT 35.2* 28.9* 28.7*  PLT 320 261 272   Coag's No results for input(s): APTT, INR in the last 168 hours. BMET  Recent Labs Lab 02/18/15 1615 02/19/15 0224 02/20/15 0225  NA 142 144 145  K 4.8 5.1 4.8  CL 108 115* 114*  CO2 21* 22 23  BUN 26* 26* 28*  CREATININE 1.74* 1.47* 1.25*  GLUCOSE 171* 138* 282*   Electrolytes  Recent Labs Lab 02/18/15 1615 02/19/15 0224 02/19/15 1035 02/19/15 2356 02/20/15 0225  CALCIUM 7.9* 7.0*  --   --  7.7*  MG  --   --  2.3 2.6*  --   PHOS  --   --  5.6* 4.4  --    Sepsis Markers  Recent Labs Lab 02/18/15 1723  LATICACIDVEN 4.03*   ABG  Recent Labs Lab 02/18/15 1653 02/18/15 2014  PHART 7.479* 7.208*  PCO2ART 32.6* 67.7*  PO2ART 110.0* 84.0   Liver Enzymes No results for input(s): AST, ALT, ALKPHOS, BILITOT, ALBUMIN in the last 168 hours. Cardiac Enzymes No results for input(s): TROPONINI, PROBNP in the last 168 hours. Glucose  Recent Labs Lab 02/19/15 1619 02/19/15 2049 02/19/15 2337 02/20/15 0314 02/20/15 0716 02/20/15 1123  GLUCAP 113* 188* 232* 259* 278* 335*    Imaging Dg Chest Port 1 View  02/20/2015   CLINICAL DATA:  Acute respiratory failure.  EXAM: PORTABLE CHEST - 1 VIEW  COMPARISON:  02/18/2015.  FINDINGS: The patient is rotated. Endotracheal tube 3.2 cm above the carina. Diffuse airspace opacities with cardiomegaly consistent with pulmonary edema or BILATERAL pneumonia, slightly improved. Overlying telemetry leads. Gastric tube is stable.  IMPRESSION: Slight improvement aeration.  Tubes and lines satisfactory.   Electronically Signed   By: Elsie Stain M.D.   On: 02/20/2015 07:25    ASSESSMENT / PLAN:  PULMONARY OETT 6/28 >>> A: Acute hypoxic respiratory failure in setting worsening bilateral airspace disease. Probable HCAP given hx and CXR findings + being on chronic prednisone. Baseline dermatomyositis - s/p 2 cycles methotrexate and on daily prednisone. Consider new  infiltrates in setting mtx, but clinical picture more consistent with progression of his underlying inflammatory process vs HCAP.  P:   Full mechanical support, wean PEEP and FiO2 as able. Will change to ARDS protocol and 6cc/kg on 6/30 Bronch for BAL cultures are pending VAP bundle. Hold SBT for next 2 - 3 days to facilitate pulmonary rest / recovery. Abx per ID section. Solumedrol ordered, Levalbuterol PRN. Follow CXR If no improvement over next 2 - 3 days despite abx therapy, then will need to consider FOB for organisms, or VATS with biopsy vs high dose anti-inflammatory regimen (steroids, cytoxan)  CARDIOVASCULAR A:  Sinus tach. Hx HTN. P:  Lopressor PRN. Hold outpatient olmesartan.  RENAL A:   AKI, improved am 6/29 ? Hypocalcemia - no albumin to correct for.  P:   NS to KVO on 6/30 BMP in AM.  GASTROINTESTINAL A:   GERD. Nutrition. P:   SUP: Famotidine. TF's  HEMATOLOGIC A:   Chronic anemia. VTE Prophylaxis. P:  SCD's / Heparin. Follow CBC  INFECTIOUS A:   Probable HCAP given hx and CXR findings + being on chronic prednisone. P:   BCx2 6/28 > BAL 6/28 > RVP 6/29 >>  U. Strep 6/28 > negative U. Legionella 6/28 > PCP smear 6/28 > Abx: Vanc, start date 6/28 >>  Abx: Ceftaz, start date 6/28 >>   Will need to consider opportunistic organisms, consider adding bactrim for PCP depending on course.  BAL still pending  ENDOCRINE A:   DM. Hyperglycemic with the addition of TF P:   SSI. Add TF coverage   NEUROLOGIC A:   Acute encephalopathy due to sedation. P:   Sedation:  Propofol gtt / Fentanyl. RASS goal: 0 to -1. Daily WUA. Hold outpatient gabapentin, norco.  RHEUMATOLOGIC A: Baseline dermatomyositis - s/p 2 cycles methotrexate and on daily prednisone. P: Solumedrol. Will follow up with Dr. Nickola MajorHawkes at outpatient, will likely require immunotherapy (cellcept, etc).   Family updated: Wife and sons at bedside 6/29  Interdisciplinary Family  Meeting v Palliative Care Meeting:  Due by: 02/24/15.  Independent CC time 35 minutes  Levy Pupaobert Byrum, MD, PhD 02/20/2015, 12:28 PM [facility] [phone] or if no answer [phone]

## 2015-02-20 NOTE — Progress Notes (Addendum)
eLink Physician-Brief Progress Note Patient Name: Derek Anthony DOB: 08-03-1956 MRN: [id number]  Date of Service  02/20/2015   HPI/Events of Note   Severe ARDS on ABG. GEtting hypotensivw with SBP 80s  Recent Labs Lab 02/18/15 1653 02/18/15 2014 02/20/15 1254 02/20/15 1425 02/20/15 1528  PHART 7.479* 7.208* 7.320* 7.321* 7.287*  PCO2ART 32.6* 67.7* 41.7 41.1 43.7  PO2ART 110.0* 84.0 69.0* 65.0* 58.0*  HCO3 24.2* 27.0* 21.4 21.2 20.8  TCO2 25 29 23 22 22   O2SAT 99.0 93.0 92.0 90.0 86.0     eICU Interventions  Dc diprivan Start neo Start fent and versed gtt -> RASS goal -4 Consider NMB niimbex if needed - > not a candidate for ROSE PETAL  TRIAL    Intervention Category Intermediate Interventions: Hypotension - evaluation and management  Onia Shiflett 02/20/2015, 4:43 PM

## 2015-02-20 NOTE — Progress Notes (Signed)
eLink Physician-Brief Progress Note Patient Name: Jerline PainDurwood A Longmore DOB: [birth year] MRN: [id number]   Date of Service  02/20/2015  HPI/Events of Note  ./trabg  Recent Labs Lab 02/18/15 2014 02/20/15 1254 02/20/15 1425 02/20/15 1528 02/20/15 1645  PHART 7.208* 7.320* 7.321* 7.287* 7.289*  PCO2ART 67.7* 41.7 41.1 43.7 44.7  PO2ART 84.0 69.0* 65.0* 58.0* 79.0*  HCO3 27.0* 21.4 21.2 20.8 21.3  TCO2 29 23 22 22 23   O2SAT 93.0 92.0 90.0 86.0 94.0     eICU Interventions  Repeat abg to reasses      Intervention Category Major Interventions: Acid-Base disturbance - evaluation and management  Harshaan Whang 02/20/2015, 10:58 PM

## 2015-02-20 NOTE — Procedures (Signed)
Arterial Catheter Insertion Procedure Note Derek Anthony [name] [id number] Aug 17, 1956  Procedure: Insertion of Arterial Catheter  Indications: Blood pressure monitoring and Frequent blood sampling  Procedure Details Consent: Risks of procedure as well as the alternatives and risks of each were explained to the (patient/caregiver).  Consent for procedure obtained. Time Out: Verified patient identification, verified procedure, site/side was marked, verified correct patient position, special equipment/implants available, medications/allergies/relevent history reviewed, required imaging and test results available.  Performed  Maximum sterile technique was used including antiseptics, cap, gloves, gown, hand hygiene, mask and sheet. Skin prep: Chlorhexidine; local anesthetic administered 20 gauge catheter was inserted into right radial artery using the Seldinger technique.  Evaluation Blood flow good; BP tracing good. Complications: No apparent complications.   Derek Anthony,Derek Anthony 02/20/2015

## 2015-02-20 NOTE — Progress Notes (Signed)
RT called to room due to decrease in SpO2 and increased agitation. Per RN pt had been moved around during bathing and x-ray. Pt suctioned with minimal secretions. Increased fio2 to 100%, BBS- Diminished with slight expiratory wheeze, Tx-given with no improvement. With increased sedation and time pt calmed down. SpO2 WNL.RT will continue to monitor.

## 2015-02-20 NOTE — Progress Notes (Signed)
Inpatient Diabetes Program Recommendations  AACE/ADA: New Consensus Statement on Inpatient Glycemic Control (2013)  Target Ranges:  Prepandial:   less than 140 mg/dL      Peak postprandial:   less than 180 mg/dL (1-2 hours)      Critically ill patients:  140 - 180 mg/dL   Results for Derek Anthony, Derek Anthony (MRN [id number]) as of 02/20/2015 08:32  Ref. Range 02/19/2015 08:22 02/19/2015 12:03 02/19/2015 16:19 02/19/2015 20:49 02/19/2015 23:37 02/20/2015 03:14 02/20/2015 07:16  Glucose-Capillary Latest Ref Range: 65-99 mg/dL 213144 (H) 086122 (H) 578113 (H) 188 (H) 232 (H) 259 (H) 278 (H)    Diabetes history: DM2 Outpatient Diabetes medications: Lantus 42 units BID, 70/30 40 units BID Current orders for Inpatient glycemic control: Novolog 0-15 units Q4H  Inpatient Diabetes Program Recommendations Insulin - Basal: Please consider ordering low dose basal insulin. Recommend starting with Lantus 10 units Q24H (based on 103 kg x 0.1 units). Correction (SSI): Please consider changing to ICU Glycemic Control order set to improve inpatient glycemic control. Insulin - Meal Coverage: Please consider ordeirng Novolog 3 units Q4H for tube feeding coverage. HgbA1C: Please consider ordering an A1C to evaluate glycemic control over the past 2-3 months.  Thanks, [name], RN, MSN, CCRN, CDE Diabetes Coordinator Inpatient Diabetes Program [phone] (Team Pager from 8am to 5pm) [phone] ([facility] office) [phone] [facility] office) [phone] [facility] office)

## 2015-02-20 NOTE — Progress Notes (Addendum)
eLink Physician-Brief Progress Note Patient Name: Derek Anthony A Law DOB: [birth year] MRN: [id number]   Date of Service  02/20/2015  HPI/Events of Note  cxr post cvl  Dg Chest Port 1 View  02/20/2015   CLINICAL DATA:  Status post central line placement  EXAM: PORTABLE CHEST - 1 VIEW  COMPARISON:  02/20/2015  FINDINGS: Endotracheal tube and nasogastric catheter are again identified and stable. A new right jugular central line is noted with the catheter tip in the superior aspect of the right atrium. This could be withdrawn 1 cm as clinically indicated. No pneumothorax is seen. Patchy airspace opacities are again identified and slightly improved when compared with the prior exam. This may be related to improved aeration.  IMPRESSION: No pneumothorax following central line placement.  Tubes and lines as described above.  Improved aeration bilaterally.   Electronically Signed   By: Alcide CleverMark  Lukens M.D.   On: 02/20/2015 18:13   Dg Chest Port 1 View  02/20/2015   CLINICAL DATA:  Acute respiratory failure.  EXAM: PORTABLE CHEST - 1 VIEW  COMPARISON:  02/18/2015.  FINDINGS: The patient is rotated. Endotracheal tube 3.2 cm above the carina. Diffuse airspace opacities with cardiomegaly consistent with pulmonary edema or BILATERAL pneumonia, slightly improved. Overlying telemetry leads. Gastric tube is stable.  IMPRESSION: Slight improvement aeration.  Tubes and lines satisfactory.   Electronically Signed   By: Elsie StainJohn T Curnes M.D.   On: 02/20/2015 07:25     eICU Interventions  Ok to use cvl     Intervention Category Intermediate Interventions: Diagnostic test evaluation  Jehieli Brassell 02/20/2015, 6:22 PM

## 2015-02-20 NOTE — Clinical Documentation Improvement (Signed)
Supporting Information: He was fluid resuscitated for Severe Sepsis and given vancomycin and a cephalosporin as he has a noted allergy to penicillins per 6/28 ED note.  Please document Sepsis in the medical record if you agree that patient has a diagnosis of Sepsis, as it is only documented in the ED note.  Lab:  6/28: lactic acid: 4.03.   Possible Clinical Condition: . Bacteremia (positive blood cultures only) . Sepsis with UTI, versus UTI only . Sepsis-specify causative organism if known . Sepsis due to: --Device --Implant --Graft --Infusion . Severe sepsis-sepsis with organ dysfunction --Specify organ dysfunction Respiratory failure Encephalopathy Acute kidney failure Other (specify) . SIRS (Systemic Inflammatory Response Syndrome --With or without organ dysfunction . Document septic shock if present . Document any associated diagnoses/conditions    Thank Gabriel CirriYou,  Ozella Comins Mathews-Bethea,RN,BSN,Clinical Documentation Specialist [phone] Majed [email or web address]

## 2015-02-21 ENCOUNTER — Inpatient Hospital Stay (HOSPITAL_COMMUNITY): Payer: 59

## 2015-02-21 DIAGNOSIS — J939 Pneumothorax, unspecified: Secondary | ICD-10-CM | POA: Insufficient documentation

## 2015-02-21 LAB — CULTURE, RESPIRATORY

## 2015-02-21 LAB — POCT I-STAT 3, ART BLOOD GAS (G3+)
ACID-BASE DEFICIT: 4 mmol/L — AB (ref 0.0–2.0)
Bicarbonate: 21.9 mEq/L (ref 20.0–24.0)
O2 Saturation: 91 %
PCO2 ART: 43.3 mmHg (ref 35.0–45.0)
PH ART: 7.313 — AB (ref 7.350–7.450)
Patient temperature: 99.2
TCO2: 23 mmol/L (ref 0–100)
pO2, Arterial: 68 mmHg — ABNORMAL LOW (ref 80.0–100.0)

## 2015-02-21 LAB — GLUCOSE, CAPILLARY
GLUCOSE-CAPILLARY: 177 mg/dL — AB (ref 65–99)
GLUCOSE-CAPILLARY: 148 mg/dL — AB (ref 65–99)
GLUCOSE-CAPILLARY: 183 mg/dL — AB (ref 65–99)
Glucose-Capillary: 135 mg/dL — ABNORMAL HIGH (ref 65–99)
Glucose-Capillary: 116 mg/dL — ABNORMAL HIGH (ref 65–99)
Glucose-Capillary: 133 mg/dL — ABNORMAL HIGH (ref 65–99)

## 2015-02-21 LAB — MAGNESIUM
MAGNESIUM: 2.8 mg/dL — AB (ref 1.7–2.4)
Magnesium: 2.8 mg/dL — ABNORMAL HIGH (ref 1.7–2.4)

## 2015-02-21 LAB — PHOSPHORUS
Phosphorus: 4.1 mg/dL (ref 2.5–4.6)
Phosphorus: 4 mg/dL (ref 2.5–4.6)

## 2015-02-21 LAB — VANCOMYCIN, TROUGH: Vancomycin Tr: 17 ug/mL (ref 10.0–20.0)

## 2015-02-21 MED ORDER — MIDAZOLAM BOLUS VIA INFUSION
2.0000 mg | INTRAVENOUS | Status: DC | PRN
  Filled 2015-02-21: qty 2

## 2015-02-21 MED ORDER — FENTANYL BOLUS VIA INFUSION
50.0000 ug | INTRAVENOUS | Status: DC | PRN
  Administered 2015-02-26 – 2015-02-27 (×6): 50 ug via INTRAVENOUS
  Filled 2015-02-21: qty 50

## 2015-02-21 MED ORDER — SODIUM CHLORIDE 0.9 % IV SOLN
3.0000 ug/kg/min | INTRAVENOUS | Status: DC
  Administered 2015-02-22 (×2): 3 ug/kg/min via INTRAVENOUS
  Administered 2015-02-23: 1.5 ug/kg/min via INTRAVENOUS
  Administered 2015-02-24: 2 ug/kg/min via INTRAVENOUS
  Filled 2015-02-21 (×4): qty 20

## 2015-02-21 MED ORDER — CISATRACURIUM BOLUS VIA INFUSION
2.5000 mg | Freq: Once | INTRAVENOUS | Status: AC
  Administered 2015-02-22: 2.5 mg via INTRAVENOUS
  Filled 2015-02-21: qty 3

## 2015-02-21 MED ORDER — MIDAZOLAM HCL 5 MG/ML IJ SOLN
1.0000 mg/h | INTRAMUSCULAR | Status: DC
  Administered 2015-02-22 – 2015-02-25 (×10): 10 mg/h via INTRAVENOUS
  Administered 2015-02-25: 6 mg/h via INTRAVENOUS
  Administered 2015-02-26 – 2015-02-28 (×10): 10 mg/h via INTRAVENOUS
  Filled 2015-02-21 (×27): qty 10

## 2015-02-21 MED ORDER — FENTANYL CITRATE (PF) 100 MCG/2ML IJ SOLN: 100.0000 ug | Freq: Once | INTRAMUSCULAR | Status: AC | PRN

## 2015-02-21 MED ORDER — SODIUM CHLORIDE 0.9 % IV SOLN
25.0000 ug/h | INTRAVENOUS | Status: DC
  Administered 2015-02-22: 400 ug/h via INTRAVENOUS
  Administered 2015-02-22: 300 ug/h via INTRAVENOUS
  Administered 2015-02-22 – 2015-03-01 (×24): 400 ug/h via INTRAVENOUS
  Filled 2015-02-21 (×28): qty 50

## 2015-02-21 MED ORDER — FENTANYL CITRATE (PF) 100 MCG/2ML IJ SOLN
100.0000 ug | Freq: Once | INTRAMUSCULAR | Status: AC
  Administered 2015-02-22: 100 ug via INTRAVENOUS

## 2015-02-21 MED ORDER — MIDAZOLAM HCL 2 MG/2ML IJ SOLN: 2.0000 mg | Freq: Once | INTRAMUSCULAR | Status: AC | PRN

## 2015-02-21 MED ORDER — PHENYLEPHRINE HCL 10 MG/ML IJ SOLN
30.0000 ug/min | INTRAVENOUS | Status: DC
  Administered 2015-02-22: 30 ug/min via INTRAVENOUS
  Filled 2015-02-21: qty 4

## 2015-02-21 MED ORDER — ARTIFICIAL TEARS OP OINT
1.0000 "application " | TOPICAL_OINTMENT | Freq: Three times a day (TID) | OPHTHALMIC | Status: DC
  Administered 2015-02-22 – 2015-03-01 (×23): 1 via OPHTHALMIC
  Filled 2015-02-21 (×2): qty 3.5

## 2015-02-21 MED ORDER — MIDAZOLAM HCL 2 MG/2ML IJ SOLN: 2.0000 mg | Freq: Once | INTRAMUSCULAR | Status: DC

## 2015-02-21 NOTE — Procedures (Signed)
Chest Tube Insertion Procedure Note  Indications:  Clinically significant Pneumothorax  Pre-operative Diagnosis: Pneumothorax  Post-operative Diagnosis: Pneumothorax  Procedure Details  Informed consent was obtained for the procedure, including sedation.  Risks of lung perforation, hemorrhage, arrhythmia, and adverse drug reaction were discussed.   After sterile skin prep, using standard technique, a 12 French tube was placed in the left lateral 6 rib space.  Findings: None  Estimated Blood Loss:  Minimal         Specimens:  None              Complications:  None; patient tolerated the procedure well.         Disposition: ICU - intubated and critically ill.         Condition: stable  Levy Pupaobert Loranzo Desha, MD, PhD 02/21/2015, 4:22 PM [facility] [phone] or if no answer [phone]

## 2015-02-21 NOTE — Progress Notes (Signed)
PULMONARY / CRITICAL CARE MEDICINE   Name: Derek Anthony MRN: [id number] DOB: August 07, 1956    ADMISSION DATE:  02/18/2015 CONSULTATION DATE:  02/21/2015  REFERRING MD :  EDP  CHIEF COMPLAINT:  Dyspnea, respiratory distress  INITIAL PRESENTATION:  59 y.o. M with recent dx dermatomyositis, brought to [facility] ED 6/28 for worsening dyspnea and weakness.  Intubated in ED by PCCM and admitted to ICU.   STUDIES:  FOB 6/28 >>> BAL culture - neg, PCP - neg, AFB - neg, Cell Ct - 463 WBC, 65% mono, 27% pmn, 8% lymph, 0% eos, Cytology - neg for CA , Flow Cytometry - non diagnostic, BAL Fungal Culture - neg CXR 6/28 >>> worsening aeration with bilateral opacities.  Concerning for multifocal PNA vs edema.  SIGNIFICANT EVENTS: 6/14 through 6/23 - admitted. 6/28 - re-admitted with worsening hypoxic respiratory failure. 7/1 B chest tubes placed   HISTORY OF PRESENT ILLNESS:  Pt is encephalopathic; therefore, this HPI is obtained from chart review. Derek Anthony is a 59 y.o. M with PMH as outlined below including recent diagnosis of dermatomyositis (diagnosed by [name] with rheumatology).  He is s/p 2 cycles of Methotrexate last being 6/13 and has since been on prednisone.He had recent admission for dyspnea and was treated with pulse dose steroids as well as empiric abx for CAP. He was discharged 02/13/15 with follow up scheduled with both pulmonology 6/28 ([name]) and rheumatology 6/30 ([name]).  On afternoon of 6/28, pt was profoundly more dyspneic and could not ambulate more than a few feet without having to stop and rest.  Per wife, he actually fell twice due to weakness and imbalance.  He has apparently felt bad since being discharged from the hospital and has progressively gotten worse.  He has had persistent cough productive of yellow sputum as well as generalized weakness and dyspnea with occasional wheezing.  He denies any fevers/chills/sweats, chest pain, hemoptysis, N/V/D, abdominal pain,  myalgias, LE swelling / pain.  Denies any recent travel.  Was immobilized for almost 48 hours this past weekend due to weakness and he basically slept all weekend long  On ED arrival, he was hypoxic with sats in the 50's on RA.  He was also tachypneic in the 40's with sinus tach, HR of 120's.  He was placed on BiPAP which pt subjectively felt helped his symptoms.  PCCM was called and asked to evaluate for admission.   SUBJECTIVE:   Remains on High FiO2 and PEEP, haven't seen much improvement yet in infiltrates or resp status Note L PTX on CXR, subcut air R neck  VITAL SIGNS: Temp:  [99.1 F (37.3 C)-99.3 F (37.4 C)] 99.3 F (37.4 C) (07/01 1123) Pulse Rate:  [72-98] 80 (07/01 1400) Resp:  [14-35] 30 (07/01 1400) BP: (81-119)/(46-72) 115/57 mmHg (07/01 1522) SpO2:  [86 %-98 %] 98 % (07/01 1400) Arterial Line BP: (102-127)/(44-62) 118/60 mmHg (07/01 1400) FiO2 (%):  [70 %-80 %] 70 % (07/01 1522) Weight:  [105.3 kg (232 lb 2.3 oz)] 105.3 kg (232 lb 2.3 oz) (07/01 1300) HEMODYNAMICS:   VENTILATOR SETTINGS: Vent Mode:  [-] PRVC FiO2 (%):  [70 %-80 %] 70 % Set Rate:  [35 bmp] 35 bmp Vt Set:  [480 mL] 480 mL PEEP:  [10 cmH20-14 cmH20] 10 cmH20 Plateau Pressure:  [18 cmH20-34 cmH20] 18 cmH20 INTAKE / OUTPUT: Intake/Output      06/30 0701 - 07/01 0700 07/01 0701 - 07/02 0700   I.V. (mL/kg) 2038.5 (19.7) 653 (6.2)  NG/GT 1660    IV Piggyback 550 250   Total Intake(mL/kg) 4248.5 (41.1) 903 (8.6)   Urine (mL/kg/hr) 1995 (0.8) 1200 (1.3)   Emesis/NG output  150 (0.2)   Total Output 1995 1350   Net +2253.5 -447          PHYSICAL EXAMINATION: General: WDWN male, intubated no distress Neuro: A&O x 3, non-focal. Quite awake given on MV HEENT: [place]/AT. PERRL, sclerae anicteric. R neck with some tracking subcut air Cardiovascular: Tachy, regular, no M/R/G.  Lungs: Respirations even and unlabored.  Faint rhonchi bilateral bases. Abdomen: BS x 4, soft, NT/ND.  Musculoskeletal: No  gross deformities, no edema.  Skin: Intact, warm.  Gottron papules noted on R hand.  Bilateral mechanics hands noted.  LABS:  CBC  Recent Labs Lab 02/18/15 1615 02/19/15 0224 02/20/15 0225  WBC 22.6* 14.6* 14.4*  HGB 10.9* 8.8* 8.9*  HCT 35.2* 28.9* 28.7*  PLT 320 261 272   Coag's No results for input(s): APTT, INR in the last 168 hours. BMET  Recent Labs Lab 02/18/15 1615 02/19/15 0224 02/20/15 0225  NA 142 144 145  K 4.8 5.1 4.8  CL 108 115* 114*  CO2 21* 22 23  BUN 26* 26* 28*  CREATININE 1.74* 1.47* 1.25*  GLUCOSE 171* 138* 282*   Electrolytes  Recent Labs Lab 02/18/15 1615 02/19/15 0224  02/20/15 0225 02/20/15 1200 02/20/15 2310 02/21/15 1104  CALCIUM 7.9* 7.0*  --  7.7*  --   --   --   MG  --   --   < >  --  2.8* 2.8* 2.8*  PHOS  --   --   < >  --  4.3 4.1 4.0  < > = values in this interval not displayed. Sepsis Markers  Recent Labs Lab 02/18/15 1723  LATICACIDVEN 4.03*   ABG  Recent Labs Lab 02/20/15 1645 02/20/15 2308 02/21/15 0435  PHART 7.289* 7.326* 7.313*  PCO2ART 44.7 42.8 43.3  PO2ART 79.0* 81.0 68.0*   Liver Enzymes No results for input(s): AST, ALT, ALKPHOS, BILITOT, ALBUMIN in the last 168 hours. Cardiac Enzymes No results for input(s): TROPONINI, PROBNP in the last 168 hours. Glucose  Recent Labs Lab 02/20/15 1635 02/20/15 1937 02/20/15 2305 02/21/15 0408 02/21/15 0737 02/21/15 1122  GLUCAP 324* 272* 237* 177* 135* 116*    Imaging Dg Chest Port 1 View  02/21/2015   CLINICAL DATA:  Acute respiratory failure  EXAM: PORTABLE CHEST - 1 VIEW  COMPARISON:  02/21/2015 at 0818 hours  FINDINGS: Endotracheal tube terminates 5 cm above the carina.  Tiny left apical pneumothorax. Small right apical pneumothorax. Suspected pneumomediastinum.  Overlying subcutaneous emphysema in the right greater than left upper chest.  Mild bibasilar atelectasis.  Mild cardiomegaly.  Right IJ venous catheter terminates at the cavoatrial  junction.  Enteric tube courses into the proximal duodenum.  IMPRESSION: Tiny left apical pneumothorax. Small right apical pneumothorax. Suspected pneumomediastinum. Associated subcutaneous emphysema.  These results were called by telephone at the time of interpretation on 02/21/2015 at 3:07 pm to Dr. Levy Pupa, who verbally acknowledged these results.   Electronically Signed   By: Charline Bills M.D.   On: 02/21/2015 15:09   Dg Chest Port 1 View  02/21/2015   CLINICAL DATA:  Right IJ swelling post central line placement.  EXAM: PORTABLE CHEST - 1 VIEW  COMPARISON:  02/19/2001)  FINDINGS: 0818 hr. The right IJ central venous catheter appears unchanged, projecting to the SVC right atrial level. Nasogastric tube  projects below the diaphragm, tip not visualized. Endotracheal tube tip is in the mid trachea. The heart size and mediastinal contours are stable. There are stable patchy perihilar airspace opacities bilaterally. There is fairly extensive new soft tissue emphysema, greatest in the lower right neck. No right-sided pneumothorax or definite pneumomediastinum identified. There is a small left apical pneumothorax (less than 10%).  IMPRESSION: New soft tissue emphysema with greatest component in the lower right neck. No right-sided pneumothorax. Small left apical pneumothorax.  Support system appears unchanged.  These results were called by telephone at the time of interpretation on 02/21/2015 at 8:42 am to patient's nurse Acey LavPhyllis McKinney, who verbally acknowledged these results.   Electronically Signed   By: Carey BullocksWilliam  Veazey M.D.   On: 02/21/2015 08:43   Dg Chest Port 1 View  02/20/2015   CLINICAL DATA:  Status post central line placement  EXAM: PORTABLE CHEST - 1 VIEW  COMPARISON:  02/20/2015  FINDINGS: Endotracheal tube and nasogastric catheter are again identified and stable. A new right jugular central line is noted with the catheter tip in the superior aspect of the right atrium. This could be withdrawn  1 cm as clinically indicated. No pneumothorax is seen. Patchy airspace opacities are again identified and slightly improved when compared with the prior exam. This may be related to improved aeration.  IMPRESSION: No pneumothorax following central line placement.  Tubes and lines as described above.  Improved aeration bilaterally.   Electronically Signed   By: Alcide CleverMark  Lukens M.D.   On: 02/20/2015 18:13   Dg Abd Portable 1v  02/21/2015   CLINICAL DATA:  Feeding tube placement  EXAM: PORTABLE ABDOMEN - 1 VIEW  COMPARISON:  None.  FINDINGS: NG tube extends through the body of the stomach and likely resides in the proximal duodenum versus distal antrum of the stomach. Bibasilar atelectasis. Linear gas paralleling the medial left hemidiaphragm.  IMPRESSION: A feeding tube with tip within the gastric antrum versus duodenum.  Bibasilar atelectasis. Potential pneumomediastinum with linear gas paralleling the left hemidiaphragm inferior to the heart.   Electronically Signed   By: Genevive BiStewart  Edmunds M.D.   On: 02/21/2015 08:39    ASSESSMENT / PLAN:  PULMONARY OETT 6/28 >>> A: Acute hypoxic respiratory failure in setting worsening bilateral airspace disease. Probable HCAP given hx and CXR findings + being on chronic prednisone. Baseline dermatomyositis - s/p 2 cycles methotrexate and on daily prednisone. Consider new infiltrates in setting mtx, but clinical picture more consistent with progression of his underlying inflammatory process vs HCAP.  Pneumomediastinum, B apical PTX's P:   Full mechanical support, wean PEEP and FiO2 as able. Changed to ARDS protocol and 6cc/kg on 6/30 Bronch for BAL cultures are negative so far VAP bundle. Abx per ID section. Solumedrol ordered, Levalbuterol PRN. Follow CXR If no improvement over next 24h despite abx therapy, then will need to consider VATS with biopsy vs high dose anti-inflammatory regimen (steroids, cytoxan). Not clear to me that he would tolerate VATS well at  this time.  Will place B pigtail chest tubes on 7/1 to suction  CARDIOVASCULAR A:  Sinus tach. Hx HTN. P:  Lopressor PRN. Hold outpatient olmesartan.  RENAL A:   AKI, improved am 6/29 ? Hypocalcemia - no albumin to correct for. P:   NS to KVO on 6/30 BMP in AM.  GASTROINTESTINAL A:   GERD. Nutrition. P:   SUP: Famotidine. TF's  HEMATOLOGIC A:   Chronic anemia. VTE Prophylaxis. P:  SCD's / Heparin. Follow CBC  INFECTIOUS A:   Probable HCAP given hx and CXR findings + being on chronic prednisone. P:   BCx2 6/28 > BAL 6/28 > normal flora RVP 6/29 >> negative U. Strep 6/28 > negative U. Legionella 6/28 > negative PCP smear 6/28 > negative Abx: Vanc, start date 6/28 >>  Abx: Ceftaz, start date 6/28 >>   Will need to consider opportunistic organisms, but note all cx data negative.   ENDOCRINE A:   DM. Hyperglycemic with the addition of TF P:   SSI. Add TF coverage   NEUROLOGIC A:   Acute encephalopathy due to sedation. P:   Sedation:  Propofol gtt / Fentanyl. RASS goal: 0 to -1. Daily WUA. Hold outpatient gabapentin, norco.  RHEUMATOLOGIC A: Baseline dermatomyositis - s/p 2 cycles methotrexate and on daily prednisone. P: Solumedrol. Will follow up with [name] at outpatient, will likely require immunotherapy (cellcept, etc).   Family updated: Wife and sons at bedside 6/29, wife on 7/1  Interdisciplinary Family Meeting v Palliative Care Meeting:  Due by: 02/24/15.  Independent CC time 45 minutes  Levy Pupa, MD, PhD 02/21/2015, 3:45 PM [facility] [phone] or if no answer [phone]

## 2015-02-21 NOTE — Procedures (Signed)
Chest Tube Insertion Procedure Note  Indications:  Clinically significant Pneumothorax  Pre-operative Diagnosis: Pneumothorax  Post-operative Diagnosis: Pneumothorax  Procedure Details  Informed consent was obtained for the procedure, including sedation.  Risks of lung perforation, hemorrhage, arrhythmia, and adverse drug reaction were discussed.   After sterile skin prep, using standard technique, a 12 French tube was placed in the left lateral 6 rib space.  Findings: None  Estimated Blood Loss:  Minimal         Specimens:  None              Complications:  None; patient tolerated the procedure well.         Disposition: ICU - intubated and critically ill.         Condition: stable  Levy Pupaobert Madalee Altmann, MD, PhD 02/21/2015, 4:21 PM [facility] [phone] or if no answer [phone]

## 2015-02-21 NOTE — Progress Notes (Signed)
ANTIBIOTIC CONSULT NOTE - INITIAL  Pharmacy Consult for vancomycin and ceftazidime Indication: pneumonia  Allergies  Allergen Reactions  . Sulfa Antibiotics Itching and Rash    Burning also  . Penicillins Itching and Rash    Patient Measurements: Height: 6\' 1"  (185.4 cm) Weight: 227 lb 15.3 oz (103.4 kg) IBW/kg (Calculated) : 79.9  Vital Signs: Temp: 99.3 F (37.4 C) (07/01 0741) Temp Source: Oral (07/01 0741) BP: 105/61 mmHg (07/01 1000) Pulse Rate: 88 (07/01 1000) Intake/Output from previous day: 06/30 0701 - 07/01 0700 In: 4248.5 [I.V.:2038.5; NG/GT:1660; IV Piggyback:550] Out: 1995 [Urine:1995] Intake/Output from this shift: Total I/O In: 358 [I.V.:258; IV Piggyback:100] Out: 475 [Urine:475]  Labs:  Recent Labs  02/18/15 1615 02/18/15 2125 02/19/15 0224 02/20/15 0225  WBC 22.6*  --  14.6* 14.4*  HGB 10.9*  --  8.8* 8.9*  PLT 320  --  261 272  [facility]  --  111.66  --   --   CREATININE 1.74*  --  1.47* 1.25*   Estimated Creatinine Clearance: 78.4 mL/min (by C-G formula based on Cr of 1.25).  Recent Labs  02/21/15 0947  [facility] 17     Microbiology: Recent Results (from the past 720 hour(s))  Culture, blood (routine x 2)     Status: None   Collection Time: 02/04/15  9:00 PM  Result Value Ref Range Status   Specimen Description BLOOD RIGHT ARM  Final   Special Requests BOTTLES DRAWN AEROBIC AND ANAEROBIC 5CC EA  Final   Culture   Final    NO GROWTH 5 DAYS Performed at [facility]    Report Status 02/11/2015 FINAL  Final  Culture, blood (routine x 2)     Status: None   Collection Time: 02/04/15  9:15 PM  Result Value Ref Range Status   Specimen Description BLOOD LEFT ARM  Final   Special Requests BOTTLES DRAWN AEROBIC AND ANAEROBIC 5CC EA  Final   Culture   Final    NO GROWTH 5 DAYS Performed at [facility]    Report Status 02/11/2015 FINAL  Final  Culture, sputum-assessment     Status: None   Collection Time: 02/06/15  11:27 AM  Result Value Ref Range Status   Specimen Description SPUTUM  Final   Special Requests NONE  Final   Sputum evaluation   Final    MICROSCOPIC FINDINGS SUGGEST THAT THIS SPECIMEN IS NOT REPRESENTATIVE OF LOWER RESPIRATORY SECRETIONS. PLEASE RECOLLECT. Results Called to: R L'ESPERANCE,RN AT 1205 02/06/15 BY L BENFIELD    Report Status 02/06/2015 FINAL  Final  Culture, respiratory (NON-Expectorated)     Status: None   Collection Time: 02/06/15 12:38 PM  Result Value Ref Range Status   Specimen Description BRONCHIAL ALVEOLAR LAVAGE  Final   Special Requests Immunocompromised  Final   Gram Stain   Final    RARE WBC PRESENT,BOTH PMN AND MONONUCLEAR NO SQUAMOUS EPITHELIAL CELLS SEEN NO ORGANISMS SEEN Performed at [facility]    Culture   Final    Non-Pathogenic Oropharyngeal-type Flora Isolated. Performed at [facility]    Report Status 02/09/2015 FINAL  Final  Fungus Culture with Smear     Status: None (Preliminary result)   Collection Time: 02/06/15 12:38 PM  Result Value Ref Range Status   Specimen Description BRONCHIAL ALVEOLAR LAVAGE  Final   Special Requests Immunocompromised  Final   Fungal Smear   Final    NO YEAST OR FUNGAL ELEMENTS SEEN Performed at [facility]  Culture   Final    CULTURE IN PROGRESS FOR FOUR WEEKS Performed at [facility]    Report Status PENDING  Incomplete  AFB culture with smear     Status: None (Preliminary result)   Collection Time: 02/06/15 12:38 PM  Result Value Ref Range Status   Specimen Description BRONCHIAL ALVEOLAR LAVAGE  Final   Special Requests Immunocompromised  Final   Acid Fast Smear   Final    NO ACID FAST BACILLI SEEN Performed at [facility]    Culture   Final    CULTURE WILL BE EXAMINED FOR 6 WEEKS BEFORE ISSUING A FINAL REPORT Performed at [facility]    Report Status PENDING  Incomplete  Pneumocystis smear by DFA     Status: None   Collection Time:  02/06/15 12:38 PM  Result Value Ref Range Status   Specimen Source-PJSRC BRONCHIAL ALVEOLAR LAVAGE  Final   Pneumocystis jiroveci Ag NEGATIVE  Final    Comment: Performed at [facility]  Blood culture (routine x 2)     Status: None (Preliminary result)   Collection Time: 02/18/15  5:00 PM  Result Value Ref Range Status   Specimen Description BLOOD RIGHT ARM  Final   Special Requests BOTTLES DRAWN AEROBIC AND ANAEROBIC 5CC  Final   Culture NO GROWTH 2 DAYS  Final   Report Status PENDING  Incomplete  Culture, blood (routine x 2)     Status: None (Preliminary result)   Collection Time: 02/18/15  5:39 PM  Result Value Ref Range Status   Specimen Description BLOOD RIGHT HAND  Final   Special Requests BOTTLES DRAWN AEROBIC ONLY 4CC  Final   Culture NO GROWTH 1 DAY  Final   Report Status PENDING  Incomplete  MRSA PCR Screening     Status: Abnormal   Collection Time: 02/18/15  8:45 PM  Result Value Ref Range Status   MRSA by PCR POSITIVE (A) NEGATIVE Final    Comment:        The GeneXpert MRSA Assay (FDA approved for NASAL specimens only), is one component of a comprehensive MRSA colonization surveillance program. It is not intended to diagnose MRSA infection nor to guide or monitor treatment for MRSA infections. RESULT CALLED TO, READ BACK BY AND VERIFIED WITH: SMITH,A RN [phone] 02/19/15 MITCHELL,L   Pneumocystis smear by DFA     Status: None   Collection Time: 02/18/15  9:15 PM  Result Value Ref Range Status   Specimen Source-[facility] BRONCHIAL ALVEOLAR LAVAGE  Final   Pneumocystis jiroveci Ag NEGATIVE  Final    Comment: Performed at [facility]  Culture, respiratory (NON-Expectorated)     Status: None   Collection Time: 02/18/15  9:15 PM  Result Value Ref Range Status   Specimen Description BRONCHIAL ALVEOLAR LAVAGE  Final   Special Requests NONE  Final   Gram Stain   Final    RARE WBC PRESENT, PREDOMINANTLY MONONUCLEAR RARE SQUAMOUS EPITHELIAL  CELLS PRESENT NO ORGANISMS SEEN Performed at [facility]    Culture   Final    Non-Pathogenic Oropharyngeal-type Flora Isolated. Performed at [facility]    Report Status 02/21/2015 FINAL  Final  AFB culture with smear     Status: None (Preliminary result)   Collection Time: 02/18/15  9:15 PM  Result Value Ref Range Status   Specimen Description BRONCHIAL ALVEOLAR LAVAGE  Final   Special Requests NONE  Final   Acid  Fast Smear   Final    NO ACID FAST BACILLI SEEN [facility]    Culture   Final    CULTURE WILL BE EXAMINED FOR 6 WEEKS BEFORE ISSUING A FINAL REPORT [facility]formed at [facility]    Report Status PENDING  Incomplete  Respiratory virus panel     Status: None   Collection Time: 02/18/15  9:15 PM  Result Value Ref Range Status   Source - RVPAN BRONCHIAL ALVEOLAR LAVAGE  Corrected   Respiratory Syncytial Virus A Negative Negative Final   Respiratory Syncytial Virus B Negative Negative Final   Influenza A Negative Negative Final   Influenza B Negative Negative Final   Parainfluenza 1 Negative Negative Final   Parainfluenza 2 Negative Negative Final   Parainfluenza 3 Negative Negative Final   Metapneumovirus Negative Negative Final   Rhinovirus Negative Negative Final   Adenovirus Negative Negative Final    Comment: (NOTE) Performed At: [facility] [address] [place], [place] [id number] Mila HomerHancock William F MD WU:[id number]:[phone]   Fungus Culture with Smear     Status: None (Preliminary result)   Collection Time: 02/18/15  9:15 PM  Result Value Ref Range Status   Specimen Description BRONCHIAL ALVEOLAR LAVAGE  Final   Special Requests NONE  Final   Fungal Smear   Final    NO YEAST OR FUNGAL ELEMENTS SEEN [facility]formed [facility]    Culture   Final    CULTURE IN PROGRESS FOR FOUR WEEKS Performed at [facility]    Report Status PENDING  Incomplete  Blood culture (routine x 2)     Status: None  (Preliminary result)   Collection Time: 02/18/15  9:22 PM  Result Value Ref Range Status   Specimen Description BLOOD LEFT HAND  Final   Special Requests BOTTLES DRAWN AEROBIC ONLY 7CC  Final   Culture NO GROWTH 2 DAYS  Final   Report Status PENDING  Incomplete   Assessment: 59 yo male with pneumonia will be continued on vancomycin and ceftazidime.    6/28 Vanc>> 6/28 Ceftaz>>  6/28 BCx2>>NGTD 6/28 RSV>>NGTD  6/28 PCP>> negative 6/28: RVP negative 6/28: BAL - normal flora 6/28: MRSA positive  7/1 VT 17 on 750mg  IV q12  Goal of Therapy:  Vancomycin trough level 15-20 mcg/ml  Plan:  Continue vancomycin 750mg  IV q12 and ceftazidime 1g IV q8h  Mickeal SkinnerFrens, Sujay Grundman John 02/21/2015,11:01 AM

## 2015-02-21 NOTE — Progress Notes (Signed)
Chest xray report called by radiology relayed to Dr Delton CoombesByrum

## 2015-02-21 NOTE — Progress Notes (Signed)
Patient with tube feeding coming out of mouth suctioned orally and ETT.tube feeding turned off. Right neck noted to have increased swelling . Dr Delton CoombesByrum called and informed .chest xray ordered

## 2015-02-21 NOTE — Progress Notes (Signed)
eLink Physician-Brief Progress Note Patient Name: Jerline PainDurwood A Cawthorn DOB: 26-Aug-1955 MRN: [id number]   Date of Service  02/21/2015  HPI/Events of Note  desatn with turning Vent Asynchrony on camera Air leak on left  eICU Interventions  Nimbex gtt for ARDS per protocol RASS goal -5     Intervention Category Major Interventions: Respiratory failure - evaluation and management  Deni Lefever V. 02/21/2015, 11:44 PM

## 2015-02-22 ENCOUNTER — Inpatient Hospital Stay (HOSPITAL_COMMUNITY): Payer: 59

## 2015-02-22 DIAGNOSIS — J9600 Acute respiratory failure, unspecified whether with hypoxia or hypercapnia: Secondary | ICD-10-CM

## 2015-02-22 DIAGNOSIS — Z4659 Encounter for fitting and adjustment of other gastrointestinal appliance and device: Secondary | ICD-10-CM | POA: Insufficient documentation

## 2015-02-22 DIAGNOSIS — Z452 Encounter for adjustment and management of vascular access device: Secondary | ICD-10-CM

## 2015-02-22 DIAGNOSIS — Z87898 Personal history of other specified conditions: Secondary | ICD-10-CM

## 2015-02-22 LAB — CBC
HEMATOCRIT: 31.9 % — AB (ref 39.0–52.0)
HEMOGLOBIN: 9.6 g/dL — AB (ref 13.0–17.0)
MCH: 26.5 pg (ref 26.0–34.0)
MCHC: 30.1 g/dL (ref 30.0–36.0)
MCV: 88.1 fL (ref 78.0–100.0)
Platelets: 217 10*3/uL (ref 150–400)
RBC: 3.62 MIL/uL — AB (ref 4.22–5.81)
RDW: 21.2 % — AB (ref 11.5–15.5)
WBC: 17.7 10*3/uL — AB (ref 4.0–10.5)

## 2015-02-22 LAB — BASIC METABOLIC PANEL
ANION GAP: 9 (ref 5–15)
ANION GAP: 7 (ref 5–15)
ANION GAP: 6 (ref 5–15)
Anion gap: 8 (ref 5–15)
Anion gap: 9 (ref 5–15)
Anion gap: 8 (ref 5–15)
BUN: 47 mg/dL — AB (ref 6–20)
BUN: 55 mg/dL — AB (ref 6–20)
BUN: 60 mg/dL — AB (ref 6–20)
BUN: 66 mg/dL — ABNORMAL HIGH (ref 6–20)
BUN: 65 mg/dL — AB (ref 6–20)
BUN: 68 mg/dL — ABNORMAL HIGH (ref 6–20)
CALCIUM: 8.2 mg/dL — AB (ref 8.9–10.3)
CALCIUM: 8.2 mg/dL — AB (ref 8.9–10.3)
CHLORIDE: 114 mmol/L — AB (ref 101–111)
CHLORIDE: 115 mmol/L — AB (ref 101–111)
CHLORIDE: 118 mmol/L — AB (ref 101–111)
CO2: 24 mmol/L (ref 22–32)
CO2: 25 mmol/L (ref 22–32)
CO2: 23 mmol/L (ref 22–32)
CO2: 24 mmol/L (ref 22–32)
CO2: 25 mmol/L (ref 22–32)
CO2: 28 mmol/L (ref 22–32)
CREATININE: 2.14 mg/dL — AB (ref 0.61–1.24)
CREATININE: 2.17 mg/dL — AB (ref 0.61–1.24)
Calcium: 8 mg/dL — ABNORMAL LOW (ref 8.9–10.3)
Calcium: 7.8 mg/dL — ABNORMAL LOW (ref 8.9–10.3)
Calcium: 7.8 mg/dL — ABNORMAL LOW (ref 8.9–10.3)
Calcium: 7.9 mg/dL — ABNORMAL LOW (ref 8.9–10.3)
Chloride: 113 mmol/L — ABNORMAL HIGH (ref 101–111)
Chloride: 116 mmol/L — ABNORMAL HIGH (ref 101–111)
Chloride: 117 mmol/L — ABNORMAL HIGH (ref 101–111)
Creatinine, Ser: 1.42 mg/dL — ABNORMAL HIGH (ref 0.61–1.24)
Creatinine, Ser: 1.6 mg/dL — ABNORMAL HIGH (ref 0.61–1.24)
Creatinine, Ser: 1.86 mg/dL — ABNORMAL HIGH (ref 0.61–1.24)
Creatinine, Ser: 2.24 mg/dL — ABNORMAL HIGH (ref 0.61–1.24)
GFR calc Af Amer: 36 mL/min — ABNORMAL LOW (ref >60)
GFR calc non Af Amer: 52 mL/min — ABNORMAL LOW (ref >60)
GFR calc non Af Amer: 45 mL/min — ABNORMAL LOW (ref >60)
GFR calc non Af Amer: 37 mL/min — ABNORMAL LOW (ref >60)
GFR calc non Af Amer: 32 mL/min — ABNORMAL LOW (ref >60)
GFR calc non Af Amer: 30 mL/min — ABNORMAL LOW (ref >60)
GFR calc non Af Amer: 31 mL/min — ABNORMAL LOW (ref >60)
GFR, EST AFRICAN AMERICAN: 52 mL/min — AB (ref >60)
GFR, EST AFRICAN AMERICAN: 43 mL/min — AB (ref >60)
GFR, EST AFRICAN AMERICAN: 37 mL/min — AB (ref >60)
GFR, EST AFRICAN AMERICAN: 35 mL/min — AB (ref >60)
GLUCOSE: 387 mg/dL — AB (ref 65–99)
GLUCOSE: 336 mg/dL — AB (ref 65–99)
Glucose, Bld: 261 mg/dL — ABNORMAL HIGH (ref 65–99)
Glucose, Bld: 374 mg/dL — ABNORMAL HIGH (ref 65–99)
Glucose, Bld: 412 mg/dL — ABNORMAL HIGH (ref 65–99)
Glucose, Bld: 427 mg/dL — ABNORMAL HIGH (ref 65–99)
POTASSIUM: 6 mmol/L — AB (ref 3.5–5.1)
Potassium: 6.2 mmol/L (ref 3.5–5.1)
Potassium: 6.3 mmol/L (ref 3.5–5.1)
Potassium: 5.7 mmol/L — ABNORMAL HIGH (ref 3.5–5.1)
Potassium: 5.3 mmol/L — ABNORMAL HIGH (ref 3.5–5.1)
Potassium: 4.6 mmol/L (ref 3.5–5.1)
SODIUM: 146 mmol/L — AB (ref 135–145)
SODIUM: 150 mmol/L — AB (ref 135–145)
Sodium: 146 mmol/L — ABNORMAL HIGH (ref 135–145)
Sodium: 146 mmol/L — ABNORMAL HIGH (ref 135–145)
Sodium: 149 mmol/L — ABNORMAL HIGH (ref 135–145)
Sodium: 152 mmol/L — ABNORMAL HIGH (ref 135–145)

## 2015-02-22 LAB — POCT I-STAT 3, ART BLOOD GAS (G3+)
ACID-BASE DEFICIT: 5 mmol/L — AB (ref 0.0–2.0)
Acid-base deficit: 5 mmol/L — ABNORMAL HIGH (ref 0.0–2.0)
Acid-base deficit: 5 mmol/L — ABNORMAL HIGH (ref 0.0–2.0)
Acid-base deficit: 5 mmol/L — ABNORMAL HIGH (ref 0.0–2.0)
Acid-base deficit: 2 mmol/L (ref 0.0–2.0)
Bicarbonate: 22.7 mEq/L (ref 20.0–24.0)
Bicarbonate: 23.7 mEq/L (ref 20.0–24.0)
Bicarbonate: 22.6 mEq/L (ref 20.0–24.0)
Bicarbonate: 23.1 mEq/L (ref 20.0–24.0)
Bicarbonate: 25.6 mEq/L — ABNORMAL HIGH (ref 20.0–24.0)
O2 SAT: 90 %
O2 SAT: 83 %
O2 Saturation: 85 %
O2 Saturation: 89 %
O2 Saturation: 94 %
PCO2 ART: 63.5 mmHg — AB (ref 35.0–45.0)
PH ART: 7.181 — AB (ref 7.350–7.450)
PH ART: 7.205 — AB (ref 7.350–7.450)
Patient temperature: 98.5
Patient temperature: 98.9
Patient temperature: 98.9
Patient temperature: 98.6
Patient temperature: 98.6
TCO2: 24 mmol/L (ref 0–100)
TCO2: 26 mmol/L (ref 0–100)
TCO2: 24 mmol/L (ref 0–100)
TCO2: 25 mmol/L (ref 0–100)
TCO2: 27 mmol/L (ref 0–100)
pCO2 arterial: 52.2 mmHg — ABNORMAL HIGH (ref 35.0–45.0)
pCO2 arterial: 56 mmHg — ABNORMAL HIGH (ref 35.0–45.0)
pCO2 arterial: 58.6 mmHg (ref 35.0–45.0)
pCO2 arterial: 59.6 mmHg (ref 35.0–45.0)
pH, Arterial: 7.245 — ABNORMAL LOW (ref 7.350–7.450)
pH, Arterial: 7.216 — ABNORMAL LOW (ref 7.350–7.450)
pH, Arterial: 7.241 — ABNORMAL LOW (ref 7.350–7.450)
pO2, Arterial: 59 mmHg — ABNORMAL LOW (ref 80.0–100.0)
pO2, Arterial: 74 mmHg — ABNORMAL LOW (ref 80.0–100.0)
pO2, Arterial: 59 mmHg — ABNORMAL LOW (ref 80.0–100.0)
pO2, Arterial: 70 mmHg — ABNORMAL LOW (ref 80.0–100.0)
pO2, Arterial: 86 mmHg (ref 80.0–100.0)

## 2015-02-22 LAB — MAGNESIUM
Magnesium: 2.9 mg/dL — ABNORMAL HIGH (ref 1.7–2.4)
Magnesium: 3 mg/dL — ABNORMAL HIGH (ref 1.7–2.4)

## 2015-02-22 LAB — GLUCOSE, CAPILLARY
Glucose-Capillary: 197 mg/dL — ABNORMAL HIGH (ref 65–99)
Glucose-Capillary: 264 mg/dL — ABNORMAL HIGH (ref 65–99)
Glucose-Capillary: 371 mg/dL — ABNORMAL HIGH (ref 65–99)
Glucose-Capillary: 392 mg/dL — ABNORMAL HIGH (ref 65–99)

## 2015-02-22 LAB — SEDIMENTATION RATE: Sed Rate: 67 mm/hr — ABNORMAL HIGH (ref 0–16)

## 2015-02-22 LAB — PHOSPHORUS
PHOSPHORUS: 6.1 mg/dL — AB (ref 2.5–4.6)
Phosphorus: 4.6 mg/dL (ref 2.5–4.6)

## 2015-02-22 LAB — TRIGLYCERIDES: Triglycerides: 471 mg/dL — ABNORMAL HIGH (ref <150)

## 2015-02-22 MED ORDER — SODIUM POLYSTYRENE SULFONATE 15 GM/60ML PO SUSP
15.0000 g | Freq: Once | ORAL | Status: AC
  Administered 2015-02-22: 15 g
  Filled 2015-02-22: qty 60

## 2015-02-22 MED ORDER — SODIUM BICARBONATE 8.4 % IV SOLN
INTRAVENOUS | Status: DC
  Filled 2015-02-22: qty 75

## 2015-02-22 MED ORDER — SODIUM CHLORIDE 0.9 % IV SOLN
1000.0000 mg | Freq: Every day | INTRAVENOUS | Status: AC
  Administered 2015-02-22 – 2015-02-24 (×3): 1000 mg via INTRAVENOUS
  Filled 2015-02-22 (×3): qty 8

## 2015-02-22 MED ORDER — PROPOFOL 1000 MG/100ML IV EMUL
5.0000 ug/kg/min | INTRAVENOUS | Status: DC
  Administered 2015-02-22 – 2015-02-24 (×2): 5 ug/kg/min via INTRAVENOUS
  Administered 2015-02-24: 40 ug/kg/min via INTRAVENOUS
  Administered 2015-02-25: 20 ug/kg/min via INTRAVENOUS
  Administered 2015-02-25: 40 ug/kg/min via INTRAVENOUS
  Filled 2015-02-22 (×6): qty 100

## 2015-02-22 MED ORDER — METOPROLOL TARTRATE 1 MG/ML IV SOLN
INTRAVENOUS | Status: AC
  Filled 2015-02-22: qty 5

## 2015-02-22 MED ORDER — SODIUM CHLORIDE 0.9 % IV SOLN
100.0000 mg | Freq: Every day | INTRAVENOUS | Status: DC
  Administered 2015-02-22 – 2015-02-25 (×4): 100 mg via INTRAVENOUS
  Filled 2015-02-22 (×4): qty 100

## 2015-02-22 MED ORDER — SODIUM CHLORIDE 0.9 % IV SOLN
INTRAVENOUS | Status: DC
  Administered 2015-02-22: 2.9 [IU]/h via INTRAVENOUS
  Administered 2015-02-24: 13.6 [IU]/h via INTRAVENOUS
  Administered 2015-02-25: 4.6 [IU]/h via INTRAVENOUS
  Filled 2015-02-22 (×4): qty 2.5

## 2015-02-22 MED ORDER — INSULIN ASPART 100 UNIT/ML IV SOLN
8.0000 [IU] | Freq: Once | INTRAVENOUS | Status: AC
  Administered 2015-02-22: 8 [IU] via INTRAVENOUS

## 2015-02-22 MED ORDER — SODIUM BICARBONATE 8.4 % IV SOLN
INTRAVENOUS | Status: DC
  Administered 2015-02-22 – 2015-02-23 (×2): via INTRAVENOUS
  Filled 2015-02-22 (×3): qty 150

## 2015-02-22 MED ORDER — INSULIN GLARGINE 100 UNIT/ML ~~LOC~~ SOLN
10.0000 [IU] | Freq: Every day | SUBCUTANEOUS | Status: DC
  Administered 2015-02-22: 10 [IU] via SUBCUTANEOUS
  Filled 2015-02-22: qty 0.1

## 2015-02-22 MED ORDER — METOPROLOL TARTRATE 1 MG/ML IV SOLN
2.5000 mg | INTRAVENOUS | Status: DC | PRN
  Administered 2015-02-22: 2.5 mg via INTRAVENOUS
  Administered 2015-02-26 – 2015-03-01 (×5): 5 mg via INTRAVENOUS
  Administered 2015-03-01: 2.5 mg via INTRAVENOUS
  Administered 2015-03-01: 5 mg via INTRAVENOUS
  Administered 2015-03-01: 2 mg via INTRAVENOUS
  Filled 2015-02-22 (×8): qty 5

## 2015-02-22 MED ORDER — SODIUM POLYSTYRENE SULFONATE 15 GM/60ML PO SUSP: 45.0000 g | Freq: Once | ORAL | Status: DC

## 2015-02-22 MED ORDER — SODIUM BICARBONATE 8.4 % IV SOLN
INTRAVENOUS | Status: AC
  Filled 2015-02-22: qty 100

## 2015-02-22 MED ORDER — INSULIN GLARGINE 100 UNIT/ML ~~LOC~~ SOLN
5.0000 [IU] | Freq: Every day | SUBCUTANEOUS | Status: DC
Start: 2015-02-22 — End: 2015-02-22
  Filled 2015-02-22: qty 0.05

## 2015-02-22 MED ORDER — SODIUM POLYSTYRENE SULFONATE 15 GM/60ML PO SUSP
45.0000 g | Freq: Once | ORAL | Status: AC
  Administered 2015-02-22: 45 g
  Filled 2015-02-22: qty 180

## 2015-02-22 NOTE — Progress Notes (Signed)
PULMONARY / CRITICAL CARE MEDICINE   Name: Derek Anthony MRN: [id number] DOB: [birth year]    ADMISSION DATE:  02/18/2015 CONSULTATION DATE:  02/22/2015   REFERRING MD :  EDP  CHIEF COMPLAINT:  Dyspnea, respiratory distress  INITIAL PRESENTATION:  59 y.o. M with recent dx dermatomyositis (s/p 2 cycles of Methotrexate last being 6/13 now on chronic pred), brought to [facility] ED 6/28 for worsening dyspnea and weakness.  Intubated in ED by PCCM and admitted to ICU.   STUDIES:  FOB 6/28 >>> BAL culture - neg, PCP - neg, AFB - neg, Cell Ct - 463 WBC, 65% mono, 27% pmn, 8% lymph, 0% eos, Cytology - neg for CA , Flow Cytometry - non diagnostic, BAL Fungal Culture - neg CXR 6/28 >>> worsening aeration with bilateral opacities.  Concerning for multifocal PNA vs edema.  SIGNIFICANT EVENTS: 6/14 through 6/23 - admitted. 6/28 - re-admitted with worsening hypoxic respiratory failure. 7/1 B chest tubes placed 7/2 - worsening subq air, worsening abg, on nimbex, hypertensive, tachycardic   SUBJECTIVE:   Worsening sub-q air R.  Worsening ABG.  Tachy 140's, Hypertensive.   VITAL SIGNS: Temp:  [98 F (36.7 C)-99.3 F (37.4 C)] 98.5 F (36.9 C) (07/02 0826) Pulse Rate:  [73-124] 122 (07/02 0645) Resp:  [22-37] 35 (07/02 0645) BP: (87-151)/(47-84) 151/76 mmHg (07/02 0812) SpO2:  [88 %-98 %] 94 % (07/02 0645) Arterial Line BP: (102-161)/(30-69) 141/65 mmHg (07/02 0645) FiO2 (%):  [70 %] 70 % (07/02 0812) Weight:  [232 lb 2.3 oz (105.3 kg)] 232 lb 2.3 oz (105.3 kg) (07/01 1300) HEMODYNAMICS:   VENTILATOR SETTINGS: Vent Mode:  [-] PRVC FiO2 (%):  [70 %] 70 % Set Rate:  [35 bmp] 35 bmp Vt Set:  [480 mL] 480 mL PEEP:  [10 cmH20] 10 cmH20 Plateau Pressure:  [18 cmH20-26 cmH20] 24 cmH20 INTAKE / OUTPUT: Intake/Output      07/01 0701 - 07/02 0700 07/02 0701 - 07/03 0700   I.V. (mL/kg) 2186.5 (20.8)    NG/GT 400    IV Piggyback 550    Total Intake(mL/kg) 3136.5 (29.8)    Urine (mL/kg/hr) 3375 (1.3)  125 (0.5)   Emesis/NG output 150 (0.1)    Chest Tube 0 (0)    Total Output 3525 125   Net -388.5 -125          PHYSICAL EXAMINATION: General: WDWN male, critically ill appearing  Neuro: sedated, paralyzed on vent, bis 35  HEENT: [place]/AT. PERRL, sclerae anicteric. extensive sub q air R neck/chest Cardiovascular: Tachy, regular, no M/R/G.  Lungs: Respirations even and unlabored on vent, rate 35.  Diminished throughout, few scattered rhonchi  Abdomen: BS x 4, soft, NT/ND.  Musculoskeletal: No gross deformities, no edema.  Skin: Intact, warm.  Gottron papules noted on R hand.   LABS:  CBC  Recent Labs Lab 02/19/15 0224 02/20/15 0225 02/22/15 0628  WBC 14.6* 14.4* 17.7*  HGB 8.8* 8.9* 9.6*  HCT 28.9* 28.7* 31.9*  PLT 261 272 217   Coag's No results for input(s): APTT, INR in the last 168 hours. BMET  Recent Labs Lab 02/19/15 0224 02/20/15 0225 02/22/15 0628  NA 144 145 146*  K 5.1 4.8 6.0*  CL 115* 114* 113*  CO2 22 23 24   BUN 26* 28* 47*  CREATININE 1.47* 1.25* 1.42*  GLUCOSE 138* 282* 261*   Electrolytes  Recent Labs Lab 02/19/15 0224  02/20/15 0225  02/21/15 1104 02/21/15 2234 02/22/15 0628  CALCIUM 7.0*  --  7.7*  --   --   --  8.2*  MG  --   < >  --   < > 2.8* 2.9* 3.0*  PHOS  --   < >  --   < > 4.0 4.6 6.1*  < > = values in this interval not displayed. Sepsis Markers  Recent Labs Lab 02/18/15 1723  LATICACIDVEN 4.03*   ABG  Recent Labs Lab 02/20/15 2308 02/21/15 0435 02/22/15 0402  PHART 7.326* 7.313* 7.245*  PCO2ART 42.8 43.3 52.2*  PO2ART 81.0 68.0* 59.0*   Liver Enzymes No results for input(s): AST, ALT, ALKPHOS, BILITOT, ALBUMIN in the last 168 hours. Cardiac Enzymes No results for input(s): TROPONINI, PROBNP in the last 168 hours. Glucose  Recent Labs Lab 02/21/15 1122 02/21/15 1557 02/21/15 1947 02/21/15 2329 02/22/15 0347 02/22/15 0828  GLUCAP 116* 133* 148* 183* 197* 264*    Imaging Dg Chest Port 1  View  02/22/2015   CLINICAL DATA:  Shortness of breath, weakness.  EXAM: PORTABLE CHEST - 1 VIEW  COMPARISON:  02/21/2015  FINDINGS: Bilateral lateral pigtail catheters in the pleural spaces, no visible pneumothorax. Subcutaneous air in the right chest wall may have increased slightly since prior study. Endotracheal tube and, right central line and NG tube are unchanged. Patchy by lateral opacities, slightly improved in the left lung since prior study.  IMPRESSION: Bilateral pleural pigtail catheter is in place. No pneumothorax could be visualized. There does appear to be increasing right subcutaneous emphysema.  Bibasilar patchy airspace opacities, improving on the left.   Electronically Signed   By: Charlett Nose M.D.   On: 02/22/2015 08:39   Dg Chest Port 1 View  02/21/2015   CLINICAL DATA:  Follow up pneumothorax. Bilateral chest tubes. Intubated patient.  EXAM: PORTABLE CHEST - 1 VIEW  COMPARISON:  02/21/2015 at 1453 hours  FINDINGS: Pigtail catheter projects over the right mid to inferior lateral pleural space. There is only a minimal residual right pneumothorax, decreased from the earlier exam.  There is a similar position pigtail catheter in the left lateral lower pleural space. There is no convincing residual left pneumothorax.  Neck base and right chest wall subcutaneous emphysema similar to the earlier study.  There is hazy airspace opacity in the left mid to upper lung, which appears increased from the earlier exam, although the difference may be technical only. No convincing pleural effusion.  Endotracheal tube, nasogastric tube and right internal jugular central venous line are stable and well positioned.  IMPRESSION: 1. New bilateral chest tubes. Minimal residual pneumothorax on the right with no convincing residual pneumothorax on the left. 2. Hazy opacity in the left mid to upper lung appears mildly increased from the prior exam. This this apparent change is likely technical due to differences in  lung volumes and patient positioning. 3. This other support apparatus is stable in well positioned.   Electronically Signed   By: Amie Portland M.D.   On: 02/21/2015 16:30   Dg Chest Port 1 View  02/21/2015   CLINICAL DATA:  Acute respiratory failure  EXAM: PORTABLE CHEST - 1 VIEW  COMPARISON:  02/21/2015 at 0818 hours  FINDINGS: Endotracheal tube terminates 5 cm above the carina.  Tiny left apical pneumothorax. Small right apical pneumothorax. Suspected pneumomediastinum.  Overlying subcutaneous emphysema in the right greater than left upper chest.  Mild bibasilar atelectasis.  Mild cardiomegaly.  Right IJ venous catheter terminates at the cavoatrial junction.  Enteric tube courses into the proximal duodenum.  IMPRESSION: Tiny left apical pneumothorax. Small right apical pneumothorax. Suspected pneumomediastinum.  Associated subcutaneous emphysema.  These results were called by telephone at the time of interpretation on 02/21/2015 at 3:07 pm to Dr. Levy PupaOBERT BYRUM, who verbally acknowledged these results.   Electronically Signed   By: Charline BillsSriyesh  Krishnan M.D.   On: 02/21/2015 15:09    ASSESSMENT / PLAN:  PULMONARY OETT 6/28 >>> A: Acute hypoxic respiratory failure in setting worsening bilateral airspace disease. ?HCAP v inflammatory process given hx ARDS Baseline dermatomyositis - s/p 2 cycles methotrexate and on daily prednisone.  Pneumomediastinum, B apical PTX's - now with extensive subq emphysema  P:   ARDS protocol - wean peep as able with worsening sub-q air  Increase R chest tube suction to 30cm H20  F/u BAL cultures as below  VAP bundle. Abx per ID section. Continue solumedrol  Follow CXR in am and later today  Need to consider high dose anti-inflammatory regimen (steroids, cytoxan) vs VATS with biopsy-would not tol VATS at this time   CARDIOVASCULAR A:  Sinus tach. HTN. P:  Lopressor PRN. Hold outpatient olmesartan. Check CVP  F/u electrolytes  Increase sedation - see below    RENAL A:   AKI ? Hypocalcemia - no albumin to correct for. Hyperkalemia  P:   KVO IVF  kayexalate x1 7/2 F/u chem stat and this pm  F/u chem in am   GASTROINTESTINAL A:   GERD. Nutrition. P:   SUP: Famotidine. TF's  HEMATOLOGIC A:   Chronic anemia. VTE Prophylaxis. P:  SCD's / Heparin. Follow CBC  INFECTIOUS A:   Probable HCAP given hx and CXR findings + being on chronic prednisone. Wbc trending up 7/2 P:   BCx2 6/28 > BAL 6/28 > normal flora RVP 6/29 >> negative U. Strep 6/28 > negative U. Legionella 6/28 > negative PCP smear 6/28 > negative Abx: Vanc, start date 6/28 >>  Abx: Ceftaz, start date 6/28 >>   Will need to consider opportunistic organisms, but note all cx data negative.  Wbc trending up but remains on steroids  Cont vanc/ceftaz for now   ENDOCRINE A:   DM. Hyperglycemic with the addition of TF P:   SSI, TF coverage  Add lantus 7/2  NEUROLOGIC A:   Acute encephalopathy due to sedation. Paralyzed  P:   Sedation:  Propofol gtt / Fentanyl. RASS goal: -5 while on nimbex  Increase fentanyl gtt 7/2  Hold outpatient gabapentin, norco.  RHEUMATOLOGIC A: Baseline dermatomyositis - s/p 2 cycles methotrexate and on daily prednisone. P: Continue Solumedrol. Will follow up with Dr. Nickola MajorHawkes at outpatient, will likely require immunotherapy (cellcept, etc). ?initiate high dose steroids/cytoxan now with worsening clinical status    Family updated:  Will update family via phone if no one available 7/2  Interdisciplinary Family Meeting v Palliative Care Meeting:  Due by: 02/24/15.  Dirk DressKaty Whiteheart, NP 02/22/2015  9:34 AM Pager: [phone] or [phone]   STAFF NOTE: I, Rory Percyaniel Feinstein, MD FACP have personally reviewed patient's available data, including medical history, events of note, physical examination and test results as part of my evaluation. I have discussed with resident/NP and other care providers such as pharmacist, RN and  RRT. In addition, I personally evaluated patient and elicited key findings of: very sick, much worse, ronchi bilateral ,tachycardia, pcxr severe bilateral infiltrates, BAL done 28th helpful as no growth  / infection isolated, he is worsening, this is likely would be steroids responsive, will administer high dose solumedrol pulse, also with increased air leak, increase suction 40 , repeat pcxr now and in  am, keeping ARDS protocol, however with slight hyperkalemia and plat 24-25, will slight increase Tv to 6.5 cc/kg until K corrected, so to some degree permissive hypercapnia, add bicarb drip, keep abx empiric, add pcp prevention soon high dose roids, will assess G6PD prior to dapsone start, add prop with concern under sedation related to tachy, BB IV intermitten ok for now, wife updated in full, BIS train of 4 on going, assess cvp, may in fact ned lasix soon FACTT The patient is critically ill with multiple organ systems failure and requires high complexity decision making for assessment and support, frequent evaluation and titration of therapies, application of advanced monitoring technologies and extensive interpretation of multiple databases.   Critical Care Time devoted to patient care services described in this note is 30 Minutes. This time reflects time of care of this signee: Rory Percy, MD FACP. This critical care time does not reflect procedure time, or teaching time or supervisory time of PA/NP/Med student/Med Resident etc but could involve care discussion time. Rest per NP/medical resident whose note is outlined above and that I agree with   Mcarthur Rossetti. Tyson Alias, MD, FACP Pgr: [phone]  Pulmonary & Critical Care 02/22/2015 12:35 PM

## 2015-02-22 NOTE — Progress Notes (Signed)
Lung recruitment maneuver performed, pt tolerated well.

## 2015-02-23 ENCOUNTER — Inpatient Hospital Stay (HOSPITAL_COMMUNITY): Payer: 59

## 2015-02-23 DIAGNOSIS — I952 Hypotension due to drugs: Secondary | ICD-10-CM

## 2015-02-23 DIAGNOSIS — J80 Acute respiratory distress syndrome: Secondary | ICD-10-CM | POA: Insufficient documentation

## 2015-02-23 LAB — BLOOD GAS, ARTERIAL
Acid-base deficit: 0.8 mmol/L (ref 0.0–2.0)
Acid-base deficit: 1.3 mmol/L (ref 0.0–2.0)
Bicarbonate: 26.8 mEq/L — ABNORMAL HIGH (ref 20.0–24.0)
Bicarbonate: 26.2 mEq/L — ABNORMAL HIGH (ref 20.0–24.0)
DRAWN BY: 10006
Drawn by: 236041
FIO2: 1 %
FIO2: 1 %
MECHVT: 560 mL
O2 SAT: 88.5 %
O2 Saturation: 91.6 %
PATIENT TEMPERATURE: 98.6
PCO2 ART: 73.5 mmHg — AB (ref 35.0–45.0)
PEEP/CPAP: 8 cmH2O
PEEP: 8 cmH2O
Patient temperature: 98.6
RATE: 35 resp/min
RATE: 35 resp/min
TCO2: 29.2 mmol/L (ref 0–100)
TCO2: 28.4 mmol/L (ref 0–100)
VT: 560 mL
pCO2 arterial: 76.2 mmHg (ref 35.0–45.0)
pH, Arterial: 7.172 — CL (ref 7.350–7.450)
pH, Arterial: 7.177 — CL (ref 7.350–7.450)
pO2, Arterial: 69.2 mmHg — ABNORMAL LOW (ref 80.0–100.0)
pO2, Arterial: 71.4 mmHg — ABNORMAL LOW (ref 80.0–100.0)

## 2015-02-23 LAB — POCT I-STAT 3, ART BLOOD GAS (G3+)
ACID-BASE EXCESS: 2 mmol/L (ref 0.0–2.0)
ACID-BASE EXCESS: 3 mmol/L — AB (ref 0.0–2.0)
Acid-Base Excess: 2 mmol/L (ref 0.0–2.0)
Acid-Base Excess: 4 mmol/L — ABNORMAL HIGH (ref 0.0–2.0)
Acid-Base Excess: 5 mmol/L — ABNORMAL HIGH (ref 0.0–2.0)
BICARBONATE: 29.6 meq/L — AB (ref 20.0–24.0)
BICARBONATE: 28.1 meq/L — AB (ref 20.0–24.0)
BICARBONATE: 30 meq/L — AB (ref 20.0–24.0)
Bicarbonate: 27.6 mEq/L — ABNORMAL HIGH (ref 20.0–24.0)
Bicarbonate: 28.9 mEq/L — ABNORMAL HIGH (ref 20.0–24.0)
O2 SAT: 95 %
O2 Saturation: 91 %
O2 Saturation: 91 %
O2 Saturation: 96 %
O2 Saturation: 91 %
PCO2 ART: 41.4 mmHg (ref 35.0–45.0)
PCO2 ART: 45.6 mmHg — AB (ref 35.0–45.0)
PH ART: 7.449 (ref 7.350–7.450)
PH ART: 7.349 — AB (ref 7.350–7.450)
PH ART: 7.325 — AB (ref 7.350–7.450)
PO2 ART: 63 mmHg — AB (ref 80.0–100.0)
PO2 ART: 83 mmHg (ref 80.0–100.0)
Patient temperature: 98.6
Patient temperature: 97.4
TCO2: 29 mmol/L (ref 0–100)
TCO2: 30 mmol/L (ref 0–100)
TCO2: 31 mmol/L (ref 0–100)
TCO2: 30 mmol/L (ref 0–100)
TCO2: 32 mmol/L (ref 0–100)
pCO2 arterial: 48.7 mmHg — ABNORMAL HIGH (ref 35.0–45.0)
pCO2 arterial: 50.3 mmHg — ABNORMAL HIGH (ref 35.0–45.0)
pCO2 arterial: 57.1 mmHg (ref 35.0–45.0)
pH, Arterial: 7.362 (ref 7.350–7.450)
pH, Arterial: 7.418 (ref 7.350–7.450)
pO2, Arterial: 70 mmHg — ABNORMAL LOW (ref 80.0–100.0)
pO2, Arterial: 59 mmHg — ABNORMAL LOW (ref 80.0–100.0)
pO2, Arterial: 65 mmHg — ABNORMAL LOW (ref 80.0–100.0)

## 2015-02-23 LAB — COMPREHENSIVE METABOLIC PANEL
ALK PHOS: 104 U/L (ref 38–126)
ALT: 20 U/L (ref 17–63)
ANION GAP: 9 (ref 5–15)
AST: 14 U/L — AB (ref 15–41)
Albumin: 1.6 g/dL — ABNORMAL LOW (ref 3.5–5.0)
BUN: 67 mg/dL — ABNORMAL HIGH (ref 6–20)
CALCIUM: 7.9 mg/dL — AB (ref 8.9–10.3)
CO2: 29 mmol/L (ref 22–32)
CREATININE: 2.16 mg/dL — AB (ref 0.61–1.24)
Chloride: 119 mmol/L — ABNORMAL HIGH (ref 101–111)
GFR calc Af Amer: 36 mL/min — ABNORMAL LOW (ref >60)
GFR, EST NON AFRICAN AMERICAN: 31 mL/min — AB (ref >60)
GLUCOSE: 164 mg/dL — AB (ref 65–99)
Potassium: 4.3 mmol/L (ref 3.5–5.1)
SODIUM: 157 mmol/L — AB (ref 135–145)
Total Bilirubin: 0.5 mg/dL (ref 0.3–1.2)
Total Protein: 4.6 g/dL — ABNORMAL LOW (ref 6.5–8.1)

## 2015-02-23 LAB — GLUCOSE, CAPILLARY
GLUCOSE-CAPILLARY: 129 mg/dL — AB (ref 65–99)
GLUCOSE-CAPILLARY: 145 mg/dL — AB (ref 65–99)
GLUCOSE-CAPILLARY: 149 mg/dL — AB (ref 65–99)
GLUCOSE-CAPILLARY: 154 mg/dL — AB (ref 65–99)
GLUCOSE-CAPILLARY: 161 mg/dL — AB (ref 65–99)
GLUCOSE-CAPILLARY: 169 mg/dL — AB (ref 65–99)
GLUCOSE-CAPILLARY: 206 mg/dL — AB (ref 65–99)
GLUCOSE-CAPILLARY: 260 mg/dL — AB (ref 65–99)
GLUCOSE-CAPILLARY: 264 mg/dL — AB (ref 65–99)
GLUCOSE-CAPILLARY: 295 mg/dL — AB (ref 65–99)
GLUCOSE-CAPILLARY: 296 mg/dL — AB (ref 65–99)
GLUCOSE-CAPILLARY: 320 mg/dL — AB (ref 65–99)
GLUCOSE-CAPILLARY: 324 mg/dL — AB (ref 65–99)
GLUCOSE-CAPILLARY: 360 mg/dL — AB (ref 65–99)
Glucose-Capillary: 231 mg/dL — ABNORMAL HIGH (ref 65–99)
Glucose-Capillary: 245 mg/dL — ABNORMAL HIGH (ref 65–99)
Glucose-Capillary: 341 mg/dL — ABNORMAL HIGH (ref 65–99)
Glucose-Capillary: 354 mg/dL — ABNORMAL HIGH (ref 65–99)
Glucose-Capillary: 368 mg/dL — ABNORMAL HIGH (ref 65–99)
Glucose-Capillary: 195 mg/dL — ABNORMAL HIGH (ref 65–99)
Glucose-Capillary: 125 mg/dL — ABNORMAL HIGH (ref 65–99)
Glucose-Capillary: 154 mg/dL — ABNORMAL HIGH (ref 65–99)
Glucose-Capillary: 178 mg/dL — ABNORMAL HIGH (ref 65–99)
Glucose-Capillary: 214 mg/dL — ABNORMAL HIGH (ref 65–99)
Glucose-Capillary: 221 mg/dL — ABNORMAL HIGH (ref 65–99)
Glucose-Capillary: 238 mg/dL — ABNORMAL HIGH (ref 65–99)
Glucose-Capillary: 260 mg/dL — ABNORMAL HIGH (ref 65–99)

## 2015-02-23 LAB — CBC
HCT: 26.2 % — ABNORMAL LOW (ref 39.0–52.0)
HCT: 25.7 % — ABNORMAL LOW (ref 39.0–52.0)
HEMOGLOBIN: 7.9 g/dL — AB (ref 13.0–17.0)
Hemoglobin: 7.8 g/dL — ABNORMAL LOW (ref 13.0–17.0)
MCH: 25.6 pg — AB (ref 26.0–34.0)
MCH: 26.5 pg (ref 26.0–34.0)
MCHC: 29.8 g/dL — ABNORMAL LOW (ref 30.0–36.0)
MCHC: 30.7 g/dL (ref 30.0–36.0)
MCV: 85.9 fL (ref 78.0–100.0)
MCV: 86.2 fL (ref 78.0–100.0)
PLATELETS: 182 10*3/uL (ref 150–400)
PLATELETS: 161 10*3/uL (ref 150–400)
RBC: 3.05 MIL/uL — AB (ref 4.22–5.81)
RBC: 2.98 MIL/uL — ABNORMAL LOW (ref 4.22–5.81)
RDW: 20.7 % — ABNORMAL HIGH (ref 11.5–15.5)
RDW: 20.9 % — ABNORMAL HIGH (ref 11.5–15.5)
WBC: 12.2 10*3/uL — AB (ref 4.0–10.5)
WBC: 12.7 10*3/uL — ABNORMAL HIGH (ref 4.0–10.5)

## 2015-02-23 LAB — BASIC METABOLIC PANEL
ANION GAP: 9 (ref 5–15)
BUN: 67 mg/dL — ABNORMAL HIGH (ref 6–20)
CHLORIDE: 117 mmol/L — AB (ref 101–111)
CO2: 28 mmol/L (ref 22–32)
CREATININE: 2.17 mg/dL — AB (ref 0.61–1.24)
Calcium: 7.9 mg/dL — ABNORMAL LOW (ref 8.9–10.3)
GFR calc non Af Amer: 31 mL/min — ABNORMAL LOW (ref >60)
GFR, EST AFRICAN AMERICAN: 36 mL/min — AB (ref >60)
GLUCOSE: 264 mg/dL — AB (ref 65–99)
POTASSIUM: 4.4 mmol/L (ref 3.5–5.1)
Sodium: 154 mmol/L — ABNORMAL HIGH (ref 135–145)

## 2015-02-23 LAB — CULTURE, BLOOD (ROUTINE X 2)
Culture: NO GROWTH
Culture: NO GROWTH

## 2015-02-23 LAB — OCCULT BLOOD X 1 CARD TO LAB, STOOL: Fecal Occult Bld: NEGATIVE

## 2015-02-23 LAB — LACTIC ACID, PLASMA: Lactic Acid, Venous: 1.5 mmol/L (ref 0.5–2.0)

## 2015-02-23 LAB — CK: CK TOTAL: 24 U/L — AB (ref 49–397)

## 2015-02-23 MED ORDER — FREE WATER
200.0000 mL | Freq: Three times a day (TID) | Status: DC
  Administered 2015-02-23 – 2015-02-24 (×4): 200 mL

## 2015-02-23 MED ORDER — VANCOMYCIN HCL IN DEXTROSE 750-5 MG/150ML-% IV SOLN
750.0000 mg | INTRAVENOUS | Status: DC
  Filled 2015-02-23: qty 150

## 2015-02-23 MED ORDER — DEXTROSE 5 % IV SOLN
1.0000 g | Freq: Two times a day (BID) | INTRAVENOUS | Status: DC
  Administered 2015-02-23 – 2015-02-25 (×4): 1 g via INTRAVENOUS
  Filled 2015-02-23 (×5): qty 1

## 2015-02-23 NOTE — Progress Notes (Signed)
eLink Physician-Brief Progress Note Patient Name: Derek Anthony DOB: [birth year] MRN: [id number]  Date of Service  02/23/2015   HPI/Events of Note   Recent Labs Lab 02/22/15 1514 02/22/15 1721 02/22/15 1946 02/23/15 0045 02/23/15 0205  PHART 7.216* 7.205* 7.241* 7.172* 7.177*  PCO2ART 56.0* 58.6* 59.6* 76.2* 73.5*  PO2ART 59.0* 70.0* 86.0 69.2* 71.4*  HCO3 22.6 23.1 25.6* 26.8* 26.2*  TCO2 24 25 27  29.2 28.4  O2SAT 83.0 89.0 94.0 88.5 91.6   ABG some improed after incresing bic but pH still < 7.2   eICU Interventions  Increase Vt to 8cc/kg/IBW   Intervention Category Major Interventions: Acid-Base disturbance - evaluation and management Intermediate Interventions: Diagnostic test evaluation  Sabrine Patchen 02/23/2015, 2:22 AM

## 2015-02-23 NOTE — Progress Notes (Signed)
ANTIBIOTIC CONSULT NOTE - INITIAL  Pharmacy Consult for vancomycin and ceftazidime Indication: pneumonia  Allergies  Allergen Reactions  . Sulfa Antibiotics Itching and Rash    Burning also  . Penicillins Itching and Rash    Patient Measurements: Height: 6\' 1"  (185.4 cm) Weight: 235 lb 10.8 oz (106.9 kg) IBW/kg (Calculated) : 79.9  Vital Signs: Temp: 97.6 F (36.4 C) (07/03 0818) Temp Source: Oral (07/03 0818) BP: 94/57 mmHg (07/03 1000) Pulse Rate: 76 (07/03 1000) Intake/Output from previous day: 07/02 0701 - 07/03 0700 In: 6083.5 [I.V.:3615.5; NG/GT:1760; IV Piggyback:708] Out: 2110 [Urine:2110] Intake/Output from this shift:    Labs:  Recent Labs  02/22/15 0628  02/22/15 2200 02/23/15 0149 02/23/15 0445  WBC 17.7*  --   --   --  12.2*  HGB 9.6*  --   --   --  7.8*  PLT 217  --   --   --  182  CREATININE 1.42*  < > 2.17* 2.17* 2.16*  < > = values in this interval not displayed. Estimated Creatinine Clearance: 46.1 mL/min (by C-G formula based on Cr of 2.16).  [facility]  [facility]15 09[facility]     Microbiology: Recent Results (from the past 720 hour(s))  Culture, blood (routine x 2)     Status: None   Collection Time: 02/04/15  9:00 PM  Result Value Ref Range Status   Specimen Description BLOOD RIGHT ARM  Final   Special Requests BOTTLES DRAWN AEROBIC AND ANAEROBIC 5CC EA  Final   Culture   Final    NO GROWTH 5 DAYS [facility]    Report Status 02/11/2015 FINAL  Final  Culture, blood (routine x 2)     Status: None   Collection Time: 02/04/15  9:15 PM  Result Value Ref Range Status   Specimen Description BLOOD LEFT ARM  Final   Special Requests BOTTLES DRAWN AEROBIC AND ANAEROBIC 5CC EA  Final   Culture   Final    NO GROWTH 5 DAYS Performed at [facility]    Report Status 02/11/2015 FINAL  Final  Culture, sputum-assessment     Status: None   Collection Time: 02/06/15 11:27 AM  Result Value Ref Range  Status   Specimen Description SPUTUM  Final   Special Requests NONE  Final   Sputum evaluation   Final    MICROSCOPIC FINDINGS SUGGEST THAT THIS SPECIMEN IS NOT REPRESENTATIVE OF LOWER RESPIRATORY SECRETIONS. PLEASE RECOLLECT. Results Called to: R L'ESPERANCE,RN AT 1205 02/06/15 BY L BENFIELD    Report Status 02/06/2015 FINAL  Final  Culture, respiratory (NON-Expectorated)     Status: None   Collection Time: 02/06/15 12:38 PM  Result Value Ref Range Status   Specimen Description BRONCHIAL ALVEOLAR LAVAGE  Final   Special Requests Immunocompromised  Final   Gram Stain   Final    RARE WBC PRESENT,BOTH PMN AND MONONUCLEAR NO SQUAMOUS EPITHELIAL CELLS SEEN NO ORGANISMS SEEN Performed at [facility]    Culture   Final    Non-Pathogenic Oropharyngeal-type Flora Isolated. Performed at [facility]    Report Status 02/09/2015 FINAL  Final  Fungus Culture with Smear     Status: None (Preliminary result)   Collection Time: 02/06/15 12:38 PM  Result Value Ref Range Status   Specimen Description BRONCHIAL ALVEOLAR LAVAGE  Final   Special Requests Immunocompromised  Final   Fungal Smear   Final    NO YEAST OR FUNGAL ELEMENTS SEEN  Performed at [facility]   Final    CULTURE IN PROGRESS FOR FOUR WEEKS Performed at [facility]    Report Status PENDING  Incomplete  AFB culture with smear     Status: None (Preliminary result)   Collection Time: 02/06/15 12:38 PM  Result Value Ref Range Status   Specimen Description BRONCHIAL ALVEOLAR LAVAGE  Final   Special Requests Immunocompromised  Final   Acid Fast Smear   Final    NO ACID FAST BACILLI SEEN Performed at [facility]    Culture   Final    CULTURE WILL BE EXAMINED FOR 6 WEEKS BEFORE ISSUING A FINAL REPORT Performed at [facility]    Report Status PENDING  Incomplete  Pneumocystis smear by DFA     Status: None   Collection Time: 02/06/15 12:38 PM  Result Value Ref  Range Status   Specimen Source-PJSRC BRONCHIAL ALVEOLAR LAVAGE  Final   Pneumocystis jiroveci Ag NEGATIVE  Final    Comment: Performed at [facility]  Blood culture (routine x 2)     Status: None (Preliminary result)   Collection Time: 02/18/15  5:00 PM  Result Value Ref Range Status   Specimen Description BLOOD RIGHT ARM  Final   Special Requests BOTTLES DRAWN AEROBIC AND ANAEROBIC 5CC  Final   Culture NO GROWTH 4 DAYS  Final   Report Status PENDING  Incomplete  Culture, blood (routine x 2)     Status: None (Preliminary result)   Collection Time: 02/18/15  5:39 PM  Result Value Ref Range Status   Specimen Description BLOOD RIGHT HAND  Final   Special Requests BOTTLES DRAWN AEROBIC ONLY 4CC  Final   Culture NO GROWTH 3 DAYS  Final   Report Status PENDING  Incomplete  MRSA PCR Screening     Status: Abnormal   Collection Time: 02/18/15  8:45 PM  Result Value Ref Range Status   MRSA by PCR POSITIVE (A) NEGATIVE Final    Comment:        The GeneXpert MRSA Assay (FDA approved for NASAL specimens only), is one component of a comprehensive MRSA colonization surveillance program. It is not intended to diagnose MRSA infection nor to guide or monitor treatment for MRSA infections. RESULT CALLED TO, READ BACK BY AND VERIFIED WITH: SMITH,A RN [phone] 02/19/15 MITCHELL,L   Pneumocystis smear by DFA     Status: None   Collection Time: 02/18/15  9:15 PM  Result Value Ref Range Status   Specimen Source-PJSRC BRONCHIAL ALVEOLAR LAVAGE  Final   Pneumocystis jiroveci Ag NEGATIVE  Final    Comment: Performed at [facility]  Culture, respiratory (NON-Expectorated)     Status: None   Collection Time: 02/18/15  9:15 PM  Result Value Ref Range Status   Specimen Description BRONCHIAL ALVEOLAR LAVAGE  Final   Special Requests NONE  Final   Gram Stain   Final    RARE WBC PRESENT, PREDOMINANTLY MONONUCLEAR RARE SQUAMOUS EPITHELIAL CELLS PRESENT NO ORGANISMS  SEEN Performed at [facility]    Culture   Final    Non-Pathogenic Oropharyngeal-type Flora Isolated. Performed at [facility]    Report Status 02/21/2015 FINAL  Final  AFB culture with smear     Status: None (Preliminary result)   Collection Time: 02/18/15  9:15 PM  Result Value Ref Range Status   Specimen Description BRONCHIAL ALVEOLAR LAVAGE  Final  Special Requests NONE  Final   Acid Fast Smear   Final    NO ACID FAST BACILLI SEEN Performed at [facility]    Culture   Final    CULTURE WILL BE EXAMINED FOR 6 WEEKS BEFORE ISSUING A FINAL REPORT Performed at [facility]    Report Status PENDING  Incomplete  Respiratory virus panel     Status: None   Collection Time: 02/18/15  9:15 PM  Result Value Ref Range Status   Source - RVPAN BRONCHIAL ALVEOLAR LAVAGE  Corrected   Respiratory Syncytial Virus A Negative Negative Final   Respiratory Syncytial Virus B Negative Negative Final   Influenza A Negative Negative Final   Influenza B Negative Negative Final   Parainfluenza 1 Negative Negative Final   Parainfluenza 2 Negative Negative Final   Parainfluenza 3 Negative Negative Final   Metapneumovirus Negative Negative Final   Rhinovirus Negative Negative Final   Adenovirus Negative Negative Final    Comment: (NOTE) Performed At: [facility] [address] [place], [place] [id number] Mila Homer MD WU:[phone]   Fungus Culture with Smear     Status: None (Preliminary result)   Collection Time: 02/18/15  9:15 PM  Result Value Ref Range Status   Specimen Description BRONCHIAL ALVEOLAR LAVAGE  Final   Special Requests NONE  Final   Fungal Smear   Final    NO YEAST OR FUNGAL ELEMENTS SEEN Performed at [facility]    Culture   Final    CULTURE IN PROGRESS FOR FOUR WEEKS Performed at [facility]    Report Status PENDING  Incomplete  Blood culture (routine x 2)     Status: None (Preliminary result)    Collection Time: 02/18/15  9:22 PM  Result Value Ref Range Status   Specimen Description BLOOD LEFT HAND  Final   Special Requests BOTTLES DRAWN AEROBIC ONLY 7CC  Final   Culture NO GROWTH 4 DAYS  Final   Report Status PENDING  Incomplete   Assessment: 59 yo male with pneumonia will be continued on vancomycin and ceftazidime.  Renal function worsening overnight.  Micafungin added empirically.  6/28 Vanc>> 6/28 Ceftaz>> 7/2 micafungin>>  6/28 BCx2>>NGTD 6/28 RSV>>NGTD  6/28 PCP>> negative 6/28: RVP negative 6/28: BAL - normal flora 6/28: MRSA positive  7/1 VT 17 on 750mg  IV q12  Goal of Therapy:  Vancomycin trough level 15-20 mcg/ml  Plan:  Decrease ceftazidime to 1g IV q12 Change vancomycin to 750mg  IV q24 - check trough level tomorrow   Mickeal Skinner 02/23/2015,11:24 AM

## 2015-02-23 NOTE — Progress Notes (Signed)
Lung recruitment maneuver performed, pt tolerated well. sats improved to 91%.

## 2015-02-23 NOTE — Progress Notes (Addendum)
PULMONARY / CRITICAL CARE MEDICINE   Name: Derek Anthony MRN: [id number] DOB: [birth year]    ADMISSION DATE:  02/18/2015 CONSULTATION DATE:  02/23/2015   REFERRING MD :  EDP  CHIEF COMPLAINT:  Dyspnea, respiratory distress  INITIAL PRESENTATION:  59 y.o. M with recent dx dermatomyositis (s/p 2 cycles of Methotrexate last being 6/13 now on chronic pred), brought to [facility] ED 6/28 for worsening dyspnea and weakness.  Intubated in ED by PCCM and admitted to ICU.   STUDIES:  FOB 6/28 >>> BAL culture - neg, PCP - neg, AFB - neg, Cell Ct - 463 WBC, 65% mono, 27% pmn, 8% lymph, 0% eos, Cytology - neg for CA , Flow Cytometry - non diagnostic, BAL Fungal Culture - neg CXR 6/28 >>> worsening aeration with bilateral opacities.  Concerning for multifocal PNA vs edema.  SIGNIFICANT EVENTS: 6/14 through 6/23 - admitted. 6/28 - re-admitted with worsening hypoxic respiratory failure. 7/1 B chest tubes placed 7/2 - worsening subq air, worsening abg, on nimbex, hypertensive, tachycardic.  High dose steroids initiated    SUBJECTIVE:  Acidosis worse overnight, now much improved.  Hyperkalemia improved.  Hemodynamically stable.    VITAL SIGNS: Temp:  [97.6 F (36.4 C)-98.9 F (37.2 C)] 97.6 F (36.4 C) (07/03 0818) Pulse Rate:  [68-139] 73 (07/03 0818) Resp:  [28-35] 35 (07/03 0818) BP: (93-152)/(54-91) 119/91 mmHg (07/03 0818) SpO2:  [80 %-98 %] 95 % (07/03 0818) Arterial Line BP: (91-152)/(56-75) 112/57 mmHg (07/03 0600) FiO2 (%):  [70 %-100 %] 100 % (07/03 0818) Weight:  [235 lb 10.8 oz (106.9 kg)] 235 lb 10.8 oz (106.9 kg) (07/03 0517) HEMODYNAMICS: CVP:  [7 mmHg-11 mmHg] 9 mmHg VENTILATOR SETTINGS: Vent Mode:  [-] PRVC FiO2 (%):  [70 %-100 %] 100 % Set Rate:  [35 bmp] 35 bmp Vt Set:  [480 mL-640 mL] 640 mL PEEP:  [8 cmH20] 8 cmH20 Plateau Pressure:  [22 cmH20-36 cmH20] 30 cmH20 INTAKE / OUTPUT: Intake/Output      07/02 0701 - 07/03 0700 07/03 0701 - 07/04 0700   I.V. (mL/kg) 3615.5  (33.8)    NG/GT 1760    IV Piggyback 708    Total Intake(mL/kg) 6083.5 (56.9)    Urine (mL/kg/hr) 2110 (0.8)    Emesis/NG output     Chest Tube 0 (0)    Total Output 2110     Net +3973.5            PHYSICAL EXAMINATION: General: WDWN male, critically ill appearing  Neuro: sedated, paralyzed on vent, bis 35  HEENT: [place]/AT. PERRL, sclerae anicteric. extensive sub q air R neck/chest although slightly improved.  Cardiovascular: Tachy, regular, no M/R/G.  Lungs: Respirations even and unlabored on vent, rate 35.  Diminished throughout, few scattered rhonchi. Bilat chest tubes.  2/7 airleak L, no airleak R Abdomen: BS x 4, soft, NT/ND.  Musculoskeletal: No gross deformities, mild generalized edema  Skin: Intact, warm.  Gottron papules noted on R hand.   LABS:  CBC  Recent Labs Lab 02/20/15 0225 02/22/15 0628 02/23/15 0445  WBC 14.4* 17.7* 12.2*  HGB 8.9* 9.6* 7.8*  HCT 28.7* 31.9* 26.2*  PLT 272 217 182   Coag's No results for input(s): APTT, INR in the last 168 hours. BMET  Recent Labs Lab 02/22/15 2200 02/23/15 0149 02/23/15 0445  NA 152* 154* 157*  K 4.6 4.4 4.3  CL 118* 117* 119*  CO2 $Re'28 28 29  'bWZ$ BUN 68* 67* 67*  CREATININE 2.17* 2.17* 2.16*  GLUCOSE 336*  264* 164*   Electrolytes  Recent Labs Lab 02/21/15 1104 02/21/15 2234 02/22/15 0628  02/22/15 2200 02/23/15 0149 02/23/15 0445  CALCIUM  --   --  8.2*  < > 7.9* 7.9* 7.9*  MG 2.8* 2.9* 3.0*  --   --   --   --   PHOS 4.0 4.6 6.1*  --   --   --   --   < > = values in this interval not displayed. Sepsis Markers  Recent Labs Lab 02/18/15 1723 02/23/15 0150  LATICACIDVEN 4.03* 1.5   ABG  Recent Labs Lab 02/23/15 0045 02/23/15 0205 02/23/15 0527  PHART 7.172* 7.177* 7.362  PCO2ART 76.2* 73.5* 48.7*  PO2ART 69.2* 71.4* 63.0*   Liver Enzymes  Recent Labs Lab 02/23/15 0445  AST 14*  ALT 20  ALKPHOS 104  BILITOT 0.5  ALBUMIN 1.6*   Cardiac Enzymes No results for input(s): TROPONINI,  PROBNP in the last 168 hours. Glucose  Recent Labs Lab 02/23/15 0237 02/23/15 0335 02/23/15 0435 02/23/15 0538 02/23/15 0641 02/23/15 0749  GLUCAP 206* 195* 161* 169* 145* 129*    Imaging Dg Chest Port 1 View  02/22/2015   CLINICAL DATA:  Followup of pneumonia.  EXAM: PORTABLE CHEST - 1 VIEW  COMPARISON:  Earlier today at 1233 hours  FINDINGS: Endotracheal tube terminates 3.8 cm above carina. A right internal jugular line tip low SVC, similar. Nasogastric tube extends beyond the inferior aspect of the film.  Subcutaneous emphysema is again identified throughout the chest bilaterally. Mild cardiomegaly. Right-sided small bore pleural catheter in place without right-sided pneumothorax. A left-sided pleural catheter is identified. This is unchanged in position. Approximately 10% left apical pneumothorax is identified with visceral pleural line identified 1.9 cm from the chest wall.  Patchy lower lobe predominant airspace opacities are slightly improved.  IMPRESSION: Bilateral small bore pleural catheters in place. left-sided catheter is again positioned such that its side holes may be of outside of the pleural space. Development of an approximately 10% left apical pneumothorax. These results were called by telephone at the time of interpretation on 02/22/2015 at 9:73 pm to nurse [facility], who verbally acknowledged these results.  Improved bibasilar aeration.  Support apparatus appropriately positioned.   Electronically Signed   By: Abigail Miyamoto M.D.   On: 02/22/2015 19:40   Dg Chest Port 1 View  02/22/2015   CLINICAL DATA:  Bilateral chest tubes. Right chest swelling. Evaluate for pneumothorax.  EXAM: PORTABLE CHEST - 1 VIEW  COMPARISON:  02/22/2015 at 0655 hours  FINDINGS: Again noted are bilateral pigtail chest tubes. The position of the chest tubes appear unchanged. Suspect that some of the left chest tube's holes are outside of the pleural space. Again noted is a large amount of subcutaneous  emphysema. Endotracheal tube is roughly 6.4 cm above the carina. Central line tip in the lower SVC region. Nasogastric tube extends into the abdomen. Heart size is within normal limits. No evidence for a large pneumothorax. Again noted are patchy densities throughout both lungs which could represent edema or subtle airspace disease but difficult to evaluate with the subcutaneous edema. Again noted are air bronchograms in the retrocardiac space.  IMPRESSION: Chest radiograph findings have not significantly changed from the earlier examination.  Large amount of subcutaneous gas. Stable position of the chest tubes without a large pneumothorax. Please note that some side-holes of the left chest tube may be outside of the pleural space.  Patchy densities in lungs are similar to the previous examination  and difficult to characterize.   Electronically Signed   By: Markus Daft M.D.   On: 02/22/2015 13:11    ASSESSMENT / PLAN:  PULMONARY OETT 6/28 >>> A: Acute hypoxic respiratory failure in setting worsening bilateral airspace disease. ?HCAP v inflammatory process given hx ARDS Baseline dermatomyositis - s/p 2 cycles methotrexate and on daily prednisone.  Pneumomediastinum, B apical PTX's - now with extensive subq emphysema  P:   ARDS protocol - cont current setting with improved acidosis Cont bilat chest tubes, R to 30cm suction with increased sub q air  VAP bundle. Abx per ID section. High dose steroids -- solumedrol $RemoveBefor'1000mg'wBdnoxCIKLGD$  x 3 days started 7/2.   Follow CXR in am  F/u ABG  Permissive hypercapnia to some degree   Consider diuresis - hold for now with borderline BP   CARDIOVASCULAR A:  Sinus tach - improved.  Hx HTN  P:  Lopressor PRN. Hold outpatient olmesartan. Monitor CVP  F/u electrolytes  Cont high dose sedation while on nimbex  D/c propofol if hypotension   RENAL A:   AKI  ? Hypocalcemia - no albumin to correct for. Hyperkalemia - resolved.  Mixed acidosis - lactate normal   Hypernatremia  P:   Dc HCO3 gtt  Add free water 7/3 F/u chem in am   GASTROINTESTINAL A:   GERD. Nutrition. P:   SUP: Famotidine. TF's  HEMATOLOGIC A:   Chronic anemia - no s/s bleeding.  Hgb drop 9->7 7/3.  NO obvious s/s bleeding.   VTE Prophylaxis. P:  SCD's Hold heparin for now - resume when no further suspicion bleeding  guiac stools  F/u cbc this afternoon and in am  Transfuse for hgb <7  INFECTIOUS A:   ?HCAP given hx and CXR findings + being on chronic prednisone v strictly inflammatory  Wbc trending up 7/2 P:   BCx2 6/28 > BAL 6/28 > normal flora RVP 6/29 >> negative U. Strep 6/28 > negative U. Legionella 6/28 > negative PCP smear 6/28 > negative Abx: Vanc, start date 6/28 >>  Abx: Ceftaz, start date 6/28 >> myco 7/2>>>   Wbc trending up but remains on steroids  Empiric PCP coverage with high dose steroids - assess G6PD prior to dapsone start, awaited  ENDOCRINE A:   DM.  Significant hyperglycemia with addition high dose steroids  P:   SSI, TF coverage  Insulin gtt   NEUROLOGIC A:   Acute encephalopathy due to sedation. Paralyzed  P:   Sedation:  Propofol gtt / Fentanyl/versed  RASS goal: -5 while on nimbex  Hold outpatient gabapentin, norco.  RHEUMATOLOGIC A: Baseline dermatomyositis - s/p 2 cycles methotrexate and on daily prednisone. P: Continue Solumedrol. Will follow up with Dr. Trudie Reed at outpatient, will likely require immunotherapy (cellcept, etc)   Family updated:  Family updated daily at bedside.   Interdisciplinary Family Meeting v Palliative Care Meeting:  Due by: 02/24/15.   Nickolas Madrid, NP 02/23/2015  9:02 AM Pager: [phone] or [phone]   STAFF NOTE: I, Merrie Roof, MD FACP have personally reviewed patient's available data, including medical history, events of note, physical examination and test results as part of my evaluation. I have discussed with resident/NP and other care providers such as  pharmacist, RN and RRT. In addition, I personally evaluated patient and elicited key findings of: improved ph with T V increase but pressures are a concern (re reduce Tv to 7 , repeat abg), PTX noted left apex, increase suction 40, pcxr repeat in  4 hrs, less coarse, unchanged crepitus, will dc bicarb as K improved and ph is greater 7.25 on ards, seems some improvement overall since high dose steroids, await G6PD level prior to starting dapson for pcp prevention, would keep myco empiric given immunocompromised host, tolerating TF thus far, esr noted, unable to do WUA, Na noted, dc bicarb, add free water, if bicarb needed in future would use 2 amps in 1/4 normal, may need d5w, HR much improved, dc BB, did well with addition propofol, follow BIS The patient is critically ill with multiple organ systems failure and requires high complexity decision making for assessment and support, frequent evaluation and titration of therapies, application of advanced monitoring technologies and extensive interpretation of multiple databases.   Critical Care Time devoted to patient care services described in this note is 30 Minutes. This time reflects time of care of this signee: Merrie Roof, MD FACP. This critical care time does not reflect procedure time, or teaching time or supervisory time of PA/NP/Med student/Med Resident etc but could involve care discussion time. Rest per NP/medical resident whose note is outlined above and that I agree with   Lavon Paganini. Titus Mould, MD, [place] Pgr: [facility] 02/23/2015 11:04 AM

## 2015-02-23 NOTE — Progress Notes (Signed)
eLink Physician-Brief Progress Note Patient Name: Derek Anthony DOB: 14-May-1956 MRN: [id number]   Date of Service  02/23/2015  HPI/Events of Note   Recent Labs Lab 02/22/15 1350 02/22/15 1514 02/22/15 1721 02/22/15 1946 02/23/15 0045  PHART 7.181* 7.216* 7.205* 7.241* 7.172*  PCO2ART 63.5* 56.0* 58.6* 59.6* 76.2*  PO2ART 74.0* 59.0* 70.0* 86.0 69.2*  HCO3 23.7 22.6 23.1 25.6* 26.8*  TCO2 26 24 25 27  29.2  O2SAT 90.0 83.0 89.0 94.0 88.5   On diprivan On 7cc/kg/IBW  eICU Interventions  Increase bic gtt from 75cc to 100cc/h Check CK, lactate, bme - rule out diprivan infusion syndrome  Repeat abg in 1h     Intervention Category Major Interventions: Acid-Base disturbance - evaluation and management  Nataliya Graig 02/23/2015, 1:28 AM

## 2015-02-24 ENCOUNTER — Inpatient Hospital Stay (HOSPITAL_COMMUNITY): Payer: 59

## 2015-02-24 DIAGNOSIS — I9589 Other hypotension: Secondary | ICD-10-CM

## 2015-02-24 LAB — POCT I-STAT 3, ART BLOOD GAS (G3+)
ACID-BASE EXCESS: 1 mmol/L (ref 0.0–2.0)
ACID-BASE EXCESS: 2 mmol/L (ref 0.0–2.0)
ACID-BASE EXCESS: 4 mmol/L — AB (ref 0.0–2.0)
Acid-Base Excess: 5 mmol/L — ABNORMAL HIGH (ref 0.0–2.0)
Acid-Base Excess: 3 mmol/L — ABNORMAL HIGH (ref 0.0–2.0)
Acid-Base Excess: 1 mmol/L (ref 0.0–2.0)
Acid-Base Excess: 1 mmol/L (ref 0.0–2.0)
BICARBONATE: 27.3 meq/L — AB (ref 20.0–24.0)
BICARBONATE: 28.3 meq/L — AB (ref 20.0–24.0)
Bicarbonate: 28.2 mEq/L — ABNORMAL HIGH (ref 20.0–24.0)
Bicarbonate: 29 mEq/L — ABNORMAL HIGH (ref 20.0–24.0)
Bicarbonate: 29.1 mEq/L — ABNORMAL HIGH (ref 20.0–24.0)
Bicarbonate: 30 mEq/L — ABNORMAL HIGH (ref 20.0–24.0)
Bicarbonate: 30.7 mEq/L — ABNORMAL HIGH (ref 20.0–24.0)
O2 SAT: 91 %
O2 Saturation: 90 %
O2 Saturation: 91 %
O2 Saturation: 92 %
O2 Saturation: 93 %
O2 Saturation: 95 %
O2 Saturation: 95 %
PCO2 ART: 55.2 mmHg — AB (ref 35.0–45.0)
PCO2 ART: 56.3 mmHg — AB (ref 35.0–45.0)
PCO2 ART: 56.6 mmHg — AB (ref 35.0–45.0)
PCO2 ART: 59.5 mmHg — AB (ref 35.0–45.0)
PCO2 ART: 60.1 mmHg — AB (ref 35.0–45.0)
PH ART: 7.352 (ref 7.350–7.450)
PO2 ART: 82 mmHg (ref 80.0–100.0)
Patient temperature: 97.7
Patient temperature: 98.4
Patient temperature: 98.6
Patient temperature: 98.6
Patient temperature: 98.6
TCO2: 29 mmol/L (ref 0–100)
TCO2: 30 mmol/L (ref 0–100)
TCO2: 30 mmol/L (ref 0–100)
TCO2: 31 mmol/L (ref 0–100)
TCO2: 31 mmol/L (ref 0–100)
TCO2: 32 mmol/L (ref 0–100)
TCO2: 32 mmol/L (ref 0–100)
pCO2 arterial: 57.6 mmHg (ref 35.0–45.0)
pCO2 arterial: 53.9 mmHg — ABNORMAL HIGH (ref 35.0–45.0)
pH, Arterial: 7.279 — ABNORMAL LOW (ref 7.350–7.450)
pH, Arterial: 7.285 — ABNORMAL LOW (ref 7.350–7.450)
pH, Arterial: 7.309 — ABNORMAL LOW (ref 7.350–7.450)
pH, Arterial: 7.312 — ABNORMAL LOW (ref 7.350–7.450)
pH, Arterial: 7.32 — ABNORMAL LOW (ref 7.350–7.450)
pH, Arterial: 7.333 — ABNORMAL LOW (ref 7.350–7.450)
pO2, Arterial: 66 mmHg — ABNORMAL LOW (ref 80.0–100.0)
pO2, Arterial: 67 mmHg — ABNORMAL LOW (ref 80.0–100.0)
pO2, Arterial: 67 mmHg — ABNORMAL LOW (ref 80.0–100.0)
pO2, Arterial: 71 mmHg — ABNORMAL LOW (ref 80.0–100.0)
pO2, Arterial: 72 mmHg — ABNORMAL LOW (ref 80.0–100.0)
pO2, Arterial: 87 mmHg (ref 80.0–100.0)

## 2015-02-24 LAB — BASIC METABOLIC PANEL
ANION GAP: 7 (ref 5–15)
ANION GAP: 7 (ref 5–15)
BUN: 83 mg/dL — ABNORMAL HIGH (ref 6–20)
BUN: 88 mg/dL — ABNORMAL HIGH (ref 6–20)
CALCIUM: 8 mg/dL — AB (ref 8.9–10.3)
CO2: 29 mmol/L (ref 22–32)
CO2: 28 mmol/L (ref 22–32)
Calcium: 7.7 mg/dL — ABNORMAL LOW (ref 8.9–10.3)
Chloride: 121 mmol/L — ABNORMAL HIGH (ref 101–111)
Chloride: 119 mmol/L — ABNORMAL HIGH (ref 101–111)
Creatinine, Ser: 2.07 mg/dL — ABNORMAL HIGH (ref 0.61–1.24)
Creatinine, Ser: 1.97 mg/dL — ABNORMAL HIGH (ref 0.61–1.24)
GFR calc Af Amer: 38 mL/min — ABNORMAL LOW (ref >60)
GFR calc Af Amer: 40 mL/min — ABNORMAL LOW (ref >60)
GFR calc non Af Amer: 33 mL/min — ABNORMAL LOW (ref >60)
GFR calc non Af Amer: 35 mL/min — ABNORMAL LOW (ref >60)
GLUCOSE: 145 mg/dL — AB (ref 65–99)
Glucose, Bld: 241 mg/dL — ABNORMAL HIGH (ref 65–99)
Potassium: 4.7 mmol/L (ref 3.5–5.1)
Potassium: 5 mmol/L (ref 3.5–5.1)
SODIUM: 154 mmol/L — AB (ref 135–145)
Sodium: 157 mmol/L — ABNORMAL HIGH (ref 135–145)

## 2015-02-24 LAB — GLUCOSE, CAPILLARY
GLUCOSE-CAPILLARY: 127 mg/dL — AB (ref 65–99)
GLUCOSE-CAPILLARY: 135 mg/dL — AB (ref 65–99)
GLUCOSE-CAPILLARY: 152 mg/dL — AB (ref 65–99)
Glucose-Capillary: 114 mg/dL — ABNORMAL HIGH (ref 65–99)
Glucose-Capillary: 128 mg/dL — ABNORMAL HIGH (ref 65–99)
Glucose-Capillary: 145 mg/dL — ABNORMAL HIGH (ref 65–99)
Glucose-Capillary: 148 mg/dL — ABNORMAL HIGH (ref 65–99)
Glucose-Capillary: 143 mg/dL — ABNORMAL HIGH (ref 65–99)
Glucose-Capillary: 138 mg/dL — ABNORMAL HIGH (ref 65–99)
Glucose-Capillary: 143 mg/dL — ABNORMAL HIGH (ref 65–99)
Glucose-Capillary: 148 mg/dL — ABNORMAL HIGH (ref 65–99)
Glucose-Capillary: 151 mg/dL — ABNORMAL HIGH (ref 65–99)
Glucose-Capillary: 164 mg/dL — ABNORMAL HIGH (ref 65–99)
Glucose-Capillary: 173 mg/dL — ABNORMAL HIGH (ref 65–99)
Glucose-Capillary: 178 mg/dL — ABNORMAL HIGH (ref 65–99)
Glucose-Capillary: 204 mg/dL — ABNORMAL HIGH (ref 65–99)

## 2015-02-24 LAB — CULTURE, BLOOD (ROUTINE X 2): CULTURE: NO GROWTH

## 2015-02-24 LAB — CBC
HCT: 27 % — ABNORMAL LOW (ref 39.0–52.0)
Hemoglobin: 8.2 g/dL — ABNORMAL LOW (ref 13.0–17.0)
MCH: 26.4 pg (ref 26.0–34.0)
MCHC: 30.4 g/dL (ref 30.0–36.0)
MCV: 86.8 fL (ref 78.0–100.0)
Platelets: 198 10*3/uL (ref 150–400)
RBC: 3.11 MIL/uL — ABNORMAL LOW (ref 4.22–5.81)
RDW: 21.2 % — AB (ref 11.5–15.5)
WBC: 14.5 10*3/uL — AB (ref 4.0–10.5)

## 2015-02-24 MED ORDER — VECURONIUM BROMIDE 10 MG IV SOLR
10.0000 mg | INTRAVENOUS | Status: DC | PRN
  Administered 2015-02-24 – 2015-03-01 (×8): 10 mg via INTRAVENOUS
  Filled 2015-02-24 (×8): qty 10

## 2015-02-24 MED ORDER — FREE WATER
300.0000 mL | Freq: Three times a day (TID) | Status: DC
  Administered 2015-02-24 – 2015-02-25 (×3): 300 mL

## 2015-02-24 MED ORDER — DEXTROSE 5 % IV SOLN
INTRAVENOUS | Status: DC
  Administered 2015-02-24 – 2015-03-01 (×8): via INTRAVENOUS

## 2015-02-24 MED ORDER — METHYLPREDNISOLONE SODIUM SUCC 125 MG IJ SOLR
80.0000 mg | Freq: Three times a day (TID) | INTRAMUSCULAR | Status: DC
  Administered 2015-02-25 – 2015-03-01 (×13): 80 mg via INTRAVENOUS
  Filled 2015-02-24 (×15): qty 1.28

## 2015-02-24 NOTE — Progress Notes (Signed)
ABG    Component Value Date/Time   PHART 7.312* 02/24/2015 1724   PCO2ART 53.9* 02/24/2015 1724   PO2ART 67.0* 02/24/2015 1724   HCO3 27.3* 02/24/2015 1724   TCO2 29 02/24/2015 1724   ACIDBASEDEF 1.3 02/23/2015 0205   O2SAT 91.0 02/24/2015 1724   Called CCM about critical value for PaC02. Increased 02 back to 90% due to pt desating.

## 2015-02-24 NOTE — Progress Notes (Addendum)
PULMONARY / CRITICAL CARE MEDICINE   Name: Derek Anthony MRN: [id number] DOB: 1955-12-08    ADMISSION DATE:  02/18/2015 CONSULTATION DATE:  02/24/2015   REFERRING MD :  EDP  CHIEF COMPLAINT:  Dyspnea, respiratory distress  INITIAL PRESENTATION:  59 y.o. M with recent dx dermatomyositis (s/p 2 cycles of Methotrexate last being 6/13 now on chronic pred), brought to [facility] ED 6/28 for worsening dyspnea and weakness.  Intubated in ED by PCCM and admitted to ICU.   STUDIES:  FOB 6/28 >>> BAL culture - neg, PCP - neg, AFB - neg, Cell Ct - 463 WBC, 65% mono, 27% pmn, 8% lymph, 0% eos, Cytology - neg for CA , Flow Cytometry - non diagnostic, BAL Fungal Culture - neg CXR 6/28 >>> worsening aeration with bilateral opacities.  Concerning for multifocal PNA vs edema.  SIGNIFICANT EVENTS: 6/14 through 6/23 - admitted. 6/28 - re-admitted with worsening hypoxic respiratory failure. 7/1 B chest tubes placed 7/2 - worsening subq air, worsening abg, on nimbex, hypertensive, tachycardic.  High dose steroids initiated    SUBJECTIVE:  No acute change. Seems to be improving slowly. Remains paralyzed on ARDS protocol. Acidosis improved.    VITAL SIGNS: Temp:  [94.5 F (34.7 C)-98.6 F (37 C)] 97.7 F (36.5 C) (07/04 0800) Pulse Rate:  [73-106] 91 (07/04 0700) Resp:  [35] 35 (07/04 0700) BP: (92-148)/(54-71) 116/71 mmHg (07/04 0600) SpO2:  [87 %-97 %] 96 % (07/04 0700) Arterial Line BP: (119-166)/(49-66) 138/65 mmHg (07/04 0700) FiO2 (%):  [90 %-100 %] 90 % (07/04 0446) Weight:  [236 lb 1.8 oz (107.1 kg)] 236 lb 1.8 oz (107.1 kg) (07/04 0300) HEMODYNAMICS: CVP:  [7 mmHg-12 mmHg] 12 mmHg VENTILATOR SETTINGS: Vent Mode:  [-] PRVC FiO2 (%):  [90 %-100 %] 90 % Set Rate:  [35 bmp] 35 bmp Vt Set:  [560 mL] 560 mL PEEP:  [10 cmH20] 10 cmH20 Plateau Pressure:  [32 cmH20-35 cmH20] 34 cmH20 INTAKE / OUTPUT: Intake/Output      07/03 0701 - 07/04 0700 07/04 0701 - 07/05 0700   I.V. (mL/kg) 1750.1 (16.3)     NG/GT 2260    IV Piggyback 100    Total Intake(mL/kg) 4110.1 (38.4)    Urine (mL/kg/hr) 2495 (1)    Stool 50 (0)    Chest Tube 430 (0.2)    Total Output 2975     Net +1135.1            PHYSICAL EXAMINATION: General: WDWN male, critically ill appearing  Neuro: sedated, paralyzed on vent, bis 35  HEENT: [place]/AT. PERRL, sclerae anicteric. Improving sub q air R neck/chest  Cardiovascular: Tachy, regular, no M/R/G.  Lungs: Respirations even and unlabored on vent, rate 35. Scattered rhonchi. Bilat chest tubes.  2/7 airleak L, no airleak R Abdomen: BS x 4, soft, NT/ND.  Musculoskeletal: No gross deformities, mild generalized edema  Skin: Intact, warm.  Gottron papules noted on R hand.   LABS:  CBC  Recent Labs Lab 02/23/15 0445 02/23/15 1400 02/24/15 0400  WBC 12.2* 12.7* 14.5*  HGB 7.8* 7.9* 8.2*  HCT 26.2* 25.7* 27.0*  PLT 182 161 198   Coag's No results for input(s): APTT, INR in the last 168 hours. BMET  Recent Labs Lab 02/23/15 0149 02/23/15 0445 02/24/15 0400  NA 154* 157* 157*  K 4.4 4.3 4.7  CL 117* 119* 121*  CO2 28 29 29   BUN 67* 67* 83*  CREATININE 2.17* 2.16* 2.07*  GLUCOSE 264* 164* 145*   Electrolytes  Recent Labs Lab 02/21/15 1104 02/21/15 2234 02/22/15 0628  02/23/15 0149 02/23/15 0445 02/24/15 0400  CALCIUM  --   --  8.2*  < > 7.9* 7.9* 8.0*  MG 2.8* 2.9* 3.0*  --   --   --   --   PHOS 4.0 4.6 6.1*  --   --   --   --   < > = values in this interval not displayed. Sepsis Markers  Recent Labs Lab 02/18/15 1723 02/23/15 0150  LATICACIDVEN 4.03* 1.5   ABG  Recent Labs Lab 02/23/15 1953 02/24/15 0010 02/24/15 0356  PHART 7.325* 7.320* 7.333*  PCO2ART 57.1* 56.3* 56.6*  PO2ART 65.0* 67.0* 82.0   Liver Enzymes  Recent Labs Lab 02/23/15 0445  AST 14*  ALT 20  ALKPHOS 104  BILITOT 0.5  ALBUMIN 1.6*   Cardiac Enzymes No results for input(s): TROPONINI, PROBNP in the last 168 hours. Glucose  Recent Labs Lab  02/24/15 0206 02/24/15 0302 02/24/15 0355 02/24/15 0501 02/24/15 0559 02/24/15 0650  GLUCAP 135* 143* 143* 151* 138* 148*    Imaging Dg Chest Port 1 View  02/24/2015   CLINICAL DATA:  Respiratory failure.  Pneumonia and ARDS per EMR.  EXAM: PORTABLE CHEST - 1 VIEW  COMPARISON:  02/23/2015  FINDINGS: Endotracheal tube tip is at the clavicular heads. The orogastric tube reaches the stomach at least. Right IJ central line, tip at the upper cavoatrial junction. Bilateral pleural drains in good position.  Small left pneumothorax is again noted, seen at the apex and at the base. A right-sided pneumothorax is not seen. Given differences in rotation, likely stable bilateral chest wall gas.  Hypoventilation with patchy lung opacity, most dense in the left lower lobe where there are clear air bronchograms. These opacities are stable from previous in this patient with history of pneumonia and ARDS.  Stable heart size and mediastinal contours.  IMPRESSION: 1. Stable positioning of tubes and central line. 2. Unchanged small left pneumothorax. 3. Stable airspace disease.   Electronically Signed   By: Marnee SpringJonathon  Watts M.D.   On: 02/24/2015 06:51   Dg Chest Port 1 View  02/23/2015   CLINICAL DATA:  Adult respiratory distress syndrome. Ventilator dependent respiratory failure. Followup left pneumothorax.  EXAM: PORTABLE CHEST - 1 VIEW  COMPARISON:  02/22/2015 and earlier.  FINDINGS: Endotracheal tube tip in satisfactory position projecting approximately 5 cm above the carina. Right jugular central venous catheter tip projects at the expected location of the cavoatrial junction, unchanged. Nasogastric tube courses below the diaphragm into the stomach though its tip is not included on the image.  Interval improvement in the left apical pneumothorax, though a small pneumothorax persists. Subcutaneous emphysema throughout the chest wall and visualize neck, improved. Consolidation with air bronchograms in the left lower lobe,  unchanged. Minimal patchy opacities elsewhere in both lungs, unchanged since yesterday but improved when compared to the examination 5 days ago. No new pulmonary parenchymal abnormality.  IMPRESSION: 1. Support apparatus satisfactory. 2. Interval slight decrease in the left apical pneumothorax since yesterday, though a small pneumothorax persists. 3. Improving subcutaneous emphysema in the chest wall and neck. 4. Stable left lower lobe atelectasis and/or pneumonia and the minimal patchy opacities elsewhere throughout both lungs consistent with the given history of ARDS. No new abnormalities.   Electronically Signed   By: Hulan Saashomas  Lawrence M.D.   On: 02/23/2015 15:50    ASSESSMENT / PLAN:  PULMONARY OETT 6/28 >>> A: Acute hypoxic respiratory failure in setting worsening bilateral  airspace disease. ?HCAP v inflammatory process given hx ARDS Baseline dermatomyositis - s/p 2 cycles methotrexate and on daily prednisone.  Pneumomediastinum, B apical PTX's - now with extensive subq emphysema  P:   ARDS protocol - cont current setting with improved acidosis Cont bilat chest tubes to suction - decrease R to 20cmH20, cont L at 40cmH20 with residual small ptx  VAP bundle. Abx per ID section. High dose steroids -- solumedrol  x 3 days started 7/2.    Follow CXR in am  F/u ABG  Permissive hypercapnia to some degree   Consider diuresis - hold for now with borderline BP and AKI   CARDIOVASCULAR A:  Sinus tach - improved.  Hx HTN  P:  Lopressor PRN. Hold outpatient olmesartan. Monitor CVP  F/u electrolytes  Attempt to dc nimbex to int vec  RENAL A:   AKI - improving slowly.  ? Hypocalcemia - no albumin to correct for. Hyperkalemia - resolved.  Mixed acidosis - lactate normal  Hypernatremia  P:   Increase free water 7/4 F/u chem in am   GASTROINTESTINAL A:   GERD. Nutrition. P:   SUP: Famotidine. TF's  HEMATOLOGIC A:   Chronic anemia - no s/s bleeding.  Hgb drop 9->7 7/3.   NO obvious s/s bleeding.   VTE Prophylaxis. P:  SCD's Hold heparin for now - resume when no further suspicion bleeding  guiac stools  Resume SQ heparin 7/4 Transfuse for hgb <7  INFECTIOUS A:   ?HCAP given hx and CXR findings + being on chronic prednisone v strictly inflammatory  Wbc trending up 7/2 P:   BCx2 6/28 > BAL 6/28 > normal flora RVP 6/29 >> negative U. Strep 6/28 > negative U. Legionella 6/28 > negative PCP smear 6/28 > negative Abx: Vanc, start date 6/28 >>  Abx: Ceftaz, start date 6/28 >> myco 7/2>>>   Wbc trending up but remains on steroids  Empiric PCP coverage with high dose steroids - assess G6PD prior to dapsone start, awaited  ENDOCRINE A:   DM.  Significant hyperglycemia with addition high dose steroids  P:   Insulin gtt   NEUROLOGIC A:   Acute encephalopathy due to sedation. Paralyzed  P:   Sedation:  Fentanyl/versed  RASS goal: -3  Dc nimbex Hold outpatient gabapentin, norco.  RHEUMATOLOGIC A: Baseline dermatomyositis - s/p 2 cycles methotrexate and on daily prednisone. P: Continue Solumedrol - completes 3 days high dose 7/4, then back to  q8h on 7/5 Will follow up with [name] at outpatient, will likely require immunotherapy (cellcept, etc)   Family updated:  Family updated daily at bedside.   Interdisciplinary Family Meeting v Palliative Care Meeting:  Due by: 02/24/15.   Dirk Dress, NP 02/24/2015  8:25 AM Pager: [phone] or [phone]  STAFF NOTE: I, Rory Percy, MD FACP have personally reviewed patient's available data, including medical history, events of note, physical examination and test results as part of my evaluation. I have discussed with resident/NP and other care providers such as pharmacist, RN and RRT. In addition, I personally evaluated patient and elicited key findings of: goal to 70%, less crepitus on examination, less coarse, pcxr improved infiltrates, stable unchanged ptx left, leak less  on left, keep left at 40 cm, suction, rt to 20, may reduce in am, maintain empiric myco, dc vanc, maintain ceftaz, maintain plat less 30, ad d5w, want to liit paralysis when able given risk myopathy from combo steroids/paralysis, dc nimbex, add prn vec, dose solumedral 1  gram today then to 80 q8h, seems to be improving with steroids pulsed, may consider dc myco in am if remains culture negative, will update wife. The patient is critically ill with multiple organ systems failure and requires high complexity decision making for assessment and support, frequent evaluation and titration of therapies, application of advanced monitoring technologies and extensive interpretation of multiple databases.   Critical Care Time devoted to patient care services described in this note is30 Minutes. This time reflects time of care of this signee: Rory Percy, MD FACP. This critical care time does not reflect procedure time, or teaching time or supervisory time of PA/NP/Med student/Med Resident etc but could involve care discussion time. Rest per NP/medical resident whose note is outlined above and that I agree with   Mcarthur Rossetti. Tyson Alias, MD, FACP Pgr: [phone] [facility] 02/24/2015 9:41 AM

## 2015-02-24 NOTE — Progress Notes (Signed)
Spoke with CCM regarding ETT. Advanced ETT 3cm to 27cm at the lips. VS stable. RN to order CXR for confirmation.   Blood pressure 161/69, pulse 107, temperature 98.5 F (36.9 C), temperature source Oral, resp. rate 35, height  (1.854 m), weight 236 lb 1.8 oz (107.1 kg), SpO2 98 %.

## 2015-02-24 NOTE — Progress Notes (Signed)
Lung recruitment maneuver performed, pt tolerated well. sats improved to 94%.

## 2015-02-24 NOTE — Progress Notes (Signed)
Lung recruitment maneuver performed, pt tolerated well. sats improved to 93%.

## 2015-02-25 DIAGNOSIS — J939 Pneumothorax, unspecified: Secondary | ICD-10-CM

## 2015-02-25 DIAGNOSIS — Z87891 Personal history of nicotine dependence: Secondary | ICD-10-CM

## 2015-02-25 DIAGNOSIS — Z9911 Dependence on respirator [ventilator] status: Secondary | ICD-10-CM

## 2015-02-25 LAB — GLUCOSE, CAPILLARY
GLUCOSE-CAPILLARY: 195 mg/dL — AB (ref 65–99)
GLUCOSE-CAPILLARY: 164 mg/dL — AB (ref 65–99)
GLUCOSE-CAPILLARY: 158 mg/dL — AB (ref 65–99)
GLUCOSE-CAPILLARY: 134 mg/dL — AB (ref 65–99)
GLUCOSE-CAPILLARY: 170 mg/dL — AB (ref 65–99)
Glucose-Capillary: 172 mg/dL — ABNORMAL HIGH (ref 65–99)
Glucose-Capillary: 173 mg/dL — ABNORMAL HIGH (ref 65–99)
Glucose-Capillary: 175 mg/dL — ABNORMAL HIGH (ref 65–99)
Glucose-Capillary: 177 mg/dL — ABNORMAL HIGH (ref 65–99)
Glucose-Capillary: 186 mg/dL — ABNORMAL HIGH (ref 65–99)
Glucose-Capillary: 199 mg/dL — ABNORMAL HIGH (ref 65–99)
Glucose-Capillary: 200 mg/dL — ABNORMAL HIGH (ref 65–99)
Glucose-Capillary: 201 mg/dL — ABNORMAL HIGH (ref 65–99)
Glucose-Capillary: 218 mg/dL — ABNORMAL HIGH (ref 65–99)
Glucose-Capillary: 218 mg/dL — ABNORMAL HIGH (ref 65–99)
Glucose-Capillary: 233 mg/dL — ABNORMAL HIGH (ref 65–99)
Glucose-Capillary: 144 mg/dL — ABNORMAL HIGH (ref 65–99)

## 2015-02-25 LAB — CBC
HEMATOCRIT: 27.2 % — AB (ref 39.0–52.0)
HEMOGLOBIN: 8.3 g/dL — AB (ref 13.0–17.0)
MCH: 26.5 pg (ref 26.0–34.0)
MCHC: 30.5 g/dL (ref 30.0–36.0)
MCV: 86.9 fL (ref 78.0–100.0)
Platelets: 210 10*3/uL (ref 150–400)
RBC: 3.13 MIL/uL — ABNORMAL LOW (ref 4.22–5.81)
RDW: 21.4 % — ABNORMAL HIGH (ref 11.5–15.5)
WBC: 17.7 10*3/uL — ABNORMAL HIGH (ref 4.0–10.5)

## 2015-02-25 LAB — BASIC METABOLIC PANEL
Anion gap: 9 (ref 5–15)
BUN: 86 mg/dL — ABNORMAL HIGH (ref 6–20)
CO2: 27 mmol/L (ref 22–32)
Calcium: 7.8 mg/dL — ABNORMAL LOW (ref 8.9–10.3)
Chloride: 119 mmol/L — ABNORMAL HIGH (ref 101–111)
Creatinine, Ser: 1.8 mg/dL — ABNORMAL HIGH (ref 0.61–1.24)
GFR calc Af Amer: 45 mL/min — ABNORMAL LOW (ref >60)
GFR calc non Af Amer: 39 mL/min — ABNORMAL LOW (ref >60)
Glucose, Bld: 164 mg/dL — ABNORMAL HIGH (ref 65–99)
POTASSIUM: 5 mmol/L (ref 3.5–5.1)
SODIUM: 155 mmol/L — AB (ref 135–145)

## 2015-02-25 LAB — POCT I-STAT 3, ART BLOOD GAS (G3+)
Acid-Base Excess: 2 mmol/L (ref 0.0–2.0)
Bicarbonate: 28.2 mEq/L — ABNORMAL HIGH (ref 20.0–24.0)
O2 SAT: 91 %
TCO2: 30 mmol/L (ref 0–100)
pCO2 arterial: 56.7 mmHg — ABNORMAL HIGH (ref 35.0–45.0)
pH, Arterial: 7.305 — ABNORMAL LOW (ref 7.350–7.450)
pO2, Arterial: 67 mmHg — ABNORMAL LOW (ref 80.0–100.0)

## 2015-02-25 LAB — TRIGLYCERIDES: Triglycerides: 351 mg/dL — ABNORMAL HIGH (ref <150)

## 2015-02-25 LAB — LACTATE DEHYDROGENASE
LDH: 646 U/L — AB (ref 98–192)
LDH: 740 U/L — AB (ref 98–192)

## 2015-02-25 MED ORDER — VITAL AF 1.2 CAL PO LIQD
1000.0000 mL | ORAL | Status: DC
  Administered 2015-02-26 – 2015-02-28 (×2): 1000 mL
  Filled 2015-02-25 (×6): qty 1000

## 2015-02-25 MED ORDER — FREE WATER
300.0000 mL | Status: DC
  Administered 2015-02-25 – 2015-03-01 (×24): 300 mL

## 2015-02-25 MED ORDER — FOLIC ACID 1 MG PO TABS
1.0000 mg | ORAL_TABLET | Freq: Every day | ORAL | Status: DC
  Administered 2015-02-26 – 2015-03-01 (×4): 1 mg
  Filled 2015-02-25 (×4): qty 1

## 2015-02-25 MED ORDER — INSULIN GLARGINE 100 UNIT/ML ~~LOC~~ SOLN
30.0000 [IU] | Freq: Two times a day (BID) | SUBCUTANEOUS | Status: DC
  Administered 2015-02-25 – 2015-02-27 (×5): 30 [IU] via SUBCUTANEOUS
  Filled 2015-02-25 (×6): qty 0.3

## 2015-02-25 NOTE — Progress Notes (Signed)
Sputum sample obtained and sent to lab by RT. 

## 2015-02-25 NOTE — Progress Notes (Signed)
Patient's wife has requested for us to use two additional cell phone numbers if we need to contact her: [phone]  Son's contact information: [phone]

## 2015-02-25 NOTE — Progress Notes (Signed)
Wasted less than 5 cc of fentanyl in the room sink, witnessed by Donell BeersAngel Lewis.

## 2015-02-25 NOTE — Consult Note (Signed)
[facility]    Date of Admission:  02/18/2015   Day 8 ceftazidime        Day for micafungin               Reason for Consult: Possible healthcare associated pneumonia    Referring Physician: Dr. Billy Fischer  Principal Problem:   Acute respiratory failure with hypoxia Active Problems:   Dermatomyositis   Pulmonary infiltrates   Healthcare-associated pneumonia   Pneumonitis   Arterial hypotension   Pneumothorax   Acute respiratory failure   Encounter for central line placement   Encounter for feeding tube placement   ARDS (adult respiratory distress syndrome)   . antiseptic oral rinse  7 mL Mouth Rinse QID  . artificial tears  1 application Both Eyes 3 times per day  . cefTAZidime (FORTAZ)  IV  1 g Intravenous Q12H  . chlorhexidine  15 mL Mouth Rinse BID  . famotidine (PEPCID) IV  20 mg Intravenous Q12H  . fentaNYL (SUBLIMAZE) injection  50 mcg Intravenous Once  . [START ON 02/26/2015] folic acid  1 mg Per Tube Daily  . free water  300 mL Per Tube Q4H  . heparin  5,000 Units Subcutaneous 3 times per day  . insulin glargine  30 Units Subcutaneous BID  . methylPREDNISolone (SOLU-MEDROL) injection  80 mg Intravenous 3 times per day  . micafungin [facility]) IV  100 mg Intravenous Daily  . midazolam  2 mg Intravenous Once    Recommendations: 1. Discontinue ceftazidime and micafungin and observe off of anabiotic for now 2. Check serum LDH 3. Agree with repeat BAL and pneumocystis smear 4. Agree with diuresis   Assessment: So far there is been no proven evidence of pneumonia. He has received a full course of therapy for possible healthcare associated pneumonia and I will stop ceftazidime and micafungin now. Repeat BAL is pending. I will check a serum LDH in addition to awaiting repeat pneumocystis smear. I doubt that he has acute pneumocystis pneumonia. I suspect that some of his current infiltrates are related to volume overload and  edema. I will follow with you.    HPI: Derek Anthony is a 59 y.o. male was diagnosed with dermatomyositis in May. He was treated with methotrexate and has been on prednisone recently. He may have received some outpatient Cytoxan as well. He was hospitalized from 02/06/2015 to 02/13/2015 with hypoxia and pulmonary infiltrates after failing to improve on outpatient therapy for possible community-acquired pneumonia. He was treated with empiric anabiotic 7 underwent bronchoscopy and BAL. All BAL stains were negative for bacteria, pneumocystis, AFB and fungus. Cultures were negative. Anabiotic's were stopped before discharge. He was readmitted on 628 with a fever of 102.6, recurrent hypoxia and infiltrates. He was intubated and started on empiric ceftazidime and vancomycin. Repeat BAL again showed negative stains for bacteria, pneumocystis, AFB and fungus. Cultures were negative again. His respiratory virus panel was negative as well. Vancomycin was discontinued and micafungin was added to ceftazidime. He remains intubated and sedated. His weight is up 16 kg. He defervesced promptly after admission and increase in his sterile dose.   Review of Systems: Review of systems not obtained due to patient factors.  Past Medical History  Diagnosis Date  . Diabetes mellitus without complication   . Hypertension   . GERD (gastroesophageal reflux disease)   . Anemia   . Sickle cell trait   . Gout   .  Wears glasses   . Muscle pain   . Dermatomyositis     History  Substance Use Topics  . Smoking status: Former Smoker -- 1.00 packs/day for 20 years    Types: Cigarettes    Quit date: 10/29/1992  . Smokeless tobacco: Not on file  . Alcohol Use: No    Family History  Problem Relation Age of Onset  . Hypertension Mother   . Diabetes Mother   . Hypertension Father    Allergies  Allergen Reactions  . Sulfa Antibiotics Itching and Rash    Burning also  . Penicillins Itching and Rash     OBJECTIVE: Blood pressure 131/73, pulse 107, temperature 98.6 F (37 C), temperature source Oral, resp. rate 37, height  (1.854 m), weight 247 lb 5.7 oz (112.2 kg), SpO2 92 %. General: He is sedated and unresponsive on the ventilator Skin: No rash Lungs: Clear anteriorly. He has extensive subcutaneous air without crepitus over the anterior chest. Cor: Regular S1 and S2 with no murmur heard Abdomen: Soft without palpable masses Extremities: Healed scar on right knee  Lab Results Lab Results  Component Value Date   WBC 17.7* 02/25/2015   HGB 8.3* 02/25/2015   HCT 27.2* 02/25/2015   MCV 86.9 02/25/2015   PLT 210 02/25/2015    Lab Results  Component Value Date   CREATININE 1.80* 02/25/2015   BUN 86* 02/25/2015   NA 155* 02/25/2015   K 5.0 02/25/2015   CL 119* 02/25/2015   CO2 27 02/25/2015    Lab Results  Component Value Date   ALT 20 02/23/2015   AST 14* 02/23/2015   ALKPHOS 104 02/23/2015   BILITOT 0.5 02/23/2015     Microbiology: Recent Results (from the past 240 hour(s))  Blood culture (routine x 2)     Status: None   Collection Time: 02/18/15  5:00 PM  Result Value Ref Range Status   Specimen Description BLOOD RIGHT ARM  Final   Special Requests BOTTLES DRAWN AEROBIC AND ANAEROBIC 5CC  Final   Culture NO GROWTH 5 DAYS  Final   Report Status 02/23/2015 FINAL  Final  Culture, blood (routine x 2)     Status: None   Collection Time: 02/18/15  5:39 PM  Result Value Ref Range Status   Specimen Description BLOOD RIGHT HAND  Final   Special Requests BOTTLES DRAWN AEROBIC ONLY 4CC  Final   Culture NO GROWTH 5 DAYS  Final   Report Status 02/24/2015 FINAL  Final  MRSA PCR Screening     Status: Abnormal   Collection Time: 02/18/15  8:45 PM  Result Value Ref Range Status   MRSA by PCR POSITIVE (A) NEGATIVE Final    Comment:        The GeneXpert MRSA Assay (FDA approved for NASAL specimens only), is one component of a comprehensive MRSA  colonization surveillance program. It is not intended to diagnose MRSA infection nor to guide or monitor treatment for MRSA infections. RESULT CALLED TO, READ BACK BY AND VERIFIED WITH: SMITH,A RN [phone] 02/19/15 MITCHELL,L   Pneumocystis smear by DFA     Status: None   Collection Time: 02/18/15  9:15 PM  Result Value Ref Range Status   Specimen Source-PJSRC BRONCHIAL ALVEOLAR LAVAGE  Final   Pneumocystis jiroveci Ag NEGATIVE  Final    Comment: Performed at [facility]  Culture, respiratory (NON-Expectorated)     Status: None   Collection Time: 02/18/15  9:15 PM  Result Value  Ref Range Status   Specimen Description BRONCHIAL ALVEOLAR LAVAGE  Final   Special Requests NONE  Final   Gram Stain   Final    RARE WBC PRESENT, PREDOMINANTLY MONONUCLEAR RARE SQUAMOUS EPITHELIAL CELLS PRESENT NO ORGANISMS SEEN Performed at [facility]    Culture   Final    Non-Pathogenic Oropharyngeal-type Flora Isolated. Performed at [facility]    Report Status 02/21/2015 FINAL  Final  AFB culture with smear     Status: None (Preliminary result)   Collection Time: 02/18/15  9:15 PM  Result Value Ref Range Status   Specimen Description BRONCHIAL ALVEOLAR LAVAGE  Final   Special Requests NONE  Final   Acid Fast Smear   Final    NO ACID FAST BACILLI SEEN Performed at [facility]    Culture   Final    CULTURE WILL BE EXAMINED FOR 6 WEEKS BEFORE ISSUING A FINAL REPORT Performed at [facility]    Report Status PENDING  Incomplete  Respiratory virus panel     Status: None   Collection Time: 02/18/15  9:15 PM  Result Value Ref Range Status   Source - RVPAN BRONCHIAL ALVEOLAR LAVAGE  Corrected   Respiratory Syncytial Virus A Negative Negative Final   Respiratory Syncytial Virus B Negative Negative Final   Influenza A Negative Negative Final   Influenza B Negative Negative Final   Parainfluenza 1 Negative Negative Final   Parainfluenza 2 Negative  Negative Final   Parainfluenza 3 Negative Negative Final   Metapneumovirus Negative Negative Final   Rhinovirus Negative Negative Final   Adenovirus Negative Negative Final    Comment: (NOTE) Performed At: [facility] [place] [address] [place], [place] [id number] Mila HomerHancock William F MD WU:[id number]:[phone]   Fungus Culture with Smear     Status: None (Preliminary result)   Collection Time: 02/18/15  9:15 PM  Result Value Ref Range Status   Specimen Description BRONCHIAL ALVEOLAR LAVAGE  Final   Special Requests NONE  Final   Fungal Smear   Final    NO YEAST OR FUNGAL ELEMENTS SEEN Performed at [facility]    Culture   Final    CULTURE IN PROGRESS FOR FOUR WEEKS Performed at [facility]    Report Status PENDING  Incomplete  Blood culture (routine x 2)     Status: None   Collection Time: 02/18/15  9:22 PM  Result Value Ref Range Status   Specimen Description BLOOD LEFT HAND  Final   Special Requests BOTTLES DRAWN AEROBIC ONLY [facility]7CC  Final   Culture NO GROWTH 5 DAYS  Final   Report Status 02/23/2015 FINAL  Final    Cliffton AstersJohn Gladie Gravette, MD [facility] [phone] pager   [phone] cell 02/25/2015, 12:33 PM

## 2015-02-25 NOTE — Progress Notes (Signed)
PULMONARY / CRITICAL CARE MEDICINE   Name: Derek Anthony MRN: [id number] DOB: Sep 18, 1955    ADMISSION DATE:  02/18/2015 CONSULTATION DATE:  02/25/2015   REFERRING MD :  EDP  CHIEF COMPLAINT:  Dyspnea, respiratory distress  INITIAL PRESENTATION:  59 y.o. M with recent dx dermatomyositis (s/p 2 cycles of Methotrexate last being 6/13 now on chronic pred), brought to [facility] ED 6/28 for worsening dyspnea and weakness.  Intubated in ED by PCCM and admitted to ICU.   STUDIES:  FOB 6/28 >>> BAL culture - neg, PCP - neg, AFB - neg, Cell Ct - 463 WBC, 65% mono, 27% pmn, 8% lymph, 0% eos, Cytology - neg for CA , Flow Cytometry - non diagnostic, BAL Fungal Culture - neg CXR 6/28 >>> worsening aeration with bilateral opacities.  Concerning for multifocal PNA vs edema.  SIGNIFICANT EVENTS: 6/14 through 6/23 - admitted. 6/28 - re-admitted with worsening hypoxic respiratory failure. 7/1 B chest tubes placed 7/2 - worsening subq air, worsening abg, on nimbex, hypertensive, tachycardic.  High dose steroids initiated  SUBJECTIVE:  Hemodynamically stable. No significant events. ABGs improving. SQ emphysema may be improving. No significant air appreciated via water sealed chest tubes.   VITAL SIGNS: Temp:  [97.9 F (36.6 C)-98.6 F (37 C)] 98.6 F (37 C) (07/05 0700) Pulse Rate:  [91-107] 107 (07/05 1218) Resp:  [25-37] 37 (07/05 1218) BP: (117-163)/(64-80) 131/73 mmHg (07/05 1218) SpO2:  [86 %-98 %] 92 % (07/05 1218) Arterial Line BP: (124-161)/(60-68) 155/64 mmHg (07/05 1100) FiO2 (%):  [60 %-90 %] 80 % (07/05 1218) Weight:  [247 lb 5.7 oz (112.2 kg)] 247 lb 5.7 oz (112.2 kg) (07/05 0500) HEMODYNAMICS: CVP:  [10 mmHg-13 mmHg] 10 mmHg VENTILATOR SETTINGS: Vent Mode:  [-] PRVC FiO2 (%):  [60 %-90 %] 80 % Set Rate:  [35 bmp] 35 bmp Vt Set:  [480 mL-560 mL] 480 mL PEEP:  [10 cmH20-14 cmH20] 14 cmH20 Plateau Pressure:  [26 cmH20-37 cmH20] 32 cmH20 INTAKE / OUTPUT: Intake/Output      07/04 0701 -  07/05 0700 07/05 0701 - 07/06 0700   I.V. (mL/kg) 3325.8 (29.6) 272.8 (2.4)   NG/GT 2430 210   IV Piggyback 200 400   Total Intake(mL/kg) 5955.8 (53.1) 882.8 (7.9)   Urine (mL/kg/hr) 2930 (1.1)    Stool 25 (0)    Chest Tube 34 (0) 0 (0)   Total Output 2989 0   Net +2966.8 +882.8          PHYSICAL EXAMINATION: General: WDWN male, critically ill appearing  HEENT: [place]/AT. PERRL. Extensive SQ air R neck/chest.  Cardiovascular: Tachy, regular, no M/R/G.  Lungs: Respirations even and unlabored on vent. Diminished throughout. Bilat chest tubes.  Abdomen: BS present, soft, NT/ND.  Musculoskeletal: No gross deformities Skin: Intact, warm. Significant nonpitting edema in bilateral UE Neuro: sedated, paralyzed on vent  LABS:  CBC  Recent Labs Lab 02/23/15 1400 02/24/15 0400 02/25/15 0320  WBC 12.7* 14.5* 17.7*  HGB 7.9* 8.2* 8.3*  HCT 25.7* 27.0* 27.2*  PLT 161 198 210   Coag's No results for input(s): APTT, INR in the last 168 hours. BMET  Recent Labs Lab 02/24/15 0400 02/24/15 1546 02/25/15 0320  NA 157* 154* 155*  K 4.7 5.0 5.0  CL 121* 119* 119*  CO2 29 28 27   BUN 83* 88* 86*  CREATININE 2.07* 1.97* 1.80*  GLUCOSE 145* 241* 164*   Electrolytes  Recent Labs Lab 02/21/15 1104 02/21/15 2234 02/22/15 0628  02/24/15 0400 02/24/15 1546  02/25/15 0320  CALCIUM  --   --  8.2*  < > 8.0* 7.7* 7.8*  MG 2.8* 2.9* 3.0*  --   --   --   --   PHOS 4.0 4.6 6.1*  --   --   --   --   < > = values in this interval not displayed. Sepsis Markers  Recent Labs Lab 02/18/15 1723 02/23/15 0150  LATICACIDVEN 4.03* 1.5   ABG  Recent Labs Lab 02/24/15 1931 02/24/15 2334 02/25/15 0410  PHART 7.285* 7.279* 7.305*  PCO2ART 59.5* 60.1* 56.7*  PO2ART 66.0* 87.0 67.0*   Liver Enzymes  Recent Labs Lab 02/23/15 0445  AST 14*  ALT 20  ALKPHOS 104  BILITOT 0.5  ALBUMIN 1.6*   Cardiac Enzymes No results for input(s): TROPONINI, PROBNP in the last 168  hours. Glucose  Recent Labs Lab 02/25/15 0218 02/25/15 0321 02/25/15 0425 02/25/15 0518 02/25/15 0626 02/25/15 0855  GLUCAP 172* 164* 158* 134* 144* 170*    Imaging Dg Chest Port 1 View  02/24/2015   CLINICAL DATA:  Followup endotracheal tube advancement  EXAM: PORTABLE CHEST - 1 VIEW  COMPARISON:  Chest x-ray from the same day at 8:04 p.m.  FINDINGS: Improved positioning of endotracheal tube, tip now at the clavicular heads. Right IJ central line tip remains at the upper right atrial level. The orogastric tube reaches the stomach at least.  Bilateral thoracic drains are in place. Small left pneumothorax is unchanged, less than 5%. Chest wall subcutaneous gas is stable.  Stable cardiomegaly. Stable bilateral airspace disease, most dense in the left lower lobe.  IMPRESSION: 1. The endotracheal tube is in good position after advancement. 2. Otherwise, no change from 1 hour prior.   Electronically Signed   By: Marnee Spring M.D.   On: 02/24/2015 21:24   Dg Chest Port 1 View  02/24/2015   CLINICAL DATA:  Bilateral chest tubes. Possible increased subcutaneous air.  EXAM: PORTABLE CHEST - 1 VIEW  COMPARISON:  Earlier today at 0500 hours  FINDINGS: Patient rotated left. Nasogastric tube extends beyond the inferior aspect of the film. Endotracheal tube has been retracted and terminates 9.6 cm above the carina. Right internal jugular line tip at low SVC or high right atrium. Small bore chest tubes remain in place. The left-sided tube is again positioned minimally outside the chest wall. Tiny left apical pneumothorax is similar to slightly decreased, less than 5%. Subcutaneous emphysema is similar. Left base airspace disease is not significantly changed.  IMPRESSION: 1. Endotracheal tube has been retracted and terminates 9.6 cm above carina. Recommend advancement 4-5 cm. These results were called by telephone at the time of interpretation on 02/24/2015 at 8:33 pm to the patients nurse who verbally  acknowledged these results. 2. Similar left base airspace disease. 3. Similar to slight improvement in tiny left apical pneumothorax with small bore bilateral chest tubes in place.   Electronically Signed   By: Jeronimo Greaves M.D.   On: 02/24/2015 20:34    ASSESSMENT / PLAN:  PULMONARY OETT 6/28 >>> A: Acute hypoxic respiratory failure in setting worsening bilateral airspace disease. ?HCAP v inflammatory process given hx ARDS Baseline dermatomyositis - s/p 2 cycles methotrexate and on daily prednisone.  Pneumomediastinum, B apical PTX's - extensive subq emphysema  P:   ARDS protocol - cont current setting with improved acidosis Bilat chest tubes in place, Both to water seal  VAP bundle. Abx per ID section. Solumedrol  TID -- (started 7/2.) CXR in am  F/u Daily ABG  Permissive hypercapnia to some degree   Consider diuresis - holding for now Repeat BAL culture and PCP smear  CARDIOVASCULAR A:  Sinus tach - improved.  Hx HTN  P:  Lopressor PRN. Hold outpatient olmesartan. Monitor CVP  F/u electrolytes  Cont high dose sedation while on nimbex  D/c propofol if hypotension   RENAL A:   AKI  ? Hypocalcemia - (9.8 corrected) Hyperkalemia - resolved.  Mixed acidosis - lactate normal  Hypernatremia  P:   Dc HCO3 gtt  Continue electrolyte correction w/ free water BMP in AM  GASTROINTESTINAL A:   GERD. Nutrition. P:   SUP: Famotidine. TF -- reduced by 1/2 today (7/5)  HEMATOLOGIC A:   Chronic anemia - no s/s bleeding.  Hgb drop 9->7 7/3.  NO obvious s/s bleeding.   VTE Prophylaxis. P:  SCD's Hold heparin for now - resume when no further suspicion bleeding  guiac stools  Hgb appears stable at this time. Transfuse for hgb <7  INFECTIOUS A:   ?HCAP given hx and CXR findings + being on chronic prednisone v strictly inflammatory  Wbc trending up 7/2 P:   BCx2 6/28 > neg to date BAL 6/28 > normal flora BAL 7/5 > PENDING RVP 6/29 >> negative U. Strep 6/28 >  negative U. Legionella 6/28 > negative PCP smear 6/28 > negative PCP smear 7/5 > PENDING Abx: Vanc, start date 6/28 >> DC 7/4 Abx: Ceftaz, start date 6/28 >>  myco 7/2>>>   Infectious Disease consulted: appreciate input. Wbc trending up but remains on steroids  Empiric PCP coverage with high dose steroids - assess G6PD prior to dapsone start, awaited  ENDOCRINE A:   DM.  Significant hyperglycemia with addition high dose steroids  P:   SSI, TF coverage  Add Lantus 30u BID Insulin gtt   NEUROLOGIC A:   Acute encephalopathy due to sedation. Paralyzed  P:   Sedation:  Propofol gtt / Fentanyl/versed  RASS goal: -5 while on nimbex  Hold outpatient gabapentin, norco.  RHEUMATOLOGIC A: Baseline dermatomyositis - s/p 2 cycles methotrexate and on daily prednisone. P: Continue Solumedrol. Will follow up with Dr. Nickola MajorHawkes at outpatient, will likely require immunotherapy (cellcept, etc)   Family updated:  Family updated daily at bedside.   Interdisciplinary Family Meeting v Palliative Care Meeting:  Due by: 02/24/15.   Kathee DeltonIan D McKeag, MD,MS,  PGY2 02/25/2015 12:34 PM  PCCM ATTENDING: I have reviewed pt's initial presentation, consultants notes and hospital database in detail.  The above assessment and plan was formulated under my direction.  In summary: Severe pneumonitis/ARDS - presumably related to dermatomyositis He has responded minimally to high dose steroids We have asked ID to eval to ensure there are no opportunistic infections to be considered Will consider cyclophosphamide vs rituximab 7/06 Wife and son updated in detail   40 minutes of independent CCM time was provided by me   Billy Fischeravid Amilyah Nack, MD;  PCCM service; Mobile [phone]

## 2015-02-25 NOTE — Progress Notes (Signed)
Nutrition Follow-up  INTERVENTION:   Once glucose is better controlled, recommend increase Vital AF 1.2 to goal rate of 80 ml/h (1920 ml/day) to provide 2304 kcals, 144 gm protein, 1557 ml free water daily.  NUTRITION DIAGNOSIS:  Inadequate oral intake related to inability to eat as evidenced by NPO status.  Ongoing   GOAL:  Patient will meet greater than or equal to 90% of their needs  Unmet   MONITOR:  Vent status, TF tolerance, Weight trends, Labs, I & O's   ASSESSMENT:  59 y.o. M with recent dx dermatomyositis, [facility] for worsening dyspnea and weakness. Intubated in ED by PCCM and admitted to ICU.  Patient is currently receiving Vital AF 1.2 via OGT at 35 ml/h (840 ml/day) to provide 1008 kcals, 63 gm protein, 681 ml free water daily. Rate of TF decreased by half today due to elevated glucose. Lantus has been started. Currently not meeting nutrition needs.  Patient is currently intubated on ventilator support; propofol has been d/c'ed.  MV: 17.7 L/min Temp (24hrs), Avg:98.4 F (36.9 C), Min:97.9 F (36.6 C), Max:98.9 F (37.2 C)  Propofol: none ml/hr  Height:  Ht Readings from Last 1 Encounters:  02/05/15 6\' 1"  (1.854 m)    Weight:  Wt Readings from Last 1 Encounters:  02/25/15 247 lb 5.7 oz (112.2 kg)    Ideal Body Weight:  83.6 kg  Estimated Nutritional Needs:  Kcal:  2357  Protein:  120-140 gm  Fluid:  2.4 L  Skin:  Wound (see comment) (small puncture wound on buttocks)  Diet Order:   NPO  EDUCATION NEEDS:  No education needs identified at this time   Intake/Output Summary (Last 24 hours) at 02/25/15 1619 Last data filed at 02/25/15 1043  Gross per 24 hour  Intake 5268.57 ml  Output   1734 ml  Net 3534.57 ml    Last BM:  7/3   Joaquin CourtsKimberly Harris, RD, LDN, CNSC Pager [phone] After Hours Pager [phone]

## 2015-02-26 ENCOUNTER — Inpatient Hospital Stay (HOSPITAL_COMMUNITY): Payer: 59

## 2015-02-26 DIAGNOSIS — J982 Interstitial emphysema: Secondary | ICD-10-CM

## 2015-02-26 LAB — GLUCOSE, CAPILLARY
GLUCOSE-CAPILLARY: 139 mg/dL — AB (ref 65–99)
GLUCOSE-CAPILLARY: 141 mg/dL — AB (ref 65–99)
GLUCOSE-CAPILLARY: 145 mg/dL — AB (ref 65–99)
GLUCOSE-CAPILLARY: 147 mg/dL — AB (ref 65–99)
GLUCOSE-CAPILLARY: 148 mg/dL — AB (ref 65–99)
GLUCOSE-CAPILLARY: 150 mg/dL — AB (ref 65–99)
GLUCOSE-CAPILLARY: 152 mg/dL — AB (ref 65–99)
GLUCOSE-CAPILLARY: 154 mg/dL — AB (ref 65–99)
GLUCOSE-CAPILLARY: 155 mg/dL — AB (ref 65–99)
GLUCOSE-CAPILLARY: 155 mg/dL — AB (ref 65–99)
GLUCOSE-CAPILLARY: 156 mg/dL — AB (ref 65–99)
GLUCOSE-CAPILLARY: 157 mg/dL — AB (ref 65–99)
GLUCOSE-CAPILLARY: 164 mg/dL — AB (ref 65–99)
Glucose-Capillary: 122 mg/dL — ABNORMAL HIGH (ref 65–99)
Glucose-Capillary: 130 mg/dL — ABNORMAL HIGH (ref 65–99)
Glucose-Capillary: 136 mg/dL — ABNORMAL HIGH (ref 65–99)
Glucose-Capillary: 143 mg/dL — ABNORMAL HIGH (ref 65–99)
Glucose-Capillary: 148 mg/dL — ABNORMAL HIGH (ref 65–99)
Glucose-Capillary: 151 mg/dL — ABNORMAL HIGH (ref 65–99)
Glucose-Capillary: 162 mg/dL — ABNORMAL HIGH (ref 65–99)
Glucose-Capillary: 166 mg/dL — ABNORMAL HIGH (ref 65–99)
Glucose-Capillary: 143 mg/dL — ABNORMAL HIGH (ref 65–99)
Glucose-Capillary: 157 mg/dL — ABNORMAL HIGH (ref 65–99)
Glucose-Capillary: 160 mg/dL — ABNORMAL HIGH (ref 65–99)
Glucose-Capillary: 164 mg/dL — ABNORMAL HIGH (ref 65–99)
Glucose-Capillary: 169 mg/dL — ABNORMAL HIGH (ref 65–99)

## 2015-02-26 LAB — BASIC METABOLIC PANEL
ANION GAP: 6 (ref 5–15)
Anion gap: 5 (ref 5–15)
BUN: 78 mg/dL — ABNORMAL HIGH (ref 6–20)
BUN: 77 mg/dL — AB (ref 6–20)
CALCIUM: 7.9 mg/dL — AB (ref 8.9–10.3)
CHLORIDE: 118 mmol/L — AB (ref 101–111)
CO2: 28 mmol/L (ref 22–32)
CO2: 28 mmol/L (ref 22–32)
CREATININE: 1.61 mg/dL — AB (ref 0.61–1.24)
CREATININE: 1.53 mg/dL — AB (ref 0.61–1.24)
Calcium: 7.9 mg/dL — ABNORMAL LOW (ref 8.9–10.3)
Chloride: 119 mmol/L — ABNORMAL HIGH (ref 101–111)
GFR calc Af Amer: 52 mL/min — ABNORMAL LOW (ref >60)
GFR calc Af Amer: 55 mL/min — ABNORMAL LOW (ref >60)
GFR calc non Af Amer: 47 mL/min — ABNORMAL LOW (ref >60)
GFR, EST NON AFRICAN AMERICAN: 45 mL/min — AB (ref >60)
Glucose, Bld: 177 mg/dL — ABNORMAL HIGH (ref 65–99)
Glucose, Bld: 305 mg/dL — ABNORMAL HIGH (ref 65–99)
Potassium: 6 mmol/L — ABNORMAL HIGH (ref 3.5–5.1)
Potassium: 6.3 mmol/L (ref 3.5–5.1)
Sodium: 152 mmol/L — ABNORMAL HIGH (ref 135–145)
Sodium: 152 mmol/L — ABNORMAL HIGH (ref 135–145)

## 2015-02-26 LAB — CBC
HEMATOCRIT: 28.8 % — AB (ref 39.0–52.0)
Hemoglobin: 8.5 g/dL — ABNORMAL LOW (ref 13.0–17.0)
MCH: 26 pg (ref 26.0–34.0)
MCHC: 29.5 g/dL — AB (ref 30.0–36.0)
MCV: 88.1 fL (ref 78.0–100.0)
Platelets: 240 10*3/uL (ref 150–400)
RBC: 3.27 MIL/uL — ABNORMAL LOW (ref 4.22–5.81)
RDW: 21.4 % — AB (ref 11.5–15.5)
WBC: 18.8 10*3/uL — ABNORMAL HIGH (ref 4.0–10.5)

## 2015-02-26 LAB — POCT I-STAT 3, ART BLOOD GAS (G3+)
Bicarbonate: 27 mEq/L — ABNORMAL HIGH (ref 20.0–24.0)
O2 SAT: 90 %
PCO2 ART: 56.6 mmHg — AB (ref 35.0–45.0)
PH ART: 7.284 — AB (ref 7.350–7.450)
Patient temperature: 97.9
TCO2: 29 mmol/L (ref 0–100)
pO2, Arterial: 67 mmHg — ABNORMAL LOW (ref 80.0–100.0)

## 2015-02-26 LAB — POTASSIUM: Potassium: 6.3 mmol/L (ref 3.5–5.1)

## 2015-02-26 MED ORDER — FAMOTIDINE 40 MG/5ML PO SUSR
20.0000 mg | Freq: Two times a day (BID) | ORAL | Status: DC
  Administered 2015-02-26 – 2015-03-01 (×7): 20 mg
  Filled 2015-02-26 (×8): qty 2.5

## 2015-02-26 MED ORDER — LIDOCAINE HCL (PF) 1 % IJ SOLN
INTRAMUSCULAR | Status: AC
Start: 2015-02-26 — End: 2015-02-26
  Administered 2015-02-26: 10:00:00
  Filled 2015-02-26: qty 10

## 2015-02-26 MED ORDER — INSULIN ASPART 100 UNIT/ML ~~LOC~~ SOLN
2.0000 [IU] | SUBCUTANEOUS | Status: DC
  Administered 2015-02-26: 6 [IU] via SUBCUTANEOUS

## 2015-02-26 MED ORDER — INSULIN ASPART 100 UNIT/ML ~~LOC~~ SOLN
4.0000 [IU] | Freq: Once | SUBCUTANEOUS | Status: AC
  Administered 2015-02-26: 4 [IU] via SUBCUTANEOUS

## 2015-02-26 MED ORDER — INSULIN ASPART 100 UNIT/ML IV SOLN
10.0000 [IU] | Freq: Once | INTRAVENOUS | Status: AC
  Administered 2015-02-26: 10 [IU] via INTRAVENOUS

## 2015-02-26 MED ORDER — METOPROLOL TARTRATE 1 MG/ML IV SOLN
10.0000 mg | Freq: Once | INTRAVENOUS | Status: AC
  Administered 2015-02-26: 10 mg via INTRAVENOUS
  Filled 2015-02-26: qty 10

## 2015-02-26 MED ORDER — SODIUM POLYSTYRENE SULFONATE 15 GM/60ML PO SUSP
15.0000 g | Freq: Once | ORAL | Status: AC
  Administered 2015-02-26: 15 g via ORAL
  Filled 2015-02-26: qty 60

## 2015-02-26 MED ORDER — SODIUM CHLORIDE 0.9 % IV SOLN
INTRAVENOUS | Status: DC
  Administered 2015-02-26: 2.4 [IU]/h via INTRAVENOUS
  Filled 2015-02-26 (×3): qty 2.5

## 2015-02-26 NOTE — Progress Notes (Signed)
Called E-link to notify MD Sommers of the radiologist's report about the patient's L chest tube being almost completely out (increase in sub Q air) to see if they wanted someone in house to come evaluate patient present moment.  Chest tube insertion site was reinforced, tube secured.  Still no evidence of air leak in the water chamber.  Currently patient is stable: HR 108, BP 132/ 66 (arterial line), RR 35, and O2 sats 88% (FIO2= 70, PEEP= 15, RR=35, TV= 480).

## 2015-02-26 NOTE — Progress Notes (Addendum)
Notified MD IAnne Hahn. McKay of patient's 4 beats on non-sustained v-tach around 2:20 this morning.  No orders received for interventions at this time.  Patient's vitals are stable: HR 106, O2 91, arterial line BP 137/67, and compliant with the vent at RR 35. Will continue to monitor and assess.

## 2015-02-26 NOTE — Progress Notes (Signed)
CRITICAL VALUE ALERT  Critical value received:  K+ 6.3  Date of notification:  02/26/15  Time of notification:  8:36  Critical value read back:Yes.    Nurse who received alert:  Wende NeighborsSara Corian Handley  MD notified (1st page):  MD Mungal  Time of first page:  8:36  MD notified (2nd page):  Time of second page:  Responding MD:  MD Dema SeverinMungal  Time MD responded:  8:36

## 2015-02-26 NOTE — Progress Notes (Signed)
Dr. Elmon KirschnerWatt from radiology called to say that the patient's left chest tube is almost completely out, but that the pneuomothorax is stable.

## 2015-02-26 NOTE — Progress Notes (Signed)
Dr Sung AmabileSimonds informed pf  chest xray results of left chest tube in costal left pneumo called by radiology

## 2015-02-26 NOTE — Progress Notes (Signed)
CRITICAL VALUE ALERT  Critical value received:  K+ 6.3 Date of notification:  02/26/15  Time of notification:  10:50  Critical value read back:Yes.    Nurse who received alert:  Wende NeighborsSara Mashell Sieben  MD notified (1st page):  MD Mungal  Time of first page:  10:50  MD notified (2nd page):  Time of second page:  Responding MD:  MD Mungal; orders for 10 units of novolog + restarting insulin gtt  Time MD responded: 10:50

## 2015-02-26 NOTE — Progress Notes (Signed)
Patient was transported to CT and remained stable throughout procedure with no complications. Respiratory therapist will continue to monitor.

## 2015-02-26 NOTE — Progress Notes (Signed)
PULMONARY / CRITICAL CARE MEDICINE   Name: Derek Anthony MRN: [id number] DOB: 10-19-1955    ADMISSION DATE:  02/18/2015 CONSULTATION DATE:  02/26/2015   REFERRING MD :  EDP  CHIEF COMPLAINT:  Dyspnea, respiratory distress  INITIAL PRESENTATION:  59 y.o. M with recent dx dermatomyositis (s/p 2 cycles of Methotrexate last being 6/13 now on chronic pred), brought to [facility] ED 6/28 for worsening dyspnea and weakness.  Intubated in ED by PCCM and admitted to ICU.   STUDIES:  FOB 6/28 >>> BAL culture - neg, PCP - neg, AFB - neg, Cell Ct - 463 WBC, 65% mono, 27% pmn, 8% lymph, 0% eos, Cytology - neg for CA , Flow Cytometry - non diagnostic, BAL Fungal Culture - neg CXR 6/28 >>> worsening aeration with bilateral opacities.  Concerning for multifocal PNA vs edema.  SIGNIFICANT EVENTS: 6/14 through 6/23 - admitted. 6/28 - re-admitted with worsening hypoxic respiratory failure. 7/1 B chest tubes placed 7/2: worsening subq air, worsening abg, on nimbex, hypertensive, tachycardic.  High dose steroids initiated 7/6: Lt pig tail replaced w/ chest tube  SUBJECTIVE:  Hemodynamically stable. CXR showed worsening pneumothorax and worsening SQ emphysema.   VITAL SIGNS: Temp:  [97.7 F (36.5 C)-99.1 F (37.3 C)] 98 F (36.7 C) (07/06 1136) Pulse Rate:  [98-110] 103 (07/06 1121) Resp:  [28-37] 35 (07/06 1121) BP: (114-164)/(67-78) 164/75 mmHg (07/06 1121) SpO2:  [86 %-98 %] 96 % (07/06 1121) Arterial Line BP: (135-248)/(62-225) 159/71 mmHg (07/06 1100) FiO2 (%):  [70 %-80 %] 70 % (07/06 1121) Weight:  [252 lb 3.3 oz (114.4 kg)] 252 lb 3.3 oz (114.4 kg) (07/06 0500) HEMODYNAMICS: CVP:  [3 mmHg-8 mmHg] 3 mmHg VENTILATOR SETTINGS: Vent Mode:  [-] PRVC FiO2 (%):  [70 %-80 %] 70 % Set Rate:  [35 bmp] 35 bmp Vt Set:  [480 mL] 480 mL PEEP:  [10 cmH20-14 cmH20] 10 cmH20 Plateau Pressure:  [24 cmH20-33 cmH20] 26 cmH20 INTAKE / OUTPUT: Intake/Output      07/05 0701 - 07/06 0700 07/06 0701 - 07/07 0700    I.V. (mL/kg) 2591.9 (22.7) 413.1 (3.6)   NG/GT 3140.3 440   IV Piggyback 450    Total Intake(mL/kg) 6182.2 (54) 853.1 (7.5)   Urine (mL/kg/hr) 2390 (0.9) 800 (1.4)   Stool 80 (0)    Chest Tube 36 (0)    Total Output 2506 800   Net +3676.2 +53.1          PHYSICAL EXAMINATION: General: WDWN male, ill appearance; sedated HEENT: [place]/AT. PERRL. Extensive SQ air throughout thoracic cavity. Cardiovascular: Tachy, regular, no M/R/G.  Lungs: Respirations even and unlabored on vent. Diminished throughout. Bilat chest tubes in place.  Abdomen: BS present, soft, NT/ND.  Musculoskeletal: No gross deformities Skin: Intact, warm. Significant nonpitting edema in bilateral UE Neuro: sedated, paralyzed on vent  LABS:  CBC  Recent Labs Lab 02/24/15 0400 02/25/15 0320 02/26/15 0415  WBC 14.5* 17.7* 18.8*  HGB 8.2* 8.3* 8.5*  HCT 27.0* 27.2* 28.8*  PLT 198 210 240   Coag's No results for input(s): APTT, INR in the last 168 hours. BMET  Recent Labs Lab 02/24/15 1546 02/25/15 0320 02/26/15 0415  NA 154* 155* 152*  K 5.0 5.0 6.0*  CL 119* 119* 119*  CO2 BUN 88* 86* 78*  CREATININE 1.97* 1.80* 1.61*  GLUCOSE 241* 164* 177*   Electrolytes  Recent Labs Lab 02/21/15 1104 02/21/15 2234 02/22/15 0628  02/24/15 1546 02/25/15 0320 02/26/15 0415  CALCIUM  --   --  8.2*  < > 7.7* 7.8* 7.9*  MG 2.8* 2.9* 3.0*  --   --   --   --   PHOS 4.0 4.6 6.1*  --   --   --   --   < > = values in this interval not displayed. Sepsis Markers  Recent Labs Lab 02/23/15 0150  LATICACIDVEN 1.5   ABG  Recent Labs Lab 02/24/15 2334 02/25/15 0410 02/26/15 0438  PHART 7.279* 7.305* 7.284*  PCO2ART 60.1* 56.7* 56.6*  PO2ART 87.0 67.0* 67.0*   Liver Enzymes  Recent Labs Lab 02/23/15 0445  AST 14*  ALT 20  ALKPHOS 104  BILITOT 0.5  ALBUMIN 1.6*   Cardiac Enzymes No results for input(s): TROPONINI, PROBNP in the last 168 hours. Glucose  Recent Labs Lab  02/25/15 2141 02/25/15 2252 02/25/15 2348 02/26/15 0053 02/26/15 0157 02/26/15 0347  GLUCAP 157* 145* 147* 143* 154* 150*    Imaging Dg Chest Port 1 View  02/26/2015   CLINICAL DATA:  Chest tubes, history hypertension, diabetes mellitus  EXAM: PORTABLE CHEST - 1 VIEW  COMPARISON:  Portable exam 1038 hours compared to 0455 hours  FINDINGS: Tip of endotracheal tube projects 5.4 cm above carina.  Nasogastric tube extends into stomach, suspect extending into second portion of duodenum.  Pigtail RIGHT thoracostomy tube inferior RIGHT lung base stable.  Interval removal of LEFT basilar pigtail thoracostomy tube with placement of a chest tube.  Proximal side-port of the LEFT chest tube is external to the costal margin.  RIGHT jugular central venous catheter with tip projecting over cavoatrial junction.  Extensive subcutaneous edema of the chest wall bilaterally.  Enlargement of cardiac silhouette with normal mediastinal contours.  Minimal LEFT basilar atelectasis.  Probable tiny LEFT apex pneumothorax.  IMPRESSION: Extensive chest wall emphysema.  New LEFT thoracostomy tube though the proximal side-port is external to the costal margin.  Probable tiny LEFT apex pneumothorax.  Critical Value/emergent results were called by telephone at the time of interpretation on 02/26/2015 at 1048 hr to [facility] RN in ICU, who verbally acknowledged these results.   Electronically Signed   By: Ulyses SouthwardMark  Boles M.D.   On: 02/26/2015 10:50   Dg Chest Port 1 View  02/26/2015   CLINICAL DATA:  Respiratory failure  EXAM: PORTABLE CHEST - 1 VIEW  COMPARISON:  02/24/2015  FINDINGS: The endotracheal tube tip remains just below the clavicular heads. Right IJ central line, tip still at the upper cavoatrial junction. The orogastric tube enters the stomach at least.  Increased pneumomediastinum with more discrete outlining of the trachea and ascending aorta. Left pneumothorax remains small volume (less than 5%). There is interval  retraction of the left pleural catheter, with the majority of the sideholes external to the bony thorax. There may be increased soft tissue gas on the left, certainty limited by exclusion of this area on priors. Right-sided pleural drain remains in similar position.  Mildly improved lung aeration, although there is still extensive retrocardiac airspace disease. Normal heart size.  These results were called by telephone at the time of interpretation on 02/26/2015 at 6:35 am to RN Huntley DecSara, who verbally acknowledged these results.  IMPRESSION: 1. Increased pneumomediastinum. 2. Near complete retraction of the left-sided chest tube from the pleural space. Left pneumothorax is stable and small volume. 3. Other tubes and line are in stable position.   Electronically Signed   By: Marnee SpringJonathon  Watts M.D.   On: 02/26/2015 06:39    ASSESSMENT / PLAN:  PULMONARY OETT 6/28 >>>  A: Acute hypoxic respiratory failure in setting worsening bilateral airspace disease. No improvement with abx ARDS Baseline dermatomyositis - s/p 2 cycles methotrexate and on daily prednisone.  Pneumomediastinum, B apical PTX's - extensive subq emphysema  P:   ARDS protocol PEEP 12; FiO2 70% Bilat chest tubes in place; replaced Lt pigtail w/ chest tube; may require replacement of Rt pigtail in future VAP bundle. DC abx per ID Solumedrol 80mg  TID -- (started 7/2.) F/u Daily ABG  Permissive hypercapnia to some degree   Repeat BAL culture and PCP smear - PENDING Chest CT:  ordered  CARDIOVASCULAR A:  Sinus tach - improved.  Hx HTN  P:  Lopressor PRN. Hold outpatient olmesartan. Monitor CVP  F/u electrolytes   RENAL A:   AKI  ? Hypocalcemia - (7.9 not corrected) Hyperkalemia - 6.0 (7/6) Mixed acidosis - lactate normal  Hypernatremia  P:   Kayexalate 15mg  x 1 Dc HCO3 gtt  Continue electrolyte correction w/ free water BMP in AM  GASTROINTESTINAL A:   GERD. Nutrition. P:   SUP: Famotidine. TF  HEMATOLOGIC A:    Chronic anemia - no s/s bleeding.  Hgb stable @8 .5 7/6.  NO obvious s/s bleeding.   VTE Prophylaxis. P:  SCD's Hold heparin for now - resume when no further suspicion bleeding  Hgb appears stable at this time. Transfuse for hgb <7  INFECTIOUS A:   No response to abx makes HCAP unlikely  Wbc trending up, 18.8 (7/6) P:   BCx2 6/28 > neg to date BAL 6/28 > normal flora BAL 7/5 > PENDING RVP 6/29 >> negative U. Strep 6/28 > negative U. Legionella 6/28 > negative PCP smear 6/28 > negative Abx: Vanc, start date 6/28 >> DC 7/4 Abx: Ceftaz, start date 6/28 >> DC 7/5 myco 7/2>>> DC 7/5  Infectious Disease consulted: appreciate input. Feels infectious process is unlikely Wbc trending up but remains on steroids   ENDOCRINE A:   DM.  Significant hyperglycemia with addition high dose steroids (improving w/ Lantus) P:   SSI, TF coverage  Lantus 30u BID Insulin gtt   NEUROLOGIC A:   Acute encephalopathy due to sedation. Paralyzed  P:   Sedation: Fentanyl/versed  RASS goal -2 Hold outpatient gabapentin, norco.  RHEUMATOLOGIC A: Baseline dermatomyositis - s/p 2 cycles methotrexate and on daily prednisone. P: Continue Solumedrol. Consider cyclophosphamide vs rituximab  - pending CT results Will follow up with [name] at outpatient, will likely require immunotherapy (cellcept, etc)  Family updated:  Family updated daily at bedside.   Interdisciplinary Family Meeting v Palliative Care Meeting:  Due by: 02/24/15.   Kathee Delton, MD,MS,  PGY2 02/26/2015 11:53 AM   PCCM ATTENDING: I have reviewed pt's initial presentation, consultants notes and hospital database in detail.  The above assessment and plan was formulated under my direction.  In summary: Severe ARDS pattern persists L pleural catheter has migrated out of chest and there is increased SQ emphysema and a small L PTX A larger L chest tueb has now been placed and CT chest has been performed He continues to have  extensive pneumomediastinum and sq gas.   The exact site of air leak is not clear Prognosis is increasingly poor Wife updated at bedside   60 minutes of independent CCM time was provided by me   Billy Fischer, MD;  PCCM service; Mobile [phone]

## 2015-02-26 NOTE — Progress Notes (Signed)
eLink Physician-Brief Progress Note Patient Name: Derek Anthony DOB: January 31, 1956 MRN: [id number]   Date of Service  02/26/2015  HPI/Events of Note  Persistent high K and glucose  eICU Interventions  Restart insulin gtt.      Intervention Category Major Interventions: Other:  Iva Montelongo 02/26/2015, 10:52 PM

## 2015-02-26 NOTE — Progress Notes (Signed)
Critical PCO2 57.6 given to Deere & CompanyMeyers RN. No change from previous ABG.

## 2015-02-26 NOTE — Procedures (Signed)
CHEST TUBE PLACEMENT  INDICATION: malposition of L pleural catheter. Increasing SubQ emphysema and L PTX  PROCEDURE: Consent obtained from wife Pt sedated on vent Area prepped and draped in sterile manner Local anesthesia achieved with 1% lidocaine 1 cm incision made in mid axillary line approx 4 cm bellow level of nipple Blunt dissection to the rib cage Pleural space entered with curved hemostat Pleural space palpated digitally - there were extensive adhesions broken up A 28 FR chest tube was advanced through the formed tract until resistance was met Tube connected to suction with + bubbling CXR ordered and reviewed  Merton Border, MD ; [facility] service Mobile [phone].  After 5:30 PM or weekends, call [phone]

## 2015-02-26 NOTE — Progress Notes (Addendum)
CRITICAL VALUE ALERT  Critical value received:  K+ 6.0   Date of notification:  Notified DR before lab called   Time of notification:  5:00  Critical value read back:No.  Nurse who received alert:  Wende NeighborsSara Koren Plyler, RN  MD notified (1st page):  MD I. McKeag  Time of first page:  5:00  MD notified (2nd page):  Time of second page:  Responding MD:  MD I. McKeag  Time MD responded:  5:02

## 2015-02-26 NOTE — Progress Notes (Signed)
Patient ID: Derek Anthony, male   DOB: 1956-04-12, 59 y.o.   MRN: [id number]         [facility]    Date of Admission:  02/18/2015     Principal Problem:   Acute respiratory failure with hypoxia Active Problems:   Dermatomyositis   Pulmonary infiltrates   Healthcare-associated pneumonia   Pneumonitis   Arterial hypotension   Pneumothorax   Acute respiratory failure   Encounter for central line placement   Encounter for feeding tube placement   ARDS (adult respiratory distress syndrome)   . antiseptic oral rinse  7 mL Mouth Rinse QID  . artificial tears  1 application Both Eyes 3 times per day  . chlorhexidine  15 mL Mouth Rinse BID  . famotidine  20 mg Per Tube BID  . fentaNYL (SUBLIMAZE) injection  50 mcg Intravenous Once  . folic acid  1 mg Per Tube Daily  . free water  300 mL Per Tube Q4H  . heparin  5,000 Units Subcutaneous 3 times per day  . insulin glargine  30 Units Subcutaneous BID  . methylPREDNISolone (SOLU-MEDROL) injection  80 mg Intravenous 3 times per day  . midazolam  2 mg Intravenous Once    Past Medical History  Diagnosis Date  . Diabetes mellitus without complication   . Hypertension   . GERD (gastroesophageal reflux disease)   . Anemia   . Sickle cell trait   . Gout   . Wears glasses   . Muscle pain   . Dermatomyositis     History  Substance Use Topics  . Smoking status: Former Smoker -- 1.00 packs/day for 20 years    Types: Cigarettes    Quit date: 10/29/1992  . Smokeless tobacco: Not on file  . Alcohol Use: No    Family History  Problem Relation Age of Onset  . Hypertension Mother   . Diabetes Mother   . Hypertension Father    Allergies  Allergen Reactions  . Sulfa Antibiotics Itching and Rash    Burning also  . Penicillins Itching and Rash    OBJECTIVE: Blood pressure 141/69, pulse 108, temperature 98 F (36.7 C), temperature source Oral, resp. rate 27, height 6\' 1"  (1.854 m), weight 252 lb 3.3  oz (114.4 kg), SpO2 87 %. General: He remains sedated and on the ventilator Chest: Extensive subcutaneous air with some crepitus over anterior chest Lungs: Clear anteriorly Cor: Regular S1 and S2 with no murmur heard  Lab Results Lab Results  Component Value Date   WBC 18.8* 02/26/2015   HGB 8.5* 02/26/2015   HCT 28.8* 02/26/2015   MCV 88.1 02/26/2015   PLT 240 02/26/2015    Lab Results  Component Value Date   CREATININE 1.61* 02/26/2015   BUN 78* 02/26/2015   NA 152* 02/26/2015   K 6.0* 02/26/2015   CL 119* 02/26/2015   CO2 28 02/26/2015    Lab Results  Component Value Date   ALT 20 02/23/2015   AST 14* 02/23/2015   ALKPHOS 104 02/23/2015   BILITOT 0.5 02/23/2015     Microbiology: Recent Results (from the past 240 hour(s))  Blood culture (routine x 2)     Status: None   Collection Time: 02/18/15  5:00 PM  Result Value Ref Range Status   Specimen Description BLOOD RIGHT ARM  Final   Special Requests BOTTLES DRAWN AEROBIC AND ANAEROBIC 5CC  Final   Culture NO GROWTH 5 DAYS  Final   Report  Status 02/23/2015 FINAL  Final  Culture, blood (routine x 2)     Status: None   Collection Time: 02/18/15  5:39 PM  Result Value Ref Range Status   Specimen Description BLOOD RIGHT HAND  Final   Special Requests BOTTLES DRAWN AEROBIC ONLY 4CC  Final   Culture NO GROWTH 5 DAYS  Final   Report Status 02/24/2015 FINAL  Final  MRSA PCR Screening     Status: Abnormal   Collection Time: 02/18/15  8:45 PM  Result Value Ref Range Status   MRSA by PCR POSITIVE (A) NEGATIVE Final    Comment:        The GeneXpert MRSA Assay (FDA approved for NASAL specimens only), is one component of a comprehensive MRSA colonization surveillance program. It is not intended to diagnose MRSA infection nor to guide or monitor treatment for MRSA infections. RESULT CALLED TO, READ BACK BY AND VERIFIED WITH: SMITH,A RN [phone] 02/19/15 MITCHELL,L   Pneumocystis smear by DFA     Status: None   Collection  Time: 02/18/15  9:15 PM  Result Value Ref Range Status   Specimen Source-PJSRC BRONCHIAL ALVEOLAR LAVAGE  Final   Pneumocystis jiroveci Ag NEGATIVE  Final    Comment: Performed at [facility]  Culture, respiratory (NON-Expectorated)     Status: None   Collection Time: 02/18/15  9:15 PM  Result Value Ref Range Status   Specimen Description BRONCHIAL ALVEOLAR LAVAGE  Final   Special Requests NONE  Final   Gram Stain   Final    RARE WBC PRESENT, PREDOMINANTLY MONONUCLEAR RARE SQUAMOUS EPITHELIAL CELLS PRESENT NO ORGANISMS SEEN Performed at [facility]    Culture   Final    Non-Pathogenic Oropharyngeal-type Flora Isolated. Performed at [facility]    Report Status 02/21/2015 FINAL  Final  AFB culture with smear     Status: None (Preliminary result)   Collection Time: 02/18/15  9:15 PM  Result Value Ref Range Status   Specimen Description BRONCHIAL ALVEOLAR LAVAGE  Final   Special Requests NONE  Final   Acid Fast Smear   Final    NO ACID FAST BACILLI SEEN Performed at [facility]    Culture   Final    CULTURE WILL BE EXAMINED FOR 6 WEEKS BEFORE ISSUING A FINAL REPORT Performed at [facility]    Report Status PENDING  Incomplete  Respiratory virus panel     Status: None   Collection Time: 02/18/15  9:15 PM  Result Value Ref Range Status   Source - RVPAN BRONCHIAL ALVEOLAR LAVAGE  Corrected   Respiratory Syncytial Virus A Negative Negative Final   Respiratory Syncytial Virus B Negative Negative Final   Influenza A Negative Negative Final   Influenza B Negative Negative Final   Parainfluenza 1 Negative Negative Final   Parainfluenza 2 Negative Negative Final   Parainfluenza 3 Negative Negative Final   Metapneumovirus Negative Negative Final   Rhinovirus Negative Negative Final   Adenovirus Negative Negative Final    Comment: (NOTE) Performed At: [facility] [address] [place], [place] [id number] Mila Homer MD JX:[id number]   Fungus Culture with Smear     Status: None (Preliminary result)   Collection Time: 02/18/15  9:15 PM  Result Value Ref Range Status   Specimen Description BRONCHIAL ALVEOLAR LAVAGE  Final   Special Requests NONE  Final   Fungal Smear   Final    NO YEAST OR FUNGAL ELEMENTS  SEEN Performed at [facility]   Final    CULTURE IN PROGRESS FOR FOUR WEEKS Performed at [facility]    Report Status PENDING  Incomplete  Blood culture (routine x 2)     Status: None   Collection Time: 02/18/15  9:22 PM  Result Value Ref Range Status   Specimen Description BLOOD LEFT HAND  Final   Special Requests BOTTLES DRAWN AEROBIC ONLY 7CC  Final   Culture NO GROWTH 5 DAYS  Final   Report Status 02/23/2015 FINAL  Final  Culture, respiratory (NON-Expectorated)     Status: None (Preliminary result)   Collection Time: 02/25/15 12:21 PM  Result Value Ref Range Status   Specimen Description BRONCHIAL ALVEOLAR LAVAGE  Final   Special Requests NONE  Final   Gram Stain   Final    RARE WBC PRESENT,BOTH PMN AND MONONUCLEAR NO SQUAMOUS EPITHELIAL CELLS SEEN NO ORGANISMS SEEN Performed at [facility]    Culture PENDING  Incomplete   Report Status PENDING  Incomplete    Assessment: He just completed another round of empiric antibiotics for possible HCAP. He remains afebrile. I see no indications for further antibiotics therapy at this time.  Plan: 1. Observe off of antibiotics 2. I discussed the situation and my recommendations with his wife today  Cliffton Asters, MD [facility] [phone] pager   [phone] cell 02/26/2015, 1:24 PM

## 2015-02-26 NOTE — Progress Notes (Signed)
eLink Physician-Brief Progress Note Patient Name: Derek Anthony DOB: 06-Oct-1955 MRN: [id number]   Date of Service  02/26/2015  HPI/Events of Note  K=6.3, CBG=300  eICU Interventions  Total of 10u of Novolog, recheck K     Intervention Category Intermediate Interventions: Electrolyte abnormality - evaluation and management  Alya Smaltz 02/26/2015, 8:42 PM

## 2015-02-27 ENCOUNTER — Inpatient Hospital Stay (HOSPITAL_COMMUNITY): Payer: 59

## 2015-02-27 DIAGNOSIS — R739 Hyperglycemia, unspecified: Secondary | ICD-10-CM

## 2015-02-27 DIAGNOSIS — J939 Pneumothorax, unspecified: Secondary | ICD-10-CM

## 2015-02-27 LAB — COMPREHENSIVE METABOLIC PANEL
ALT: 24 U/L (ref 17–63)
ANION GAP: 6 (ref 5–15)
AST: 26 U/L (ref 15–41)
Albumin: 1.9 g/dL — ABNORMAL LOW (ref 3.5–5.0)
Alkaline Phosphatase: 114 U/L (ref 38–126)
BUN: 83 mg/dL — ABNORMAL HIGH (ref 6–20)
CO2: 28 mmol/L (ref 22–32)
CREATININE: 1.49 mg/dL — AB (ref 0.61–1.24)
Calcium: 8.1 mg/dL — ABNORMAL LOW (ref 8.9–10.3)
Chloride: 116 mmol/L — ABNORMAL HIGH (ref 101–111)
GFR calc Af Amer: 57 mL/min — ABNORMAL LOW (ref >60)
GFR, EST NON AFRICAN AMERICAN: 49 mL/min — AB (ref >60)
Glucose, Bld: 194 mg/dL — ABNORMAL HIGH (ref 65–99)
Potassium: 6 mmol/L — ABNORMAL HIGH (ref 3.5–5.1)
Sodium: 150 mmol/L — ABNORMAL HIGH (ref 135–145)
Total Bilirubin: 0.4 mg/dL (ref 0.3–1.2)
Total Protein: 5.2 g/dL — ABNORMAL LOW (ref 6.5–8.1)

## 2015-02-27 LAB — URINALYSIS, ROUTINE W REFLEX MICROSCOPIC
Bilirubin Urine: NEGATIVE
GLUCOSE, UA: NEGATIVE mg/dL
Ketones, ur: NEGATIVE mg/dL
LEUKOCYTES UA: NEGATIVE
NITRITE: NEGATIVE
Protein, ur: NEGATIVE mg/dL
SPECIFIC GRAVITY, URINE: 1.007 (ref 1.005–1.030)
Urobilinogen, UA: 0.2 mg/dL (ref 0.0–1.0)
pH: 5.5 (ref 5.0–8.0)

## 2015-02-27 LAB — GLUCOSE, CAPILLARY
GLUCOSE-CAPILLARY: 162 mg/dL — AB (ref 65–99)
GLUCOSE-CAPILLARY: 301 mg/dL — AB (ref 65–99)
GLUCOSE-CAPILLARY: 284 mg/dL — AB (ref 65–99)
GLUCOSE-CAPILLARY: 225 mg/dL — AB (ref 65–99)
GLUCOSE-CAPILLARY: 167 mg/dL — AB (ref 65–99)
GLUCOSE-CAPILLARY: 142 mg/dL — AB (ref 65–99)
GLUCOSE-CAPILLARY: 147 mg/dL — AB (ref 65–99)
GLUCOSE-CAPILLARY: 176 mg/dL — AB (ref 65–99)
GLUCOSE-CAPILLARY: 185 mg/dL — AB (ref 65–99)
Glucose-Capillary: 136 mg/dL — ABNORMAL HIGH (ref 65–99)
Glucose-Capillary: 141 mg/dL — ABNORMAL HIGH (ref 65–99)
Glucose-Capillary: 240 mg/dL — ABNORMAL HIGH (ref 65–99)
Glucose-Capillary: 256 mg/dL — ABNORMAL HIGH (ref 65–99)
Glucose-Capillary: 289 mg/dL — ABNORMAL HIGH (ref 65–99)
Glucose-Capillary: 130 mg/dL — ABNORMAL HIGH (ref 65–99)
Glucose-Capillary: 142 mg/dL — ABNORMAL HIGH (ref 65–99)
Glucose-Capillary: 148 mg/dL — ABNORMAL HIGH (ref 65–99)
Glucose-Capillary: 162 mg/dL — ABNORMAL HIGH (ref 65–99)
Glucose-Capillary: 146 mg/dL — ABNORMAL HIGH (ref 65–99)
Glucose-Capillary: 159 mg/dL — ABNORMAL HIGH (ref 65–99)
Glucose-Capillary: 160 mg/dL — ABNORMAL HIGH (ref 65–99)
Glucose-Capillary: 163 mg/dL — ABNORMAL HIGH (ref 65–99)

## 2015-02-27 LAB — CBC
HCT: 29.8 % — ABNORMAL LOW (ref 39.0–52.0)
Hemoglobin: 8.7 g/dL — ABNORMAL LOW (ref 13.0–17.0)
MCH: 25.8 pg — AB (ref 26.0–34.0)
MCHC: 29.2 g/dL — ABNORMAL LOW (ref 30.0–36.0)
MCV: 88.4 fL (ref 78.0–100.0)
PLATELETS: 324 10*3/uL (ref 150–400)
RBC: 3.37 MIL/uL — ABNORMAL LOW (ref 4.22–5.81)
RDW: 21.4 % — ABNORMAL HIGH (ref 11.5–15.5)
WBC: 19.4 10*3/uL — ABNORMAL HIGH (ref 4.0–10.5)

## 2015-02-27 LAB — BASIC METABOLIC PANEL
ANION GAP: 5 (ref 5–15)
BUN: 85 mg/dL — AB (ref 6–20)
CO2: 28 mmol/L (ref 22–32)
Calcium: 8 mg/dL — ABNORMAL LOW (ref 8.9–10.3)
Chloride: 115 mmol/L — ABNORMAL HIGH (ref 101–111)
Creatinine, Ser: 1.52 mg/dL — ABNORMAL HIGH (ref 0.61–1.24)
GFR, EST AFRICAN AMERICAN: 55 mL/min — AB (ref >60)
GFR, EST NON AFRICAN AMERICAN: 48 mL/min — AB (ref >60)
Glucose, Bld: 192 mg/dL — ABNORMAL HIGH (ref 65–99)
Potassium: 6 mmol/L — ABNORMAL HIGH (ref 3.5–5.1)
SODIUM: 148 mmol/L — AB (ref 135–145)

## 2015-02-27 LAB — URINE MICROSCOPIC-ADD ON

## 2015-02-27 LAB — POTASSIUM: POTASSIUM: 6.1 mmol/L — AB (ref 3.5–5.1)

## 2015-02-27 MED ORDER — LIDOCAINE HCL (PF) 1 % IJ SOLN
INTRAMUSCULAR | Status: AC
  Filled 2015-02-27: qty 10

## 2015-02-27 MED ORDER — SODIUM CHLORIDE 0.9 % IV SOLN
1000.0000 mg | INTRAVENOUS | Status: AC
  Administered 2015-02-28: 1000 mg via INTRAVENOUS
  Filled 2015-02-27 (×2): qty 100

## 2015-02-27 MED ORDER — INSULIN GLARGINE 100 UNIT/ML ~~LOC~~ SOLN
40.0000 [IU] | Freq: Two times a day (BID) | SUBCUTANEOUS | Status: DC
  Administered 2015-02-27 – 2015-03-01 (×4): 40 [IU] via SUBCUTANEOUS
  Filled 2015-02-27 (×5): qty 0.4

## 2015-02-27 MED ORDER — HYDRALAZINE HCL 20 MG/ML IJ SOLN
10.0000 mg | INTRAMUSCULAR | Status: DC | PRN
  Administered 2015-03-01 (×2): 20 mg via INTRAVENOUS
  Filled 2015-02-27 (×3): qty 1

## 2015-02-27 MED ORDER — LIDOCAINE HCL (PF) 1 % IJ SOLN
INTRAMUSCULAR | Status: AC
  Filled 2015-02-27: qty 5

## 2015-02-27 MED ORDER — FUROSEMIDE 10 MG/ML IJ SOLN
60.0000 mg | Freq: Four times a day (QID) | INTRAMUSCULAR | Status: AC
  Administered 2015-02-27 (×2): 60 mg via INTRAVENOUS
  Filled 2015-02-27 (×2): qty 6

## 2015-02-27 MED ORDER — SODIUM CHLORIDE 0.9 % IV SOLN: 1000.0000 mg | INTRAVENOUS | Status: DC

## 2015-02-27 NOTE — Progress Notes (Signed)
Notified E-link of the increasing subcutaneous air, most noticeably in the right supraclavicular area near the IJ insertion site.  Asked physician to camera in: Joneen RoachPaul Hoffman at bedside assessing patient.  Patient's vitals are stable: BP 147/65 (arterial line), O2 96 (FIO2= 60, PEEP=10, RR= 35), HR= 100.  Will continue to monitor and assess.

## 2015-02-27 NOTE — Progress Notes (Signed)
Patient ID: Derek Anthony, male   DOB: Apr 06, 1956, 59 y.o.   MRN: [id number]         [facility]    Date of Admission:  02/18/2015     Principal Problem:   Acute respiratory failure with hypoxia Active Problems:   Dermatomyositis   Pulmonary infiltrates   Healthcare-associated pneumonia   Pneumonitis   Arterial hypotension   Pneumothorax   Acute respiratory failure   Encounter for central line placement   Encounter for feeding tube placement   ARDS (adult respiratory distress syndrome)   Pneumomediastinum   Pneumothorax on right   . antiseptic oral rinse  7 mL Mouth Rinse QID  . artificial tears  1 application Both Eyes 3 times per day  . chlorhexidine  15 mL Mouth Rinse BID  . famotidine  20 mg Per Tube BID  . fentaNYL (SUBLIMAZE) injection  50 mcg Intravenous Once  . folic acid  1 mg Per Tube Daily  . free water  300 mL Per Tube Q4H  . furosemide  60 mg Intravenous Q6H  . heparin  5,000 Units Subcutaneous 3 times per day  . insulin aspart  2-6 Units Subcutaneous 6 times per day  . insulin glargine  30 Units Subcutaneous BID  . lidocaine (PF)      . lidocaine (PF)      . methylPREDNISolone (SOLU-MEDROL) injection  80 mg Intravenous 3 times per day  . midazolam  2 mg Intravenous Once    Past Medical History  Diagnosis Date  . Diabetes mellitus without complication   . Hypertension   . GERD (gastroesophageal reflux disease)   . Anemia   . Sickle cell trait   . Gout   . Wears glasses   . Muscle Anthony   . Dermatomyositis     History  Substance Use Topics  . Smoking status: Former Smoker -- 1.00 packs/day for 20 years    Types: Cigarettes    Quit date: 10/29/1992  . Smokeless tobacco: Not on file  . Alcohol Use: No    Family History  Problem Relation Age of Onset  . Hypertension Mother   . Diabetes Mother   . Hypertension Father    Allergies  Allergen Reactions  . Sulfa Antibiotics Itching and Rash    Burning also  .  Penicillins Itching and Rash    OBJECTIVE: Blood pressure 132/73, pulse 97, temperature 98.5 F (36.9 C), temperature source Oral, resp. rate 29, height 6\' 1"  (1.854 m), weight 258 lb 2.5 oz (117.1 kg), SpO2 90 %. General: He remains sedated and unresponsive on the ventilator Lungs: Diffuse rhonchi  Lab Results Lab Results  Component Value Date   WBC 19.4* 02/27/2015   HGB 8.7* 02/27/2015   HCT 29.8* 02/27/2015   MCV 88.4 02/27/2015   PLT 324 02/27/2015    Lab Results  Component Value Date   CREATININE 1.49* 02/27/2015   BUN 83* 02/27/2015   NA 150* 02/27/2015   K 6.0* 02/27/2015   CL 116* 02/27/2015   CO2 28 02/27/2015    Lab Results  Component Value Date   ALT 24 02/27/2015   AST 26 02/27/2015   ALKPHOS 114 02/27/2015   BILITOT 0.4 02/27/2015     Microbiology: Recent Results (from the past 240 hour(s))  Blood culture (routine x 2)     Status: None   Collection Time: 02/18/15  5:00 PM  Result Value Ref Range Status   Specimen Description BLOOD RIGHT ARM  Final   Special Requests BOTTLES DRAWN AEROBIC AND ANAEROBIC 5CC  Final   Culture NO GROWTH 5 DAYS  Final   Report Status 02/23/2015 FINAL  Final  Culture, blood (routine x 2)     Status: None   Collection Time: 02/18/15  5:39 PM  Result Value Ref Range Status   Specimen Description BLOOD RIGHT HAND  Final   Special Requests BOTTLES DRAWN AEROBIC ONLY 4CC  Final   Culture NO GROWTH 5 DAYS  Final   Report Status 02/24/2015 FINAL  Final  MRSA PCR Screening     Status: Abnormal   Collection Time: 02/18/15  8:45 PM  Result Value Ref Range Status   MRSA by PCR POSITIVE (A) NEGATIVE Final    Comment:        The GeneXpert MRSA Assay (FDA approved for NASAL specimens only), is one component of a comprehensive MRSA colonization surveillance program. It is not intended to diagnose MRSA infection nor to guide or monitor treatment for MRSA infections. RESULT CALLED TO, READ BACK BY AND VERIFIED WITH: SMITH,A RN  [phone] 02/19/15 MITCHELL,L   Pneumocystis smear by DFA     Status: None   Collection Time: 02/18/15  9:15 PM  Result Value Ref Range Status   Specimen Source-PJSRC BRONCHIAL ALVEOLAR LAVAGE  Final   Pneumocystis jiroveci Ag NEGATIVE  Final    Comment: Performed at [facility]  Culture, respiratory (NON-Expectorated)     Status: None   Collection Time: 02/18/15  9:15 PM  Result Value Ref Range Status   Specimen Description BRONCHIAL ALVEOLAR LAVAGE  Final   Special Requests NONE  Final   Gram Stain   Final    RARE WBC PRESENT, PREDOMINANTLY MONONUCLEAR RARE SQUAMOUS EPITHELIAL CELLS PRESENT NO ORGANISMS SEEN Performed at [facility]    Culture   Final    Non-Pathogenic Oropharyngeal-type Flora Isolated. Performed at [facility]    Report Status 02/21/2015 FINAL  Final  AFB culture with smear     Status: None (Preliminary result)   Collection Time: 02/18/15  9:15 PM  Result Value Ref Range Status   Specimen Description BRONCHIAL ALVEOLAR LAVAGE  Final   Special Requests NONE  Final   Acid Fast Smear   Final    NO ACID FAST BACILLI SEEN Performed at [facility]    Culture   Final    CULTURE WILL BE EXAMINED FOR 6 WEEKS BEFORE ISSUING A FINAL REPORT Performed at [facility]    Report Status PENDING  Incomplete  Respiratory virus panel     Status: None   Collection Time: 02/18/15  9:15 PM  Result Value Ref Range Status   Source - RVPAN BRONCHIAL ALVEOLAR LAVAGE  Corrected   Respiratory Syncytial Virus A Negative Negative Final   Respiratory Syncytial Virus B Negative Negative Final   Influenza A Negative Negative Final   Influenza B Negative Negative Final   Parainfluenza 1 Negative Negative Final   Parainfluenza 2 Negative Negative Final   Parainfluenza 3 Negative Negative Final   Metapneumovirus Negative Negative Final   Rhinovirus Negative Negative Final   Adenovirus Negative Negative Final    Comment:  (NOTE) Performed At: [facility] [address] [place], [place] [id number] Mila Homer MD [id number]   Fungus Culture with Smear     Status: None (Preliminary result)   Collection Time: 02/18/15  9:15 PM  Result Value Ref Range Status   Specimen Description BRONCHIAL ALVEOLAR  LAVAGE  Final   Special Requests NONE  Final   Fungal Smear   Final    NO YEAST OR FUNGAL ELEMENTS SEEN [facility]    Culture   Final    CULTURE IN PROGRESS FOR FOUR WEEKS [facility]    Report Status PENDING  Incomplete  Blood culture (routine x 2)     Status: None   Collection Time: 02/18/15  9:22 PM  Result Value Ref Range Status   Specimen Description BLOOD LEFT HAND  Final   Special Requests BOTTLES DRAWN AEROBIC ONLY 7CC  Final   Culture NO GROWTH 5 DAYS  Final   Report Status 02/23/2015 FINAL  Final  Culture, respiratory (NON-Expectorated)     Status: None (Preliminary result)   Collection Time: 02/25/15 12:21 PM  Result Value Ref Range Status   Specimen Description BRONCHIAL ALVEOLAR LAVAGE  Final   Special Requests NONE  Final   Gram Stain   Final    RARE WBC PRESENT,BOTH PMN AND MONONUCLEAR NO SQUAMOUS EPITHELIAL CELLS SEEN NO ORGANISMS SEEN [facility]    Culture   Final    NO GROWTH 1 DAY [facility]    Report Status PENDING  Incomplete   CT CHEST WITHOUT CONTRAST 02/26/2015  TECHNIQUE: Multidetector CT imaging of the chest was performed following the standard protocol without IV contrast.  IMPRESSION: 1. Extensive pneumomediastinum. 2. Diffuse chest wall subcutaneous emphysema. 3. Small left pneumothorax. The side port for the left chest tube may be within the chest wall and outside of the left pleural space. 4. Pneumoperitoneum. 5. Interstitial and airspace opacities compatible with ARDS.   Electronically Signed  By: Signa Kellaylor Stroud M.D.  On: 02/26/2015  15:45   Assessment: He continues to have bilateral infiltrates and acute respiratory failure of unknown cause. He remains afebrile off of antibiotics. Gram stain repeat BAL specimen yesterday is negative. Pneumocystis stain and cultures are pending.  Plan: 1. Continue observation off antibiotics 2. Await results pneumocystis stain and BAL cultures  Cliffton AstersJohn Ellissa Ayo, MD [facility] [phone] pager   [phone] cell 02/27/2015, 11:16 AM

## 2015-02-27 NOTE — Progress Notes (Signed)
PULMONARY / CRITICAL CARE MEDICINE   Name: Derek Anthony MRN: [id number] DOB: 1955/09/10    ADMISSION DATE:  02/18/2015 CONSULTATION DATE:  02/27/2015   REFERRING MD :  EDP  CHIEF COMPLAINT:  Dyspnea, respiratory distress  STUDIES:  FOB 6/28 >>> BAL culture - neg, PCP - neg, AFB - neg, Cell Ct - 463 WBC, 65% mono, 27% pmn, 8% lymph, 0% eos, Cytology - neg for CA , Flow Cytometry - non diagnostic, BAL Fungal Culture - neg CT Chest 7/6 >> pneumomediastinum; SQ emphysema; small Lt pneumothorax; pneumoperitoneum; ARDS  SIGNIFICANT EVENTS: 7/6: Lt pig tail replaced w/ chest tube; 7/7: Rt pigtail replaced w/ chest tube. Patient's Rheumatologist contacted ([name]):   - States dermatomyositis dx is in question.   - Does not believe further immunosuppression is the ideal route at this time.  - Rituximab would be her choice if we did progress  - consideration to transfer to tertiary center for further dx w/u and treatment. Initiating Rituximab therapy 7/7  SUBJECTIVE:  Hemodynamically stable. Replacement chest tubes in place. No additional acute events.   VITAL SIGNS: Temp:  [97.9 F (36.6 C)-98.5 F (36.9 C)] 98.2 F (36.8 C) (07/07 1130) Pulse Rate:  [84-110] 101 (07/07 1300) Resp:  [21-38] 32 (07/07 1300) BP: (116-149)/(60-81) 116/60 mmHg (07/07 1200) SpO2:  [82 %-100 %] 90 % (07/07 1300) Arterial Line BP: (134-189)/(59-104) 134/59 mmHg (07/07 1300) FiO2 (%):  [50 %-70 %] 50 % (07/07 1143) Weight:  [258 lb 2.5 oz (117.1 kg)] 258 lb 2.5 oz (117.1 kg) (07/07 0500) HEMODYNAMICS: CVP:  [2 mmHg-5 mmHg] 2 mmHg VENTILATOR SETTINGS: Vent Mode:  [-] PRVC FiO2 (%):  [50 %-70 %] 50 % Set Rate:  [35 bmp] 35 bmp Vt Set:  [480 mL] 480 mL PEEP:  [8 cmH20-10 cmH20] 8 cmH20 Plateau Pressure:  [28 cmH20-33 cmH20] 28 cmH20 INTAKE / OUTPUT: Intake/Output      07/06 0701 - 07/07 0700 07/07 0701 - 07/08 0700   I.V. (mL/kg) 2384 (20.4) 598.2 (5.1)   Other 30    NG/GT 2065 510   IV  Piggyback     Total Intake(mL/kg) 4479 (38.2) 1108.2 (9.5)   Urine (mL/kg/hr) 3645 (1.3) 1450 (1.8)   Stool 100 (0)    Chest Tube 98 (0)    Total Output 3843 1450   Net +636 -341.8          PHYSICAL EXAMINATION: General: WDWN male, ill appearance; sedated/intubated HEENT: PERRL. Extensive SQ air throughout thorax and neck. Cardiovascular: Tachy, regular, no M/R/G.  Lungs: Respirations even and unlabored on vent. Diminished throughout. Bilat chest tubes in place.  Abdomen: BS present, soft, some slight distention present.  Musculoskeletal: No gross deformities Skin: Intact, warm. Significant nonpitting edema in bilateral UE Neuro: sedated, paralyzed on vent  LABS:  CBC  Recent Labs Lab 02/25/15 0320 02/26/15 0415 02/27/15 0550  WBC 17.7* 18.8* 19.4*  HGB 8.3* 8.5* 8.7*  HCT 27.2* 28.8* 29.8*  PLT 210 240 324   Coag's No results for input(s): APTT, INR in the last 168 hours. BMET  Recent Labs Lab 02/26/15 0415 02/26/15 1929 02/26/15 2155 02/27/15 0005 02/27/15 0550  NA 152* 152*  --   --  150*  K 6.0* 6.3* 6.3* 6.1* 6.0*  CL 119* 118*  --   --  116*  CO2 28 28  --   --  28  BUN 78* 77*  --   --  83*  CREATININE 1.61* 1.53*  --   --  1.49*  GLUCOSE 177* 305*  --   --  194*   Electrolytes  Recent Labs Lab 02/21/15 1104 02/21/15 2234 02/22/15 0628  02/26/15 0415 02/26/15 1929 02/27/15 0550  CALCIUM  --   --  8.2*  < > 7.9* 7.9* 8.1*  MG 2.8* 2.9* 3.0*  --   --   --   --   PHOS 4.0 4.6 6.1*  --   --   --   --   < > = values in this interval not displayed. Sepsis Markers  Recent Labs Lab 02/23/15 0150  LATICACIDVEN 1.5   ABG  Recent Labs Lab 02/24/15 2334 02/25/15 0410 02/26/15 0438  PHART 7.279* 7.305* 7.284*  PCO2ART 60.1* 56.7* 56.6*  PO2ART 87.0 67.0* 67.0*   Liver Enzymes  Recent Labs Lab 02/23/15 0445 02/27/15 0550  AST 14* 26  ALT 20 24  ALKPHOS 104 114  BILITOT 0.5 0.4  ALBUMIN 1.6* 1.9*   Cardiac Enzymes No results  for input(s): TROPONINI, PROBNP in the last 168 hours. Glucose  Recent Labs Lab 02/26/15 1934 02/26/15 2248 02/27/15 0023 02/27/15 0128 02/27/15 0233 02/27/15 0338  GLUCAP 240* 301* 289* 284* 256* 225*    Imaging Ct Chest Wo Contrast  02/26/2015   CLINICAL DATA:  Worsening pneumothorax  EXAM: CT CHEST WITHOUT CONTRAST  TECHNIQUE: Multidetector CT imaging of the chest was performed following the standard protocol without IV contrast.  COMPARISON:  CT chest 02/04/2015  FINDINGS: Mediastinum: There is an endotracheal tube with tip situated above the carina. There is an nasogastric tube with tip in the distal stomach. A left-sided chest tube is identified which extends over the lateral aspect of the left upper lobe. The side port for the left chest tube appears to be beyond the lateral margin of the left lung space. There is a small pigtail drainage catheter within the right lower pleural space. Extensive pneumomediastinum and pneumopericardium is identified. No evidence for pericardial effusion. No mediastinal hematoma.  Lungs/Pleura: There is a small left-sided pneumothorax. Diffuse interstitial opacities are identified bilaterally with lower lobe airspace consolidation consistent with ARDS. Scattered lung cysts are identified bilaterally.  Upper Abdomen: Extensive pneumoperitoneum is noted. No focal liver abnormality. The visualized portions of the spleen and gallbladder are unremarkable.  Musculoskeletal: Review of the visualized osseous structures is negative for aggressive lytic or sclerotic bone lesion.  IMPRESSION: 1. Extensive pneumomediastinum. 2. Diffuse chest wall subcutaneous emphysema. 3. Small left pneumothorax. The side port for the left chest tube may be within the chest wall and outside of the left pleural space. 4. Pneumoperitoneum. 5. Interstitial and airspace opacities compatible with ARDS.   Electronically Signed   By: Signa Kellaylor  Stroud M.D.   On: 02/26/2015 15:45   Dg Chest Port 1  View  02/27/2015   CLINICAL DATA:  Respiratory failure, shortness of breath, acute respiratory failure and hypoxia  EXAM: PORTABLE CHEST - 1 VIEW  COMPARISON:  Portable chest x-ray as well as chest CT scan of February 26, 2015  FINDINGS: There remains a small left-sided pneumothorax. The chest tubes are unchanged in position with the proximal port of the left chest tube lying outside the chest wall in the axillary soft tissues. There is extensive subcutaneous emphysema bilaterally. The cardiac silhouette is top-normal in size but stable. The pulmonary vascularity is not engorged. There are coarse interstitial markings in both lungs. There is stable increase retrocardiac density on the left with small air bronchogram.  The endotracheal tube tip lies 3 cm above the  carina. The esophagogastric tube tip projects below the inferior margin of the image. The right internal jugular venous catheter tip projects at the junction of the middle and distal thirds of the SVC.  IMPRESSION: 1. The left-sided chest tube proximal port remains outside the pleural space as reported previously. There is a stable 10% or less left-sided pneumothorax. The other support tubes and lines are in reasonable position. 2. Extensive subcutaneous emphysema bilaterally. The interstitial markings of both lungs are mildly prominent though stable. Stable left lower lobe atelectasis or pneumonia.   Electronically Signed   By: Lillan Mccreadie  [place] M.D.   On: 02/27/2015 07:33    ASSESSMENT / PLAN:  PULMONARY OETT 6/28 >>> A: Acute hypoxic respiratory failure. No improvement with abx ARDS Baseline dermatomyositis - s/p 2 cycles methotrexate and on daily prednisone.  Pneumomediastinum, B apical PTX's - extensive subq emphysema  P:   ARDS protocol; PEEP 10; FiO2 60% Bilat chest tubes  suction VAP bundle. Solumedrol  TID -- (started 7/2.) Initiate Rituximab (7/7) F/u Daily ABG    Repeat BAL culture and PCP smear - NGTD  CARDIOVASCULAR A:   Sinus tach - improved.  Hx HTN  P:  Lopressor PRN. Hold outpatient olmesartan. Monitor CVP  F/u electrolytes   RENAL A:   AKI  Hyperkalemia - 6.0 (7/7) Mixed acidosis - lactate normal  Hypernatremia  P:   Lasix  IV x2 doses Continue electrolyte correction w/ free water  - increased D5/free water to 175ml/hr BMP today and in AM  GASTROINTESTINAL A:   GERD. Nutrition. P:   SUP: Famotidine. TF  HEMATOLOGIC  A:   Chronic anemia - no s/s bleeding.  Hgb stable .7 7/7.  NO obvious s/s bleeding.   VTE Prophylaxis. P:  SCD's SQ heparin Hgb appears stable at this time. Transfuse for hgb <7  INFECTIOUS A:   HCAP unlikely  Wbc trending up, 19.4 (7/7) P:   BCx2 6/28 > neg to date BAL 6/28 > normal flora BAL 7/5 > PENDING; NGTD RVP 6/29 >> negative U. Strep 6/28 > negative U. Legionella 6/28 > negative PCP smear 6/28 > negative Abx: Vanc, start date 6/28 >> DC 7/4 Abx: Ceftaz, start date 6/28 >> DC 7/5 myco 7/2>>> DC 7/5  Infectious Disease consulted: appreciate input. Feels infectious process is unlikely Wbc trending up; remains on steroids   ENDOCRINE A:   DM.  Significant hyperglycemia with addition high dose steroids (improving w/ Lantus) P:   Insulin drip  Lantus 40u BID  NEUROLOGIC A:   Acute encephalopathy due to sedation. Paralyzed  P:   Sedation: Fentanyl/versed  RASS goal -2 Hold outpatient gabapentin, norco.  RHEUMATOLOGIC A: Baseline dermatomyositis - s/p 2 cycles methotrexate and on daily prednisone. P: Continue Solumedrol. Initiate rituximab CT results: findings seem c/w dermatomyositis Followed by [name] as outpatient  Family updated:  Family updated daily at bedside.   Interdisciplinary Family Meeting v Palliative Care Meeting:  Due by: 02/24/15.   Kathee Delton, MD,MS,  PGY2 02/27/2015 1:45 PM    PCCM ATTENDING: I have reviewed pt's initial presentation, consultants notes and hospital database in detail.  The above  assessment and plan was formulated under my direction.  In summary: He continues to have severe ARDS with severe SQ emphysema and pneumomediastinum.  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

## 2015-02-27 NOTE — Progress Notes (Signed)
CRITICAL VALUE ALERT  Critical value received:  K+ 6.1   Date of notification:  02/27/15  Time of notification:  12:40  Critical value read back:Yes.    Nurse who received alert:  Wende NeighborsSara Demonie Kassa  MD notified (1st page):  MD Arsenio LoaderSommer  Time of first page:  12:44  MD notified (2nd page):  Time of second page:  Responding MD:  MD Arsenio LoaderSommer  Time MD responded:  12:44

## 2015-02-27 NOTE — Procedures (Signed)
Chest Tube Insertion Procedure Note  Indications:  Clinically significant Pneumothorax and WORSENING SUBCUT AIR ON RIGHT SIDE  OPERATOR  1. DR, Lavinia SharpsM Lorrain Rivers - MD - PRIMARY OPERATOR  2. MR PAUL HOFFMAN - APP AS ASSIST  Pre-operative Diagnosis: Pneumothorax  Post-operative Diagnosis: Pneumothorax  Procedure Details  Informed consent was obtained for the procedure, including sedation from the wife over phone from bedside.  Risks of lung perforation, hemorrhage, arrhythmia, and adverse drug reaction were discussed.   After sterile skin prep, using standard technique, a 32 French tube was placed in the right anterior 4th rib space. - NOTE initially TRIED TO USE 455f TUBE WHICH HAD 7 PORTS BUT OUR INSERTION SITE WAs A BIT TOO PROXIMAL SO WE DOWNSIZED TO 11F  Findings: SOME AIR GUSH NOTED  Estimated Blood Loss:  NONE         Specimens:  None              Complications:  None; patient tolerated the procedure well.         Disposition: ICU - intubated and critically ill.         Condition: CRITICALLY ILL WITH UNCHANGED STATUS  Attending Attestation: I performed the procedure.   Dr. Kalman ShanMurali Huston Stonehocker, M.D., [facility] Center[facility].C.P Pulmonary and Critical Care Medicine Staff Physician [facility] Pager: [phone], If no answer or between  15:00h - 7:00h: call [phone]  02/27/2015 5:06 AM

## 2015-02-27 NOTE — Progress Notes (Signed)
Notified Darliss CheneyLiz Elink RN in regards to K 6.0 from a BMET at 1800. Previous BMETs have similar results in regards to BMET. Marisue IvanLiz RN said that will pass on to [facility]. Will continue to monitor and assess.

## 2015-02-27 NOTE — Progress Notes (Addendum)
PHARMACY CONSULT: Rituximab Indication: Dermatomyostitis  Assessment 59 yo male with recent diagnosis of dermatomyositis. Pt was reportedly on methotrexate and prednisone as an outpatient without improvement in symptoms. He received high dose steroids (solu-medrol 1 g daily x3 days) from July 2-4 while hospitalized.  He remains on solu-medrol 80 mg IV tid.  Due to lack of improvement, Rituxan will be initiated. Studies have demonstrated slow disease resolution with Rituxan.  Additionally, the pt will be at an increased risk of infection while he is on Rituxan and corticosteroids.   Pt is intubated, received a full course of abx for HCAP and remains culture negative.  6/28 Vanc>>7/4 6/28 Ceftaz>> 7/5 7/2 micafungin>> 7/5  Repeat PCP >> 7/5 BAL >> ngtd 6/28 BCx2>>NGTD 6/28 RSV>>NGTD  6/28 PCP>> negative 6/28: RVP negative 6/28: BAL - normal flora 6/28: MRSA positive  Plan -Rituximab 1 g IV q2weeks x 2 doses then reassess for an additional course -Monitor CBC, Scr, s/sx of infection   Agapito GamesAlison Brenisha Tsui, PharmD, BCPS Clinical Pharmacist Pager: [phone] 02/27/2015 2:04 PM

## 2015-02-28 ENCOUNTER — Inpatient Hospital Stay (HOSPITAL_COMMUNITY): Payer: 59

## 2015-02-28 LAB — POCT I-STAT 3, ART BLOOD GAS (G3+)
Acid-Base Excess: 2 mmol/L (ref 0.0–2.0)
Acid-Base Excess: 4 mmol/L — ABNORMAL HIGH (ref 0.0–2.0)
BICARBONATE: 28.8 meq/L — AB (ref 20.0–24.0)
BICARBONATE: 31.5 meq/L — AB (ref 20.0–24.0)
O2 Saturation: 80 %
O2 Saturation: 100 %
PH ART: 7.264 — AB (ref 7.350–7.450)
Patient temperature: 99
TCO2: 31 mmol/L (ref 0–100)
TCO2: 34 mmol/L (ref 0–100)
pCO2 arterial: 58.8 mmHg (ref 35.0–45.0)
pCO2 arterial: 69.8 mmHg (ref 35.0–45.0)
pH, Arterial: 7.3 — ABNORMAL LOW (ref 7.350–7.450)
pO2, Arterial: 51 mmHg — ABNORMAL LOW (ref 80.0–100.0)
pO2, Arterial: 247 mmHg — ABNORMAL HIGH (ref 80.0–100.0)

## 2015-02-28 LAB — BASIC METABOLIC PANEL
ANION GAP: 5 (ref 5–15)
Anion gap: 5 (ref 5–15)
BUN: 84 mg/dL — ABNORMAL HIGH (ref 6–20)
BUN: 81 mg/dL — ABNORMAL HIGH (ref 6–20)
CALCIUM: 8 mg/dL — AB (ref 8.9–10.3)
CHLORIDE: 115 mmol/L — AB (ref 101–111)
CO2: 29 mmol/L (ref 22–32)
CO2: 29 mmol/L (ref 22–32)
CREATININE: 1.33 mg/dL — AB (ref 0.61–1.24)
CREATININE: 1.43 mg/dL — AB (ref 0.61–1.24)
Calcium: 7.8 mg/dL — ABNORMAL LOW (ref 8.9–10.3)
Chloride: 113 mmol/L — ABNORMAL HIGH (ref 101–111)
GFR calc Af Amer: 60 mL/min — ABNORMAL LOW (ref >60)
GFR calc non Af Amer: 51 mL/min — ABNORMAL LOW (ref >60)
GFR, EST NON AFRICAN AMERICAN: 56 mL/min — AB (ref >60)
Glucose, Bld: 170 mg/dL — ABNORMAL HIGH (ref 65–99)
Glucose, Bld: 167 mg/dL — ABNORMAL HIGH (ref 65–99)
Potassium: 6.2 mmol/L (ref 3.5–5.1)
Potassium: 5.8 mmol/L — ABNORMAL HIGH (ref 3.5–5.1)
Sodium: 149 mmol/L — ABNORMAL HIGH (ref 135–145)
Sodium: 147 mmol/L — ABNORMAL HIGH (ref 135–145)

## 2015-02-28 LAB — CBC WITH DIFFERENTIAL/PLATELET
BASOS PCT: 0 % (ref 0–1)
Basophils Absolute: 0 10*3/uL (ref 0.0–0.1)
EOS PCT: 0 % (ref 0–5)
Eosinophils Absolute: 0 10*3/uL (ref 0.0–0.7)
HEMATOCRIT: 29.5 % — AB (ref 39.0–52.0)
HEMOGLOBIN: 9.1 g/dL — AB (ref 13.0–17.0)
LYMPHS ABS: 0.4 10*3/uL — AB (ref 0.7–4.0)
Lymphocytes Relative: 2 % — ABNORMAL LOW (ref 12–46)
MCH: 26.9 pg (ref 26.0–34.0)
MCHC: 30.8 g/dL (ref 30.0–36.0)
MCV: 87.3 fL (ref 78.0–100.0)
MONO ABS: 0.6 10*3/uL (ref 0.1–1.0)
Monocytes Relative: 3 % (ref 3–12)
Neutro Abs: 18.7 10*3/uL — ABNORMAL HIGH (ref 1.7–7.7)
Neutrophils Relative %: 95 % — ABNORMAL HIGH (ref 43–77)
Platelets: 308 10*3/uL (ref 150–400)
RBC: 3.38 MIL/uL — ABNORMAL LOW (ref 4.22–5.81)
RDW: 21.2 % — ABNORMAL HIGH (ref 11.5–15.5)
WBC: 19.7 10*3/uL — ABNORMAL HIGH (ref 4.0–10.5)

## 2015-02-28 LAB — GLUCOSE, CAPILLARY
GLUCOSE-CAPILLARY: 160 mg/dL — AB (ref 65–99)
GLUCOSE-CAPILLARY: 161 mg/dL — AB (ref 65–99)
GLUCOSE-CAPILLARY: 168 mg/dL — AB (ref 65–99)
GLUCOSE-CAPILLARY: 174 mg/dL — AB (ref 65–99)
GLUCOSE-CAPILLARY: 178 mg/dL — AB (ref 65–99)
GLUCOSE-CAPILLARY: 201 mg/dL — AB (ref 65–99)
Glucose-Capillary: 149 mg/dL — ABNORMAL HIGH (ref 65–99)
Glucose-Capillary: 160 mg/dL — ABNORMAL HIGH (ref 65–99)
Glucose-Capillary: 179 mg/dL — ABNORMAL HIGH (ref 65–99)
Glucose-Capillary: 194 mg/dL — ABNORMAL HIGH (ref 65–99)
Glucose-Capillary: 179 mg/dL — ABNORMAL HIGH (ref 65–99)
Glucose-Capillary: 142 mg/dL — ABNORMAL HIGH (ref 65–99)
Glucose-Capillary: 147 mg/dL — ABNORMAL HIGH (ref 65–99)
Glucose-Capillary: 150 mg/dL — ABNORMAL HIGH (ref 65–99)
Glucose-Capillary: 157 mg/dL — ABNORMAL HIGH (ref 65–99)
Glucose-Capillary: 160 mg/dL — ABNORMAL HIGH (ref 65–99)
Glucose-Capillary: 160 mg/dL — ABNORMAL HIGH (ref 65–99)
Glucose-Capillary: 161 mg/dL — ABNORMAL HIGH (ref 65–99)
Glucose-Capillary: 163 mg/dL — ABNORMAL HIGH (ref 65–99)
Glucose-Capillary: 164 mg/dL — ABNORMAL HIGH (ref 65–99)
Glucose-Capillary: 172 mg/dL — ABNORMAL HIGH (ref 65–99)
Glucose-Capillary: 143 mg/dL — ABNORMAL HIGH (ref 65–99)
Glucose-Capillary: 170 mg/dL — ABNORMAL HIGH (ref 65–99)

## 2015-02-28 LAB — CULTURE, RESPIRATORY: CULTURE: NO GROWTH

## 2015-02-28 LAB — CULTURE, RESPIRATORY W GRAM STAIN

## 2015-02-28 LAB — GLUCOSE 6 PHOSPHATE DEHYDROGENASE
G6PDH: 17.8 U/g{Hb} — ABNORMAL HIGH (ref 4.6–13.5)
Hemoglobin: 9.5 g/dL — ABNORMAL LOW (ref 12.6–17.7)

## 2015-02-28 MED ORDER — SENNOSIDES 8.8 MG/5ML PO SYRP
10.0000 mL | ORAL_SOLUTION | Freq: Two times a day (BID) | ORAL | Status: DC
  Administered 2015-02-28 – 2015-03-01 (×2): 10 mL
  Filled 2015-02-28 (×4): qty 10

## 2015-02-28 MED ORDER — SODIUM POLYSTYRENE SULFONATE 15 GM/60ML PO SUSP
15.0000 g | Freq: Once | ORAL | Status: AC
  Administered 2015-02-28: 15 g
  Filled 2015-02-28: qty 60

## 2015-02-28 MED ORDER — FUROSEMIDE 10 MG/ML IJ SOLN
80.0000 mg | Freq: Four times a day (QID) | INTRAMUSCULAR | Status: AC
  Administered 2015-02-28 (×2): 80 mg via INTRAVENOUS
  Filled 2015-02-28 (×2): qty 8

## 2015-02-28 MED ORDER — SODIUM CHLORIDE 0.9 % IV SOLN
INTRAVENOUS | Status: DC
  Filled 2015-02-28 (×2): qty 80

## 2015-02-28 MED ORDER — SODIUM CHLORIDE 0.9 % IV SOLN: 1.0000 mg/h | INTRAVENOUS | Status: DC

## 2015-02-28 MED ORDER — SODIUM CHLORIDE 0.9 % IV SOLN
INTRAVENOUS | Status: DC
  Administered 2015-02-28 – 2015-03-01 (×5): via INTRAVENOUS
  Filled 2015-02-28 (×10): qty 80

## 2015-02-28 NOTE — Progress Notes (Signed)
Radiologist MD Curnes called to notify critical results in regards to chest tube right and left malposition. Relayed message to Acey LavPhyllis McKinney RN.

## 2015-02-28 NOTE — Discharge Summary (Deleted)
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, daily prednisone 60 mg/day  Note: this diagnosis was called into question during personal communication with Dr Nickola Major (Rheumatology) Acute hypoxic respiratory failure Persistent pulmonary infiltrates/pneumonitis/ARDS   Working dx has been pneumonitis related to dermatomyositis Pneumomediastinum Bilateral pneumothorax  Minimal air leak on chest tubes  Pleural adhesions appear to be preventing significant lung collapse Severe subcutaneous emphysema Doubt HCAP - PCT normal X 3 Sinus tachycardia, reactive H/O hypertension AKI (Cr 1.74, baseline 0.97) - nonoliguric  Cr 1.33 on 7/08 Hyperkalemia Hypocalcemia - corrects to normal (albumin 1.6 on 7/03) Anemia of chronic disease Steroid induced leukocytosis DM 2 Acute encephalopathy due to critical illness and sedatives  PRESENTATION:   Pt was admitted by [facility] service with the following HPI and the above admission diagnoses:  HISTORY OF PRESENT ILLNESS: Pt is encephalopathic; therefore, this HPI is obtained from chart review. Derek Anthony is a 59 y.o. M with PMH as outlined below including recent diagnosis of dermatomyositis (diagnosed by [name] with rheumatology). He is s/p 2 cycles of Methotrexate last being 6/13 and has since been on prednisone.He had recent admission for dyspnea and was treated with pulse dose steroids as well as empiric abx for CAP. He was discharged 02/13/15 with follow up scheduled with both pulmonology 6/28 ([name]) and rheumatology 6/30 (Dr.  Nickola Major).  On afternoon of 6/28, pt was profoundly more dyspneic and could not ambulate more than a few feet without having to stop and rest. Per wife, he actually fell twice due to weakness and imbalance. He has apparently felt bad since being discharged from the hospital and has progressively gotten worse. He has had persistent cough productive of yellow sputum as well as generalized weakness and dyspnea with occasional wheezing. He denies any fevers/chills/sweats, chest pain, hemoptysis, N/V/D, abdominal pain, myalgias, LE swelling / pain. Denies any recent travel. Was immobilized for almost 48 hours this past weekend due to weakness and he basically slept all weekend long  On ED arrival, he was hypoxic with sats in the 50's on RA. He was also tachypneic in the 40's with sinus tach, HR of 120's. He was placed on BiPAP which pt subjectively felt helped his symptoms.  PCCM was called and asked to evaluate for admission.  Hospital records from prior to admission:  11/06/14 Skin biopsy: COMMENT: These changes are not entirely diagnostic, although they are suggestive of a subtle interface dermatitis. These findings may indicate an underlying collagen vascular disease such as dermatomyositis. Clinicopathologic correlation is recommended. 12/23/14 Muscle biopsy: no inflammation noted 02/06/15 BAL fluid: Tissue-Flow Cytometry CD4:CD8 RATIO IS 0.9:1. No malignant cells indentified  HOSPITAL COURSE:   6/28 admitted as above. Intubated. Underwent FOB with BAL. Empiric abx initiated for possible HCAP. High dose systemic steroids initiated 6/29 lung protective vent strategy - FiO2 80%, PEEP 10 cm H2O 7/01 CXR: Tiny left apical pneumothorax. Small right apical pneumothorax. Suspected pneumomediastinum. Associated subcutaneous emphysema. Bilateral pigtail pleural catheters placed. F/U CXR report: New bilateral chest tubes. Minimal residual pneumothorax on the right with no convincing residual pneumothorax  on the left 7/02 worsening subq air, worsening ABG,  NMB initiated  7/03 improved resp acidosis. High dose methlypred initiated - 1000 mg dail X 3. Insulin gtt initiated for severe hyperglycemia 7/05 methylpred dose reduced to 80 mg q 8 hrs 7/05 ID consultation requested: Discontinue ceftazidime and micafungin and observe off of anabiotic for now. Check serum LDH (elevated at 740) 7/06 L pleural cath migrated out of thoracic cavity. 28 FR chest tube placed on R. Severe pleural adhesions noted 7/06 CT chest: Extensive pneumomediastinum. Diffuse chest wall subcutaneous emphysema. Small left pneumothorax. The side port for the left chest tube may be within the chest wall and outside of the left pleural space. Pneumoperitoneum. Interstitial and airspace opacities compatible with ARDS  7/07 AM: worsening subQ emphysema. R chest tube placed with some difficulty - difficult to advance 7/07 Rituximab ordered after phone consultation with rheumatology (Dr Nickola Major) 7/08 The following was documented on CCM rounds:  Overall remains unchanged with working dx of dermatomyositis, severe pneumonitis/ARDS requiring high level of vent support, persistent SQ emphysema and pneumomediastinum, hypernatremia, hyperkalemia (despite furosemide), severe hyperglycemia requiring insulin gtt. He remains hemodynamically stable  Remains on ARDS protocol. The R chest tube has migrated out of thoracic space and is now removed. There is minimal R pneumothorax after chest tube removal. Remains on systemic steroids (methypred 80 mg q 8 hrs). Rituximab was ordered 7/07 but first dose is being administered today. Will administer further furosemide today. He is off all abx per ID recommendations. It appears that the 2nd pneumocystis smear ordered previously was not performed. This has been re-ordered (blind BAL) today (7/08). He continues to require heavy sedation for vent synchrony and did receive a single dose of vecuronium overnight.  I  have discussed with Dr Kendrick Fries and we agree that he might be best served @ [facility] where he can receive rheumatology consultation. He might require replacement of chest tube and I have communicated to the receiving MD that these have been very difficult to place due to pleural adhesions.  I have discussed with wife who is agreeable to transfer to [facility]  INDWELLING DEVICES:: ETT 6/28 >>  R IJ CVL 6/28 >>  R radial A-line 6/30 >>   MICRO: MRSA PCR (nasal swab) 6/28 >> POS Blood 6/28 > NEG BAL 6/28 > no yeast, cx in progress      > AFB smear neg, cx in progress      > Pneumocystis smear > NEG      > resp virus panel > NEG      > nonpathogenic oropharyngeal flora U. Strep Ag 6/28 > negative U. Legionella Ag 6/28 > negative Pneumocystis smear 7/08 >>   ANTIBIOTICS Vanc 6/28 >> 7/04 Ceftaz 6/28 >> 7/05 mycofungin 70/2 >> 7/05  CURRENT MEDS: Scheduled Meds: . antiseptic oral rinse  7 mL Mouth Rinse QID  . artificial tears  1 application Both Eyes 3 times per day  . chlorhexidine  15 mL Mouth Rinse BID  . famotidine  20 mg Per Tube BID  . fentaNYL (SUBLIMAZE) injection  50 mcg Intravenous Once  . folic acid  1 mg Per Tube Daily  . free water  300 mL Per Tube Q4H  . heparin  5,000 Units Subcutaneous 3 times per day  . insulin glargine  40 Units Subcutaneous BID  . methylPREDNISolone (SOLU-MEDROL) injection  80 mg Intravenous 3 times per day  . midazolam  2 mg Intravenous Once  . riTUXimab (RITUXAN)  NON-ONCOLOGY  infusion  1,000 mg Intravenous Q14 Days  . sennosides  10 mL Per Tube  BID   Continuous Infusions: . dextrose 100 mL/hr at 02/28/15 1005  . feeding supplement (VITAL AF 1.2 CAL) 1,000 mL (02/28/15 1202)  . fentaNYL infusion INTRAVENOUS 400 mcg/hr (02/28/15 1005)  . insulin (NOVOLIN-R) infusion 5.8 mL/hr at 02/28/15 1330  . Midazolam (Versed) 100mg  in sodium chloride 0.9% 100 mL infusion 10 mL/hr at 02/28/15 1300   PRN Meds:.acetaminophen, fentaNYL, hydrALAZINE,  levalbuterol, metoprolol, midazolam, vecuronium  I have discussed with [facility] Steele BergBarkauskas[facility]  Billy Fischeravid Prince Olivier, MD;  PCCM service; Mobile [phone]

## 2015-02-28 NOTE — Procedures (Signed)
Mini BAL Procedure Note Jerline PainDurwood A Fotopoulos [id number] 1956-01-20  Procedure: Mini Bronchial Alveolar Lavage  Procedure Details: In preparation for procedure, Patient hyper-oxygenated with 100 % FiO2 Sterile Technique used:gloves, gown and mask Amount of Saline administered: 40 (ml) Specimen amount collected: 5 (ml)  Evaluation: BP 121/75 mmHg  Pulse 101  Temp(Src) 99.1 F (37.3 C) (Oral)  Resp 35  Ht 6\' 1"  (1.854 m)  Wt 251 lb 15.8 oz (114.3 kg)  BMI 33.25 kg/m2  SpO2 98% O2 sats: stable throughout Breath Sounds: Diminished Patient's Current Condition: stable Complications: No apparent complications Patient did tolerate procedure well.   Melanee Spryelson, Dawid Dupriest Lawson 02/28/2015, 12:44 PM

## 2015-02-28 NOTE — Progress Notes (Signed)
CRITICAL VALUE ALERT  Critical value received:  K 6.2   Date of notification:  02/28/15  Time of notification:  0440  Critical value read back: Yes  Nurse who received alert: Hazel Samshristian Aleese Kamps RN   MD notified (1st page):  MD Caroleen Hammanumley   Time of first page:  [phone]   MD notified (2nd page):  Time of second page:  Responding MD:  MD Caroleen Hammanumley   Time MD responded:  [phone]

## 2015-02-28 NOTE — Progress Notes (Signed)
PULMONARY / CRITICAL CARE MEDICINE   Name: Jerline PainDurwood A Wist MRN: [id number] DOB: Jul 17, 1956    ADMISSION DATE:  02/18/2015 CONSULTATION DATE:  02/28/2015   REFERRING MD :  EDP  CHIEF COMPLAINT:  Dyspnea, respiratory distress  STUDIES:  FOB 6/28 >>> BAL culture - neg, PCP - neg, AFB - neg, Cell Ct - 463 WBC, 65% mono, 27% pmn, 8% lymph, 0% eos, Cytology - neg for CA , Flow Cytometry - non diagnostic, BAL Fungal Culture - neg CT Chest 7/6 >> pneumomediastinum; SQ emphysema; small Lt pneumothorax; pneumoperitoneum; ARDS  SIGNIFICANT EVENTS: 6/14 through 6/23 - admitted. 6/28 - re-admitted with worsening hypoxic respiratory failure. 7/1 B chest tubes placed (pigtails) 7/2: worsening subq air, worsening abg, on nimbex, hypertensive, tachycardic. High dose steroids initiated 7/6: Lt pig tail replaced w/ chest tube;  7/7: Rt pigtail replaced w/ chest tube. Patient's Rheumatologist contacted (Dr. Nickola MajorHawkes): Rituximab would be her choice if deemed necessary; consider transfer to tertiary center. 7/8: Initiate Rituximab   SUBJECTIVE:  Hemodynamically stable. Fluid input very high; +1806500ml overall. Rt chest tube removed. Lt tube in place. No acute events. Consideration for transfer to tertiary center.   VITAL SIGNS: Temp:  [97.7 F (36.5 C)-99.1 F (37.3 C)] 99.1 F (37.3 C) (07/08 1139) Pulse Rate:  [90-116] 101 (07/08 1214) Resp:  [19-38] 35 (07/08 1214) BP: (121-150)/(63-80) 121/75 mmHg (07/08 1214) SpO2:  [86 %-100 %] 98 % (07/08 1214) Arterial Line BP: (134-191)/(59-88) 139/66 mmHg (07/08 1100) FiO2 (%):  [50 %-60 %] 50 % (07/08 1214) Weight:  [251 lb 15.8 oz (114.3 kg)] 251 lb 15.8 oz (114.3 kg) (07/08 0333) HEMODYNAMICS: CVP:  [5 mmHg-12 mmHg] 12 mmHg VENTILATOR SETTINGS: Vent Mode:  [-] PRVC FiO2 (%):  [50 %-60 %] 50 % Set Rate:  [35 bmp] 35 bmp Vt Set:  [480 mL] 480 mL PEEP:  [10 cmH20] 10 cmH20 Plateau Pressure:  [27 cmH20-32 cmH20] 32 cmH20 INTAKE / OUTPUT: Intake/Output       07/07 0701 - 07/08 0700 07/08 0701 - 07/09 0700   I.V. (mL/kg) 3367.2 (29.5) 600 (5.2)   Other     NG/GT 2340 141   Total Intake(mL/kg) 5707.2 (49.9) 741 (6.5)   Urine (mL/kg/hr) 3925 (1.4) 1400 (2.3)   Stool  0 (0)   Chest Tube 30 (0)    Total Output 3955 1400   Net +1752.2 -659        Stool Occurrence  1 x     PHYSICAL EXAMINATION: General: WDWN male, sedated/intubated HEENT: PERRL. No scleral icterus. Extensive SQ air throughout thorax and neck. Cardiovascular: RRR, no M/R/G.  Lungs: Respirations even and unlabored on vent. LT chest tube in place.  Abdomen: BS present, soft, some slight distention present.  Musculoskeletal: No gross deformities Skin: Significant nonpitting edema in bilateral UE; Lt hand is slightly cool Neuro: sedated, on vent  LABS:  CBC  Recent Labs Lab 02/26/15 0415 02/27/15 0550 02/28/15 0345  WBC 18.8* 19.4* 19.7*  HGB 8.5* 8.7* 9.1*  HCT 28.8* 29.8* 29.5*  PLT 240 324 308   Coag's No results for input(s): APTT, INR in the last 168 hours. BMET  Recent Labs Lab 02/27/15 0550 02/27/15 1800 02/28/15 0345  NA 150* 148* 149*  K 6.0* 6.0* 6.2*  CL 116* 115* 115*  CO2 28 28 29   BUN 83* 85* 84*  CREATININE 1.49* 1.52* 1.33*  GLUCOSE 194* 192* 170*   Electrolytes  Recent Labs Lab 02/21/15 2234 02/22/15 0628  02/27/15 0550 02/27/15 1800  02/28/15 0345  CALCIUM  --  8.2*  < > 8.1* 8.0* 8.0*  MG 2.9* 3.0*  --   --   --   --   PHOS 4.6 6.1*  --   --   --   --   < > = values in this interval not displayed. Sepsis Markers  Recent Labs Lab 02/23/15 0150  LATICACIDVEN 1.5   ABG  Recent Labs Lab 02/24/15 2334 02/25/15 0410 02/26/15 0438  PHART 7.279* 7.305* 7.284*  PCO2ART 60.1* 56.7* 56.6*  PO2ART 87.0 67.0* 67.0*   Liver Enzymes  Recent Labs Lab 02/23/15 0445 02/27/15 0550  AST 14* 26  ALT 20 24  ALKPHOS 104 114  BILITOT 0.5 0.4  ALBUMIN 1.6* 1.9*   Cardiac Enzymes No results for input(s): TROPONINI,  PROBNP in the last 168 hours. Glucose  Recent Labs Lab 02/27/15 2020 02/27/15 2130 02/27/15 2248 02/27/15 2345 02/28/15 0043 02/28/15 0140  GLUCAP 178* 194* 149* 201* 160* 179*    Imaging Dg Chest Port 1 View  02/28/2015   CLINICAL DATA:  Continued assessment of respiratory failure.  EXAM: PORTABLE CHEST - 1 VIEW  COMPARISON:  02/27/2015.  FINDINGS: Support apparatus stable. Extensive subcutaneous emphysema is redemonstrated. In addition, there is suspected pneumopericardium.  RIGHT chest tube appears to have been partially withdrawn from the chest and lies outside the rib cage on the RIGHT, completely outside the pleural space, and appears kinked within the soft tissues (circle). There may be a small basilar pneumothorax on the RIGHT but no significant apical pneumothorax.  A LEFT chest tube remains partially in place although the last side hole (arrow) remains outside the chest as reported previously. There is a small LEFT apical pneumothorax, 5-10%, not significantly worse from yesterday.  Endotracheal tube stable position 3 cm above carina. RIGHT IJ catheter at the cavoatrial junction, stable. Orogastric tube is in the stomach.  Coarse interstitial markings of both lungs are stable. Retrocardiac opacity could represent early LEFT lower lobe pneumonia.  IMPRESSION: Malposition now of both chest tubes as described. The RIGHT chest tube is completely outside the pleural space. LEFT chest tube side hole is outside the pleural space and contributes to extensive subcutaneous emphysema. Stable 5-10% LEFT apical pneumothorax, with increasingly prominent RIGHT basilar pneumothorax and pneumopericardium.  Critical Value/emergent results were called by telephone at the time of interpretation on 02/28/2015 at 7:36 am to Ozan, patient's nurse, who verbally acknowledged these results.   Electronically Signed   By: Elsie Stain M.D.   On: 02/28/2015 07:38    ASSESSMENT / PLAN:  PULMONARY OETT 6/28  >>> A: Acute hypoxic respiratory failure. No improvement with abx ARDS Baseline dermatomyositis - s/p 2 cycles methotrexate and on daily prednisone.  Pneumomediastinum, B apical PTX's - extensive subq emphysema  P:   ARDS protocol; PEEP 10; FiO2 60% Lt chest tube  suction Rt chest tube removed, consider replacement VAP bundle. Solumedrol  TID -- (started 7/2.) Rituximab (7/8) Repeat BAL culture and PCP smear - NGTD  CARDIOVASCULAR A:  Sinus tach - resolved Hx HTN  Sluggish cap refill - UE P:  Lopressor PRN. Hold outpatient olmesartan. Monitor CVP  F/u electrolytes  Consider UE dopplers  RENAL A:   AKI - improving Hyperkalemia - 6.2 (7/8) Mixed acidosis - lactate normal  Fluid retention P:   Lasix  IV x 2 doses today S/p Kayexalate  Continue electrolyte correction w/ free water BMP today (2pm) and in AM  GASTROINTESTINAL A:   GERD. Nutrition. P:  SUP: Famotidine. Senna BID TF  HEMATOLOGIC  A:   Chronic anemia - no s/s bleeding.  Hgb stable @9 .1, 7/8.  NO obvious s/s bleeding.   VTE Prophylaxis. P:  SCD's SQ heparin Hgb appears stable at this time. Transfuse for hgb <7  INFECTIOUS A:   Wbc trending up, 19.7 (7/8) P:   BCx2 6/28 > neg to date BAL 6/28 > normal flora BAL 7/5 > PENDING; NGTD RVP 6/29 >> negative U. Strep 6/28 > negative U. Legionella 6/28 > negative PCP smear 6/28 > negative Abx: Vanc, start date 6/28 >> DC 7/4 Abx: Ceftaz, start date 6/28 >> DC 7/5 myco 7/2>>> DC 7/5 Infectious Disease consulted: Infectious process is unlikely  ENDOCRINE A:   DM - improved w/ Lantus P:   Insulin drip  Lantus 40u BID  NEUROLOGIC A:   Acute encephalopathy due to sedation. P:   Sedation: Fentanyl/versed  RASS goal -4  RHEUMATOLOGIC A: Baseline dermatomyositis  P: Continue Solumedrol. Rituximab (7/8) CT results: findings seem c/w dermatomyositis Followed by [name] as outpatient  Family updated:  Family  updated daily at bedside.   Interdisciplinary Family Meeting v Palliative Care Meeting:  Due by: 02/24/15.   Kathee Delton, MD,MS,  PGY2 02/28/2015 12:16 PM    PCCM ATTENDING: I have reviewed pt's initial presentation, consultants notes and hospital database in detail.  The above assessment and plan was formulated under my direction.  In summary: Overall remains unchanged with working dx of dermatomyositis, severe pneumonitis/ARDS requiring high level of vent support, persistent SQ emphysema and pneumomediastinum, hypernatremia, hyperkalemia (despite furosemide), severe hyperglycemia requiring insulin gtt. He remains hemodynamically stable  Remains on ARDS protocol. The R chest tube has migrated out of thoracic space and is now removed. There is minimal R pneumothorax after chest tube removal. Remains on systemic steroids (methypred 80 mg q 8 hrs). Rituximab was ordered 7/07 but first dose is being administered today. Will administer further furosemide today. He is off all abx per ID recommendations. It appears that the pneumocystis smear ordered previously was not performed. This has been re-ordered (blind BAL) today. He continues to require heavy sedation for vent synchrony and did receive a single dose of vecuronium overnight.  I have discussed with Dr Kendrick Fries and we agree that he might be best served @ [facility] where he can receive rheumatology consultation. He might require replacement of chest tube and I have communicated to the receiving MD that these have been very difficult to place due to pleural adhesions.  I have discussed with wife who is agreeable to transfer to [facility]   60 minutes of independent CCM time was provided by me   Billy Fischer, MD;  PCCM service; Mobile [phone]

## 2015-03-01 ENCOUNTER — Inpatient Hospital Stay (HOSPITAL_COMMUNITY): Payer: 59

## 2015-03-01 DIAGNOSIS — I4891 Unspecified atrial fibrillation: Secondary | ICD-10-CM

## 2015-03-01 LAB — CBC WITH DIFFERENTIAL/PLATELET
BASOS ABS: 0 10*3/uL (ref 0.0–0.1)
Basophils Relative: 0 % (ref 0–1)
Eosinophils Absolute: 0 10*3/uL (ref 0.0–0.7)
Eosinophils Relative: 0 % (ref 0–5)
HCT: 28.8 % — ABNORMAL LOW (ref 39.0–52.0)
Hemoglobin: 8.5 g/dL — ABNORMAL LOW (ref 13.0–17.0)
Lymphocytes Relative: 3 % — ABNORMAL LOW (ref 12–46)
Lymphs Abs: 0.5 10*3/uL — ABNORMAL LOW (ref 0.7–4.0)
MCH: 25.4 pg — ABNORMAL LOW (ref 26.0–34.0)
MCHC: 29.5 g/dL — AB (ref 30.0–36.0)
MCV: 86.2 fL (ref 78.0–100.0)
MONOS PCT: 3 % (ref 3–12)
Monocytes Absolute: 0.5 10*3/uL (ref 0.1–1.0)
NEUTROS PCT: 94 % — AB (ref 43–77)
Neutro Abs: 16.2 10*3/uL — ABNORMAL HIGH (ref 1.7–7.7)
Platelets: 302 10*3/uL (ref 150–400)
RBC: 3.34 MIL/uL — ABNORMAL LOW (ref 4.22–5.81)
RDW: 20.5 % — ABNORMAL HIGH (ref 11.5–15.5)
WBC: 17.2 10*3/uL — ABNORMAL HIGH (ref 4.0–10.5)

## 2015-03-01 LAB — GLUCOSE, CAPILLARY
GLUCOSE-CAPILLARY: 126 mg/dL — AB (ref 65–99)
GLUCOSE-CAPILLARY: 134 mg/dL — AB (ref 65–99)
GLUCOSE-CAPILLARY: 141 mg/dL — AB (ref 65–99)
GLUCOSE-CAPILLARY: 142 mg/dL — AB (ref 65–99)
GLUCOSE-CAPILLARY: 145 mg/dL — AB (ref 65–99)
GLUCOSE-CAPILLARY: 153 mg/dL — AB (ref 65–99)
GLUCOSE-CAPILLARY: 181 mg/dL — AB (ref 65–99)
GLUCOSE-CAPILLARY: 278 mg/dL — AB (ref 65–99)
Glucose-Capillary: 159 mg/dL — ABNORMAL HIGH (ref 65–99)
Glucose-Capillary: 183 mg/dL — ABNORMAL HIGH (ref 65–99)
Glucose-Capillary: 176 mg/dL — ABNORMAL HIGH (ref 65–99)
Glucose-Capillary: 182 mg/dL — ABNORMAL HIGH (ref 65–99)
Glucose-Capillary: 150 mg/dL — ABNORMAL HIGH (ref 65–99)
Glucose-Capillary: 150 mg/dL — ABNORMAL HIGH (ref 65–99)
Glucose-Capillary: 143 mg/dL — ABNORMAL HIGH (ref 65–99)

## 2015-03-01 LAB — POCT I-STAT 3, ART BLOOD GAS (G3+)
BICARBONATE: 27 meq/L — AB (ref 20.0–24.0)
O2 SAT: 77 %
TCO2: 29 mmol/L (ref 0–100)
pCO2 arterial: 58.2 mmHg (ref 35.0–45.0)
pH, Arterial: 7.276 — ABNORMAL LOW (ref 7.350–7.450)
pO2, Arterial: 49 mmHg — ABNORMAL LOW (ref 80.0–100.0)

## 2015-03-01 LAB — BASIC METABOLIC PANEL
ANION GAP: 6 (ref 5–15)
BUN: 73 mg/dL — ABNORMAL HIGH (ref 6–20)
CALCIUM: 7.7 mg/dL — AB (ref 8.9–10.3)
CO2: 29 mmol/L (ref 22–32)
CREATININE: 1.32 mg/dL — AB (ref 0.61–1.24)
Chloride: 108 mmol/L (ref 101–111)
GFR calc Af Amer: >60 mL/min (ref >60)
GFR, EST NON AFRICAN AMERICAN: 57 mL/min — AB (ref >60)
GLUCOSE: 155 mg/dL — AB (ref 65–99)
Potassium: 5.7 mmol/L — ABNORMAL HIGH (ref 3.5–5.1)
Sodium: 143 mmol/L (ref 135–145)

## 2015-03-01 MED ORDER — LIDOCAINE HCL (PF) 1 % IJ SOLN
INTRAMUSCULAR | Status: AC
  Filled 2015-03-01: qty 5

## 2015-03-01 MED ORDER — DILTIAZEM LOAD VIA INFUSION
15.0000 mg | Freq: Once | INTRAVENOUS | Status: AC
  Administered 2015-03-01: 15 mg via INTRAVENOUS
  Filled 2015-03-01: qty 15

## 2015-03-01 MED ORDER — DILTIAZEM HCL 100 MG IV SOLR
10.0000 mg/h | INTRAVENOUS | Status: DC
  Administered 2015-03-01: 10 mg/h via INTRAVENOUS
  Administered 2015-03-01: 15 mg/h via INTRAVENOUS
  Filled 2015-03-01 (×2): qty 100

## 2015-03-01 MED ORDER — LIDOCAINE HCL (PF) 1 % IJ SOLN
INTRAMUSCULAR | Status: AC
  Administered 2015-03-01: 20 mL
  Filled 2015-03-01: qty 20

## 2015-03-01 MED ORDER — FREE WATER
300.0000 mL | Freq: Three times a day (TID) | Status: DC
  Administered 2015-03-01: 300 mL

## 2015-03-01 MED ORDER — SODIUM CHLORIDE 0.9 % IV SOLN: 1000.0000 mg | INTRAVENOUS | Status: DC

## 2015-03-01 MED ORDER — METOPROLOL TARTRATE 1 MG/ML IV SOLN: 5.0000 mg | Freq: Once | INTRAVENOUS | Status: DC | PRN

## 2015-03-01 MED ORDER — INSULIN ASPART 100 UNIT/ML ~~LOC~~ SOLN
0.0000 [IU] | SUBCUTANEOUS | Status: DC
  Administered 2015-03-01: 8 [IU] via SUBCUTANEOUS
  Administered 2015-03-01: 2 [IU] via SUBCUTANEOUS

## 2015-03-01 MED ORDER — FUROSEMIDE 10 MG/ML IJ SOLN
40.0000 mg | Freq: Four times a day (QID) | INTRAMUSCULAR | Status: AC
  Administered 2015-03-01 (×2): 40 mg via INTRAVENOUS
  Filled 2015-03-01 (×2): qty 4

## 2015-03-01 NOTE — Discharge Summary (Addendum)
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, daily prednisone 60 mg/day  Note: this diagnosis was called into question during personal communication with Dr Nickola Major (Rheumatology) Acute hypoxic respiratory failure Persistent pulmonary infiltrates/pneumonitis/ARDS   Working dx has been pneumonitis related to dermatomyositis Pneumomediastinum Bilateral pneumothorax  Minimal air leak on chest tubes  Pleural adhesions appear to be preventing significant lung collapse Severe subcutaneous emphysema Doubt HCAP - PCT normal X 3 Sinus tachycardia, reactive H/O hypertension AKI (Cr 1.74, baseline 0.97) - nonoliguric  Cr 1.33 on 7/08 Hyperkalemia Hypocalcemia - corrects to normal (albumin 1.6 on 7/03) Anemia of chronic disease Steroid induced leukocytosis DM 2 Acute encephalopathy due to critical illness and sedatives PAF with RVR (onset 7/09)  PRESENTATION:   Pt was admitted by [facility] service with the following HPI and the above admission diagnoses:  HISTORY OF PRESENT ILLNESS: Pt is encephalopathic; therefore, this HPI is obtained from chart review. Derek Anthony is a 59 y.o. M with PMH as outlined below including recent diagnosis of dermatomyositis (diagnosed by [name] with rheumatology). He is s/p 2 cycles of Methotrexate last being 6/13 and has since been on prednisone.He had recent admission for dyspnea and was treated with pulse dose steroids as well as empiric abx for CAP. He was discharged 02/13/15 with follow up scheduled with both pulmonology 6/28 ([name]) and  rheumatology 6/30 ([name]).  On afternoon of 6/28, pt was profoundly more dyspneic and could not ambulate more than a few feet without having to stop and rest. Per wife, he actually fell twice due to weakness and imbalance. He has apparently felt bad since being discharged from the hospital and has progressively gotten worse. He has had persistent cough productive of yellow sputum as well as generalized weakness and dyspnea with occasional wheezing. He denies any fevers/chills/sweats, chest pain, hemoptysis, N/V/D, abdominal pain, myalgias, LE swelling / pain. Denies any recent travel. Was immobilized for almost 48 hours this past weekend due to weakness and he basically slept all weekend long  On ED arrival, he was hypoxic with sats in the 50's on RA. He was also tachypneic in the 40's with sinus tach, HR of 120's. He was placed on BiPAP which pt subjectively felt helped his symptoms.  PCCM was called and asked to evaluate for admission.  Hospital records from prior to admission:  11/06/14 Skin biopsy: COMMENT: These changes are not entirely diagnostic, although they are suggestive of a subtle interface dermatitis. These findings may indicate an underlying collagen vascular disease such as dermatomyositis. Clinicopathologic correlation is recommended. 12/23/14 Muscle biopsy: no inflammation noted 02/06/15 BAL fluid: Tissue-Flow Cytometry CD4:CD8 RATIO IS 0.9:1. No malignant cells indentified  HOSPITAL COURSE:   6/28 admitted as above. Intubated. Underwent FOB with BAL. Empiric abx initiated for possible HCAP. High dose systemic steroids initiated 6/29 lung protective vent strategy - FiO2 80%, PEEP 10 cm H2O 7/01 CXR: Tiny left apical pneumothorax. Small right apical pneumothorax. Suspected pneumomediastinum. Associated subcutaneous emphysema. Bilateral pigtail pleural catheters placed. F/U CXR report: New bilateral chest tubes. Minimal residual pneumothorax on the right with no convincing  residual pneumothorax on the left 7/02 worsening subq air,  worsening ABG,  NMB initiated 7/03 improved resp acidosis. High dose methlypred initiated - 1000 mg dail X 3. Insulin gtt initiated for severe hyperglycemia 7/05 methylpred dose reduced to 80 mg q 8 hrs 7/05 ID consultation requested: Discontinue ceftazidime and micafungin and observe off of anabiotic for now. Check serum LDH (elevated at 740) 7/06 L pleural cath migrated out of thoracic cavity. 28 FR chest tube placed on L. Severe pleural adhesions noted 7/06 CT chest: Extensive pneumomediastinum. Diffuse chest wall subcutaneous emphysema. Small left pneumothorax. The side port for the left chest tube may be within the chest wall and outside of the left pleural space. Pneumoperitoneum. Interstitial and airspace opacities compatible with ARDS  7/07 AM: worsening subQ emphysema. R chest tube placed with some difficulty - difficult to advance 7/07 Rituximab ordered after phone consultation with rheumatology (Dr Nickola MajorHawkes) 7/08 Overall unchanged with working dx of dermatomyositis, severe pneumonitis/ARDS requiring high level of vent support, persistent SQ emphysema and pneumomediastinum, hypernatremia, hyperkalemia (despite furosemide), severe hyperglycemia requiring insulin gtt. Hemodynamically stable. Remained on ARDS protocol. The R chest tube had migrated out of thoracic space and was removed. Rituximab (first dose) administered. Furosemide continued remained off all abx per ID recommendations. 2nd pneumocystis smear sent. Discussed with Dr Kendrick FriesMcQuaid and we agreed that [facility] where he can receive rheumatology consultation. Dr Sung AmabileSimonds discussed with wife who was agreeable [facility]. Awaiting bed availability 7/09 Increased (10%) PTX on L. A second chest tube was placed on L. Developed episode of hypertension and tachycardia treated with hydralazine and metoprolol. Subsequently  developed AFRVR. Diltiazem infusion initiated  INDWELLING DEVICES:: ETT 6/28 >>  R IJ CVL 6/28 >>  R radial A-line 6/30 >>  L chest tube (inferior) 7/06 >>  L chest tube (superior) 7/09 >>   MICRO: MRSA PCR (nasal swab) 6/28 >> POS Blood 6/28 > NEG BAL 6/28 > no yeast, cx in progress      > AFB smear neg, cx in progress      > Pneumocystis smear > NEG      > resp virus panel > NEG      > nonpathogenic oropharyngeal flora U. Strep Ag 6/28 > negative U. Legionella Ag 6/28 > negative Pneumocystis smear 7/08 >> pending  ANTIBIOTICS Vanc 6/28 >> 7/04 Ceftaz 6/28 >> 7/05 mycafungin 70/2 >> 7/05  CURRENT MEDS: Scheduled Meds: . antiseptic oral rinse  7 mL Mouth Rinse QID  . artificial tears  1 application Both Eyes 3 times per day  . chlorhexidine  15 mL Mouth Rinse BID  . famotidine  20 mg Per Tube BID  . fentaNYL (SUBLIMAZE) injection  50 mcg Intravenous Once  . folic acid  1 mg Per Tube Daily  . free water  300 mL Per Tube 3 times per day  . furosemide  40 mg Intravenous Q6H  . heparin  5,000 Units Subcutaneous 3 times per day  . insulin glargine  40 Units Subcutaneous BID  . methylPREDNISolone (SOLU-MEDROL) injection  80 mg Intravenous 3 times per day  . midazolam  2 mg Intravenous Once  . [START ON 03/13/2015] riTUXimab (RITUXAN)  NON-ONCOLOGY  infusion  1,000 mg Intravenous Q14 Days  . sennosides  10 mL Per Tube BID   Continuous Infusions: . dextrose 50 mL/hr at 03/01/15 0824  . diltiazem (CARDIZEM) infusion 10 mg/hr (03/01/15 1226)  . feeding supplement (VITAL AF 1.2 CAL) 1,000 mL (02/28/15 1202)  .  fentaNYL infusion INTRAVENOUS 400 mcg/hr (03/01/15 0758)  . insulin (NOVOLIN-R) infusion Stopped (03/01/15 1156)  . Midazolam (Versed) 100mg  in sodium chloride 0.9% 100 mL infusion 10 mL/hr at 03/01/15 0637   PRN Meds:.acetaminophen, fentaNYL, hydrALAZINE, levalbuterol, metoprolol, midazolam, vecuronium  LAB RESULTS: BMET    Component Value Date/Time   NA 143  03/01/2015 0553   K 5.7* 03/01/2015 0553   CL 108 03/01/2015 0553   CO2 29 03/01/2015 0553   GLUCOSE 155* 03/01/2015 0553   BUN 73* 03/01/2015 0553   CREATININE 1.32* 03/01/2015 0553   CALCIUM 7.7* 03/01/2015 0553   GFRNONAA 57* 03/01/2015 0553   GFRAA >60 03/01/2015 0553    CBC    Component Value Date/Time   WBC 17.2* 03/01/2015 0500   RBC 3.34* 03/01/2015 0500   HGB 8.5* 03/01/2015 0500   HCT 28.8* 03/01/2015 0500   PLT 302 03/01/2015 0500   MCV 86.2 03/01/2015 0500   MCH 25.4* 03/01/2015 0500   MCHC 29.5* 03/01/2015 0500   RDW 20.5* 03/01/2015 0500   LYMPHSABS 0.5* 03/01/2015 0500   MONOABS 0.5 03/01/2015 0500   EOSABS 0.0 03/01/2015 0500   BASOSABS 0.0 03/01/2015 0500    CURRENT VENT SETTINGS: PRVC 480 cc/ Rate 35/ PEEP 12/ FiO2 70%  Billy Fischer, MD;  PCCM service; Mobile [phone]

## 2015-03-01 NOTE — Progress Notes (Signed)
RT note- increased fio2 and peep, sp02 83%.

## 2015-03-01 NOTE — Progress Notes (Signed)
1150 Patient converted from NSR/ST rate of 100 to Atrial Flutter/Atrial Fibrillation rate of 161. EKG obtained. Dr. Sung AmabileSimonds notified. Cardizem ordered. 15mg  loading infusing to be given, followed by 10mg  initial starting dose. Ordered to titrated appropriately with max dose 20mg . Will continue to monitor patient approprietly.

## 2015-03-01 NOTE — Progress Notes (Signed)
Report called to Judeth CornfieldStephanie, RN [facility]E [facility]. All questions answered. Will call when patient is leaving hospital with carelink. Family at bedside and updated. [facility] called with report.

## 2015-03-01 NOTE — Progress Notes (Signed)
RT note- [facility]

## 2015-03-01 NOTE — Procedures (Signed)
Chest Tube Insertion Procedure Note  Indications:  Clinically significant Pneumothorax  Pre-operative Diagnosis: Pneumothorax  Post-operative Diagnosis: Pneumothorax  Procedure Details  Informed consent was obtained for the procedure, including sedation.  Risks of lung perforation, hemorrhage, arrhythmia, and adverse drug reaction were discussed.   After sterile skin prep, using standard technique, a 28 French tube was placed in the left anterior 6th rib space.  Findings: None  Estimated Blood Loss:  Minimal         Specimens:  None              Complications:  None; patient tolerated the procedure well.         Disposition: ICU - intubated and critically ill.         Condition: stable  Attending Attestation: I performed the procedure.   CXR pending. Mild bubbling in pleurevac chamber   Billy Fischeravid Simonds, MD ; [facility] Mobile [phone].  After 5:30 PM or weekends, call [phone]

## 2015-03-01 NOTE — Progress Notes (Signed)
1649 Patient placed on carelink monitor, 5mg  of Metoprolol and 10mg  of Vecuronium given for transport. All questions answered for transport purposes. Family updated at bedside.  MD / [facility] Corn[facility]link notified of patient departure. [facility] called and aware of patient arrival.

## 2015-03-03 LAB — PNEUMOCYSTIS JIROVECI SMEAR BY DFA: PNEUMOCYSTIS JIROVECI AG: NEGATIVE

## 2015-03-06 LAB — FUNGUS CULTURE W SMEAR: FUNGAL SMEAR: NONE SEEN

## 2015-03-20 ENCOUNTER — Inpatient Hospital Stay: Payer: 59 | Admitting: Pulmonary Disease

## 2015-03-20 LAB — FUNGUS CULTURE W SMEAR: Fungal Smear: NONE SEEN

## 2015-03-21 LAB — AFB CULTURE WITH SMEAR (NOT AT ARMC): Acid Fast Smear: NONE SEEN

## 2015-03-24 DEATH — deceased

## 2015-04-03 LAB — AFB CULTURE WITH SMEAR (NOT AT ARMC): Acid Fast Smear: NONE SEEN

## 2016-06-10 IMAGING — CT CT CHEST W/ CM
1 of 5 series · 2 of 36 positions shown, 3 images · IV contrast (Iodine)
Comparison: Renal ultrasound performed 06/26/2013, and MRI of the
lumbar spine performed 05/19/2013

CLINICAL DATA: Acute onset of generalized body aches and fever.
Initial encounter.

EXAM:
CT CHEST, ABDOMEN, AND PELVIS WITH CONTRAST
TECHNIQUE: Multidetector CT imaging of the chest, abdomen and pelvis was
performed following the standard protocol during bolus
administration of intravenous contrast.
CONTRAST:  100mL OMNIPAQUE IOHEXOL 300 MG/ML  SOLN

[Series 206: coronals · coronal · 0.50mm/px · 2 of 135 slices shown, 3 images]
[im 54/135  mediastinal]
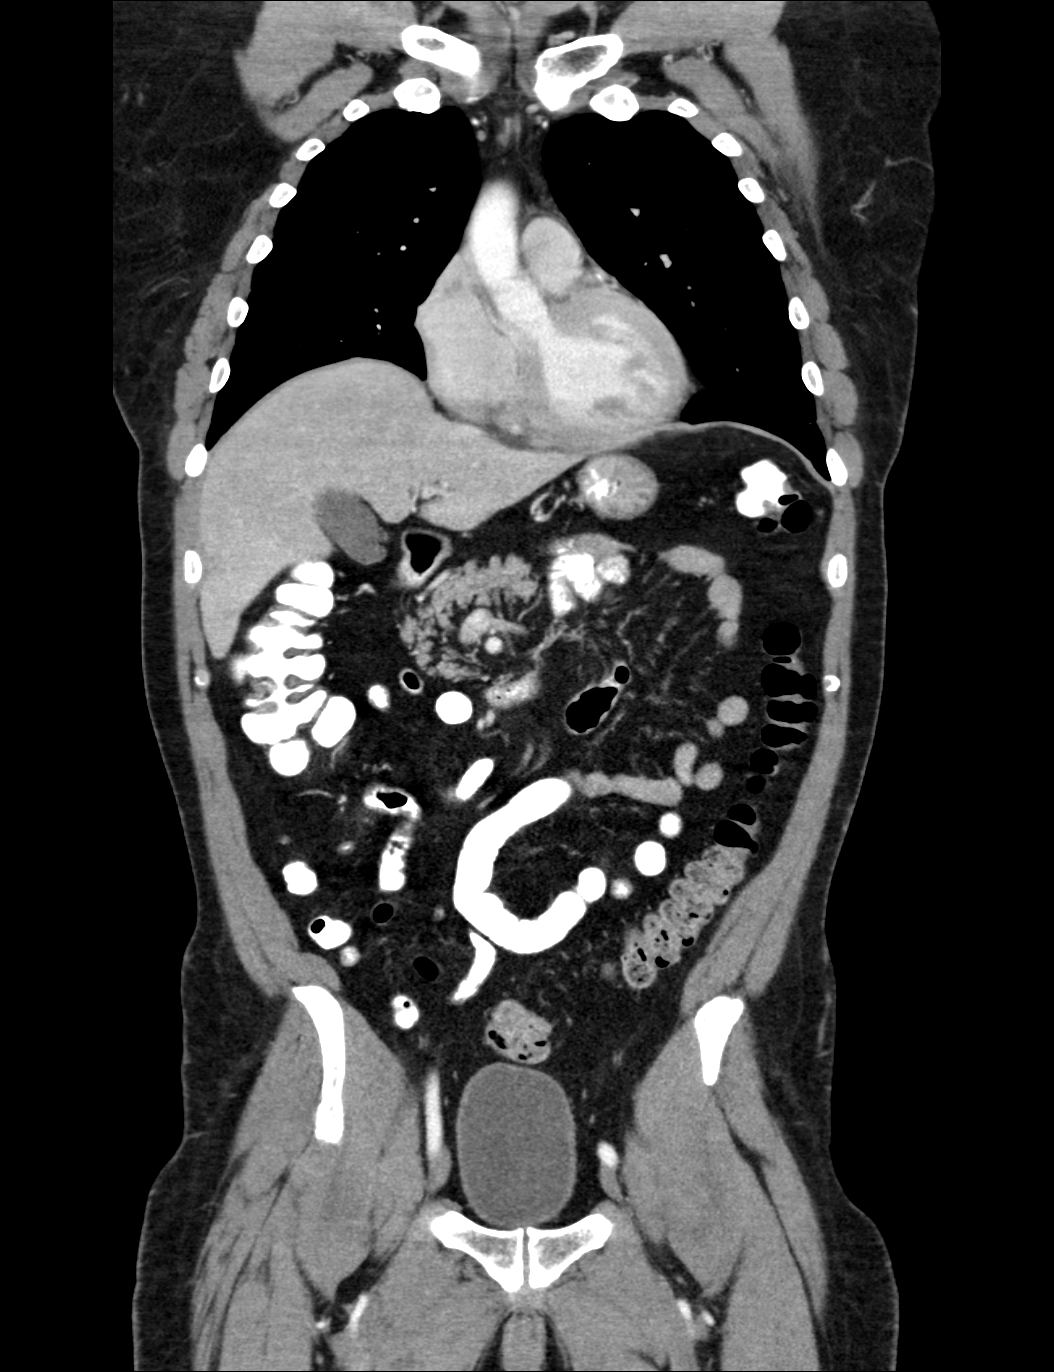
[im 54/135  lung]
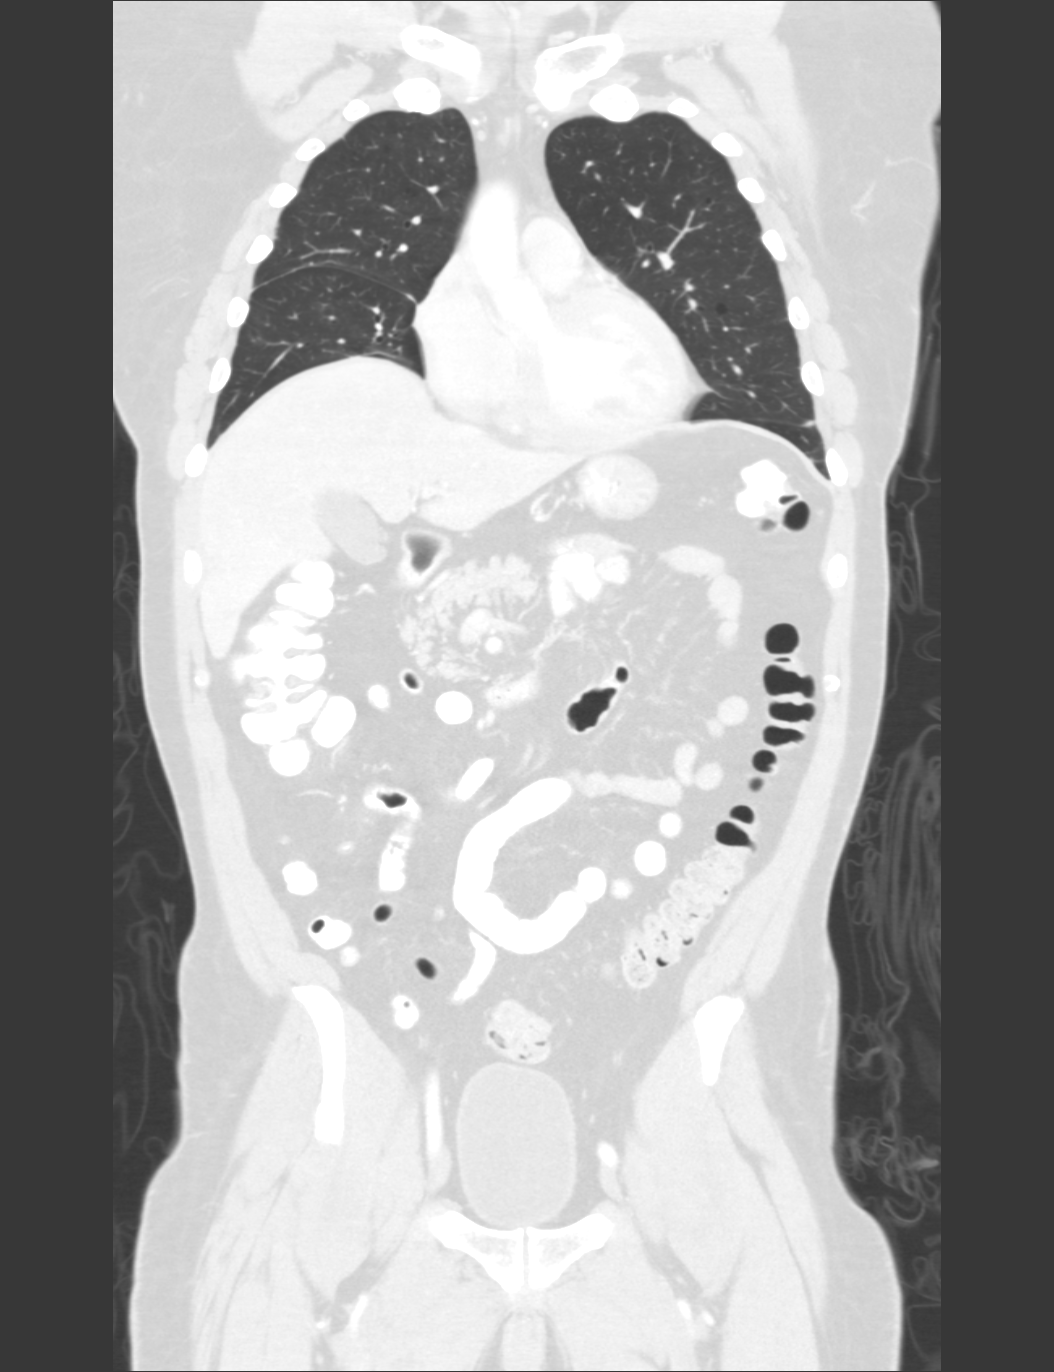
[im 108/135  lung]
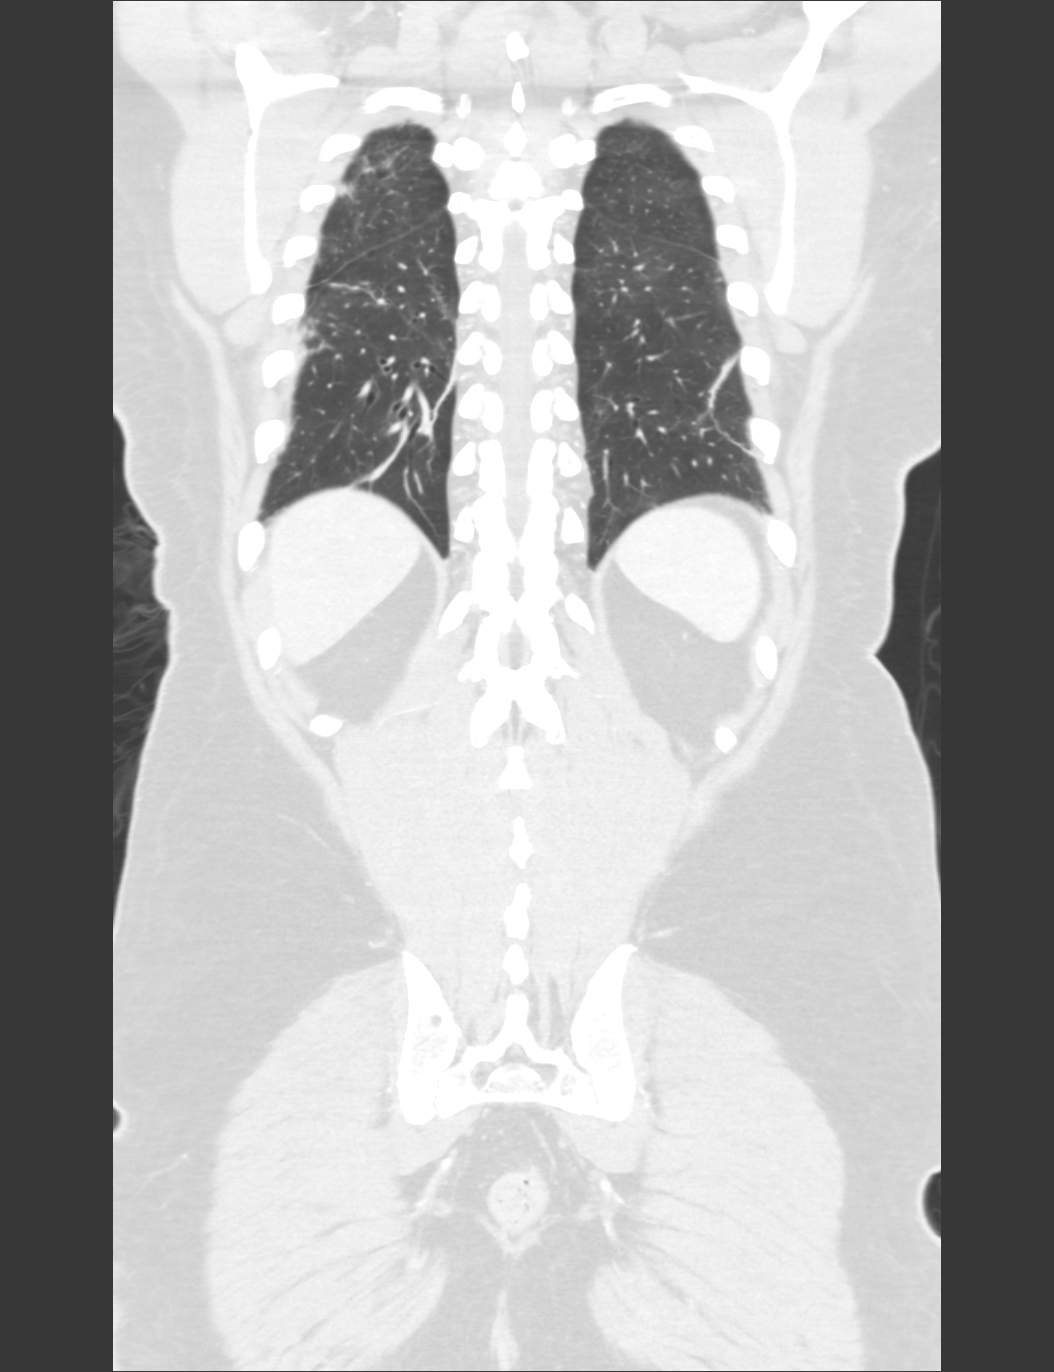

[2 of 36 positions shown; findings below may reference images not displayed]

FINDINGS: CT CHEST FINDINGS

Mild bibasilar atelectasis or scarring is noted. Scattered blebs are
noted within the right upper lobe, and to a lesser extent within the
left upper lobe. There is nonspecific minimal peripheral haziness
within both lungs, with a ring of peripheral sparing. No pleural
effusion or pneumothorax is seen. No mass is identified.

Scattered coronary artery calcification is seen. The mediastinum is
otherwise unremarkable. Visualized mediastinal nodes remain normal
in size. No pericardial effusion is identified. The great vessels
are grossly unremarkable in appearance. The visualized portions of
thyroid gland are unremarkable. No axillary lymphadenopathy is seen.

No acute osseous abnormalities are identified.

CT ABDOMEN AND PELVIS FINDINGS

A calcified granuloma is noted within the right hepatic lobe. The
liver and spleen are otherwise unremarkable in appearance for. The
gallbladder is within normal limits. The pancreas and adrenal glands
are unremarkable.

Bilateral renal scarring is noted. Mild nonspecific perinephric
stranding is noted bilaterally. The kidneys are otherwise
unremarkable. There is no evidence of hydronephrosis. No renal or
ureteral stones are seen.

No free fluid is identified. The small bowel is unremarkable in
appearance. The stomach is within normal limits. No acute vascular
abnormalities are seen. Scattered calcification is noted along the
common iliac arteries bilaterally.

The appendix is normal in caliber, without evidence for
appendicitis. Contrast progresses to the level of the distal
descending colon. The colon is unremarkable in appearance.

The bladder is mildly distended and grossly unremarkable. The
prostate remains normal in size. No inguinal lymphadenopathy is
seen.

No acute osseous abnormalities are identified.
IMPRESSION: 1. No acute abnormality seen to explain the patient's symptoms.
2. Nonspecific minimal peripheral haziness noted within both lungs,
with a ring of peripheral sparing. This is of uncertain
significance. Mild bibasilar atelectasis or scarring noted.
Scattered blebs within the upper lobes, more prominent on the right.
3. Scattered coronary artery calcifications seen.
4. Scattered calcification along the common iliac arteries
bilaterally.

## 2016-10-03 IMAGING — CR DG CHEST 1V PORT
1 series · 1 of 1 positions shown · non-contrast
Comparison: Six[REDACTED]

CLINICAL DATA: Respiratory distress

EXAM:
PORTABLE CHEST - 1 VIEW

[AP]
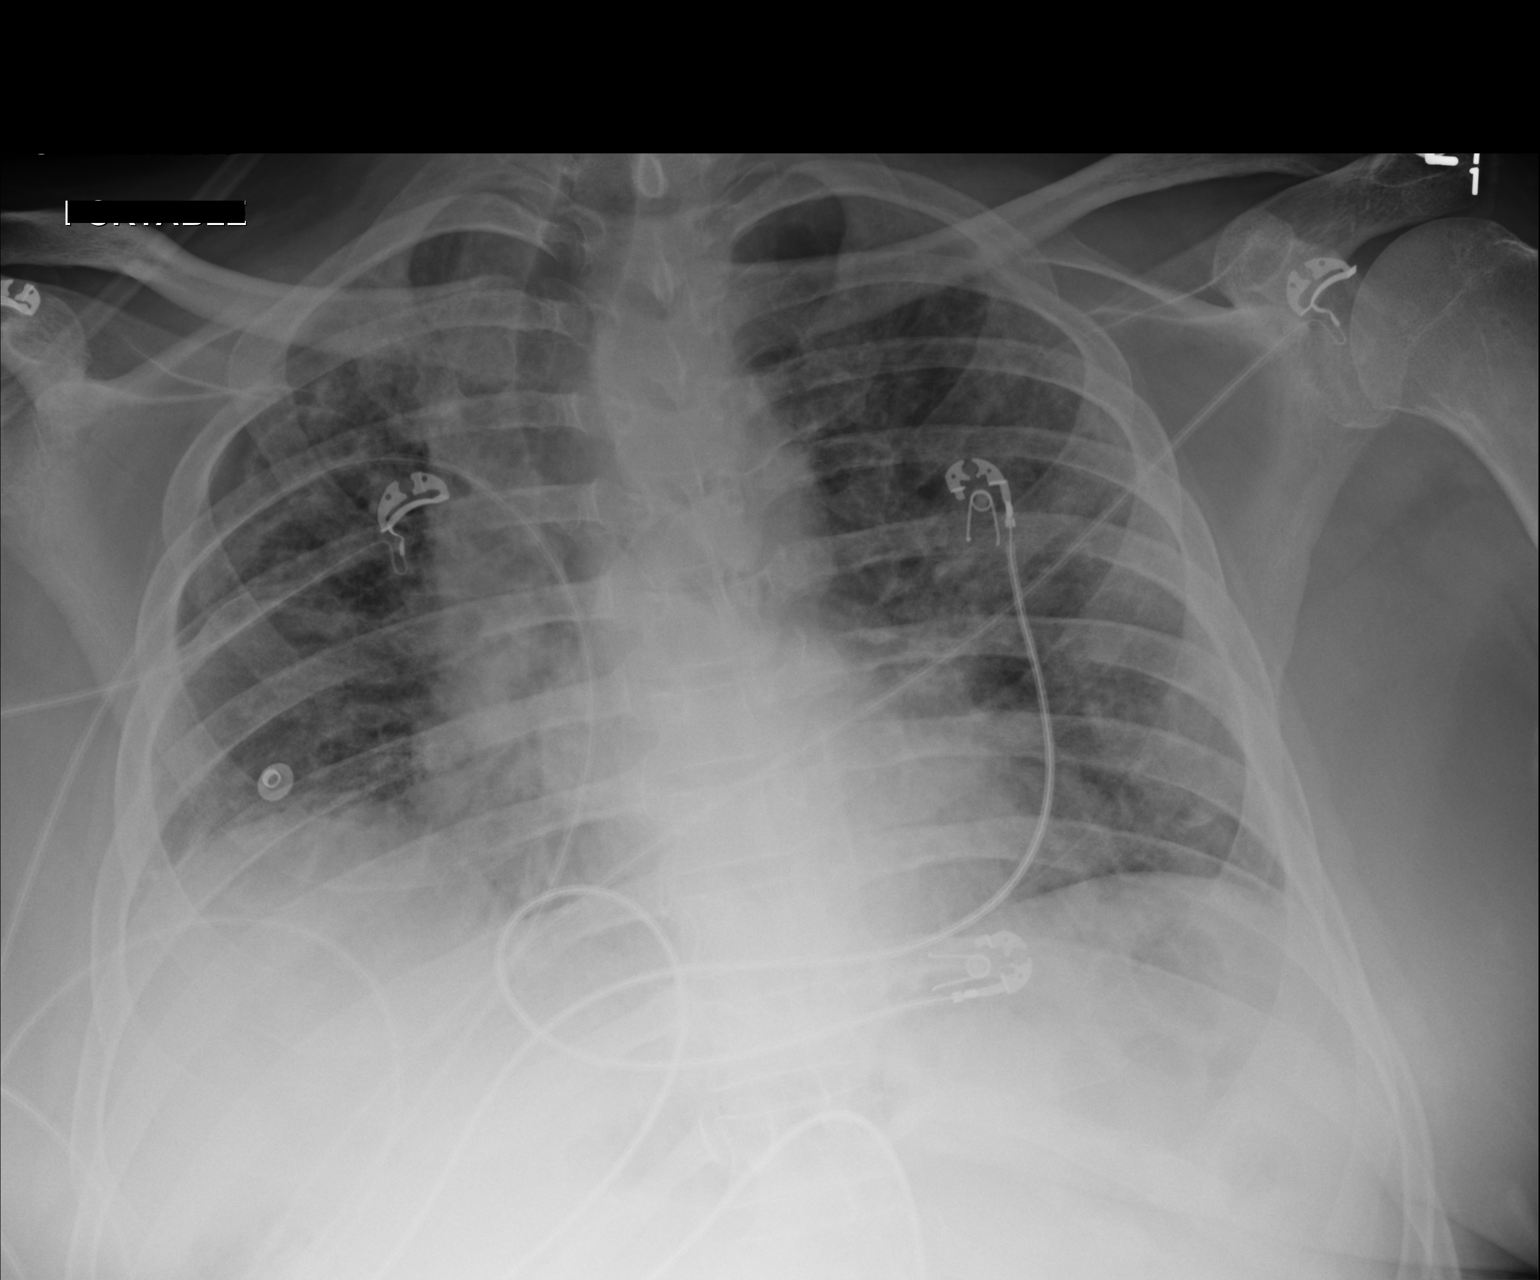

[1 of 1 positions shown; findings below may reference images not displayed]

FINDINGS: There is worsening in aeration with patchy airspace opacification
bilaterally. Findings suspicious for bilateral multifocal asymmetric
pneumonia or pulmonary edema. Clinical correlation is necessary.
IMPRESSION: Worsening in aeration. Patchy airspace disease bilateral upper lobes
and mild interstitial prominence bilaterally. Findings suspicious
for multifocal pneumonia or pulmonary edema. Clinical correlation is
necessary

## 2016-10-05 IMAGING — CR DG CHEST 1V PORT
1 series · 1 of 1 positions shown · non-contrast
Comparison: 02/20/2015

CLINICAL DATA: Status post central line placement

EXAM:
PORTABLE CHEST - 1 VIEW

[AP]
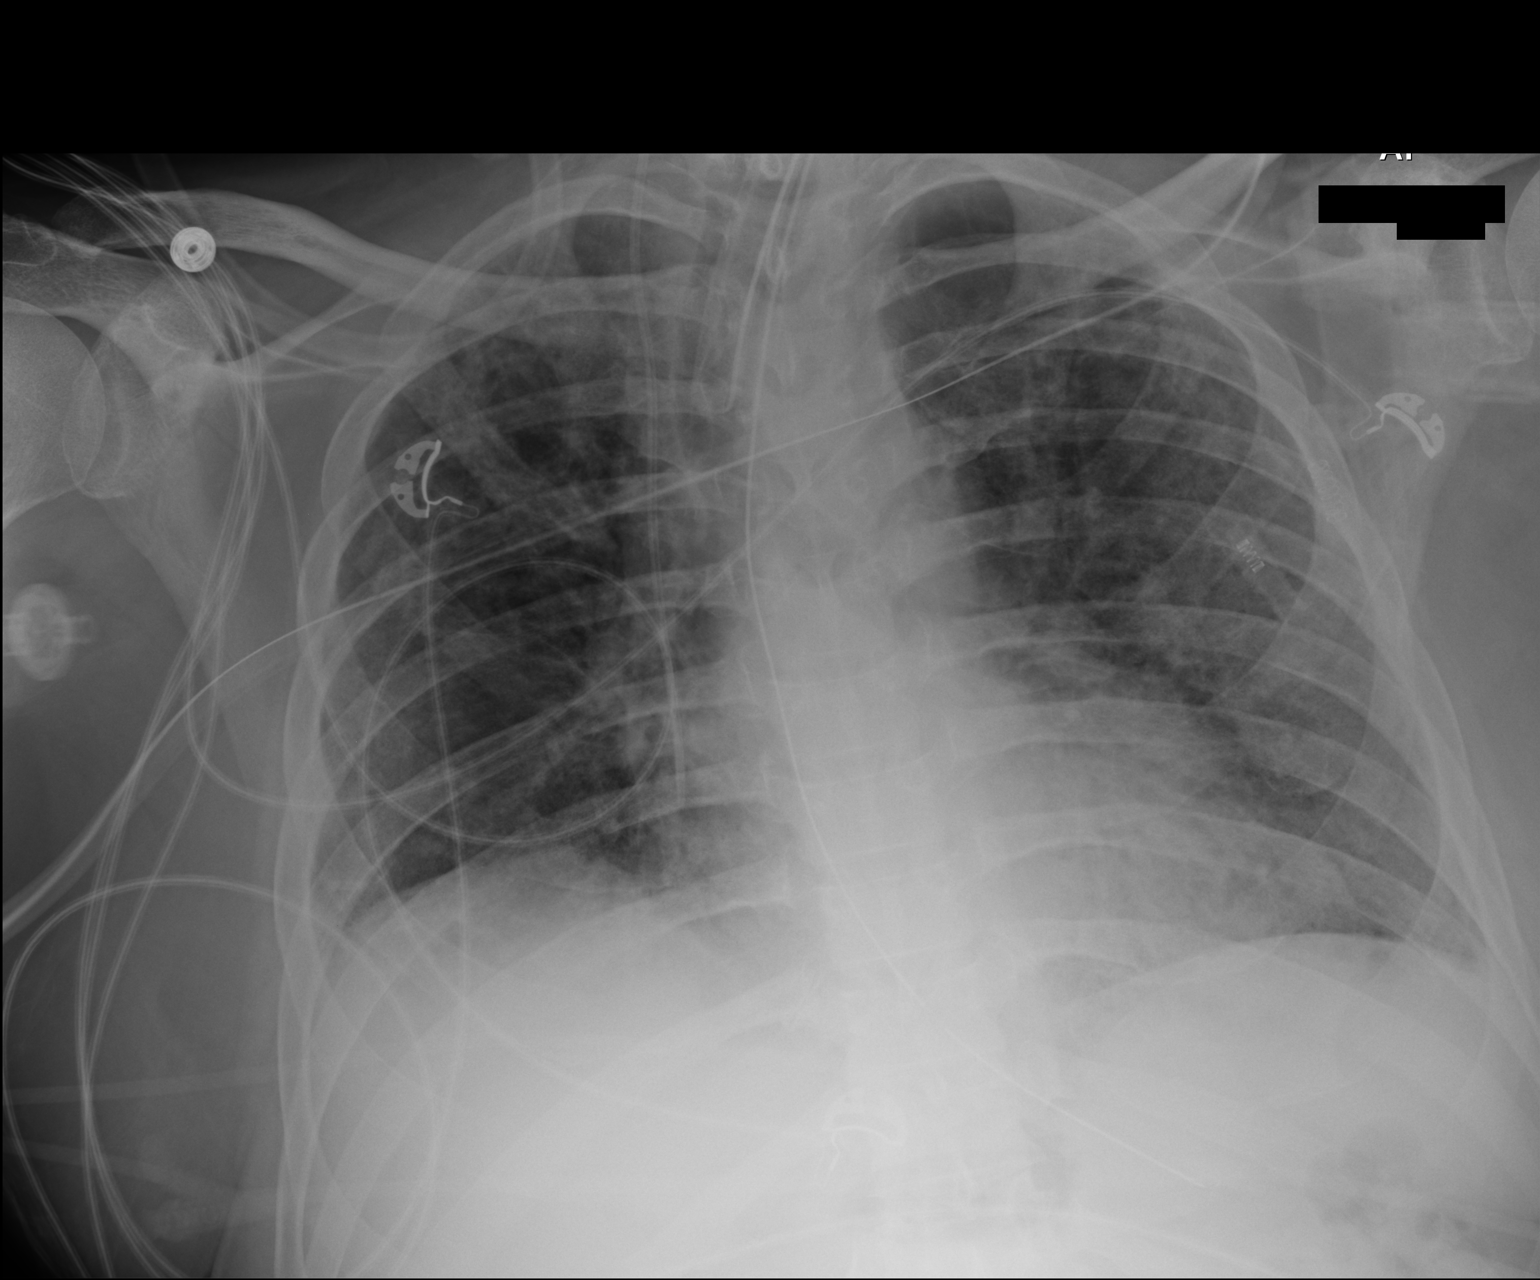

[1 of 1 positions shown; findings below may reference images not displayed]

FINDINGS: Endotracheal tube and nasogastric catheter are again identified and
stable. A new right jugular central line is noted with the catheter
tip in the superior aspect of the right atrium. This could be
withdrawn 1 cm as clinically indicated. No pneumothorax is seen.
Patchy airspace opacities are again identified and slightly improved
when compared with the prior exam. This may be related to improved
aeration.
IMPRESSION: No pneumothorax following central line placement.

Tubes and lines as described above.

Improved aeration bilaterally.
